# Patient Record
Sex: Male | Born: 1985 | Race: Black or African American | Hispanic: No | Marital: Single | Smoking: Current every day smoker
Health system: Southern US, Community
[De-identification: ages and names within clinical notes are randomized; demographics above are authoritative.]

## PROBLEM LIST (undated history)

## (undated) DIAGNOSIS — Z21 Asymptomatic human immunodeficiency virus [HIV] infection status: Secondary | ICD-10-CM

## (undated) DIAGNOSIS — F32A Depression, unspecified: Secondary | ICD-10-CM

## (undated) DIAGNOSIS — B20 Human immunodeficiency virus [HIV] disease: Secondary | ICD-10-CM

## (undated) DIAGNOSIS — F329 Major depressive disorder, single episode, unspecified: Secondary | ICD-10-CM

## (undated) DIAGNOSIS — F319 Bipolar disorder, unspecified: Secondary | ICD-10-CM

## (undated) HISTORY — PX: LUNG BIOPSY: SHX232

## (undated) NOTE — Unmapped External Note (Signed)
 Formatting of this note might be different from the original. Patient complains of ABD pain with N/V/D since this morning. Patient states that he drank something this morning that upset his stomach.  Electronically signed by Redell Stark, RN at 10/19/2023  3:40 PM PDT

## (undated) NOTE — ED Provider Notes (Signed)
 Formatting of this note is different from the original. [facility] EMERGENCY [address] [place] [address] [phone]  History Chief Complaint  Patient presents with   Vomiting   Diarrhea   Nausea    Patient states he drank something earlier and now has N/V/D.     Dustin Bennett is a 24 y.o. male who presents to the ED with nausea, vomiting,and diarrhea prior to arrival.  Patient states he found a bottle of Dr. Nunzio that was already open and drank it.  Thinks he might have been poisoned.  No blood in emesis or stools.  Has mild abdominal cramping.  Denies drug use or alcohol use.  No recent surgeries.  No chest pain, shortness of breath.  No other symptoms or complaints.  The history is provided by the patient. No language interpreter was used.  Vomiting Severity:  Moderate Timing:  Constant Quality:  Unable to specify Progression:  Unchanged Chronicity:  New Relieved by:  Nothing Worsened by:  Nothing Ineffective treatments:  None tried Associated symptoms: abdominal pain and diarrhea   Associated symptoms: no chills, no cough, no fever and no headaches   Risk factors: suspect food intake   Diarrhea Associated symptoms: abdominal pain and vomiting   Associated symptoms: no chills, no fever and no headaches   Nausea Associated symptoms include abdominal pain, nausea and vomiting. Pertinent negatives include no chest pain, chills, congestion, coughing, fever, headaches, neck pain or rash.   Past Medical History:  Diagnosis Date   Depression    HIV (human immunodeficiency virus infection) (CMS/HCC)    History reviewed. No pertinent surgical history.  History reviewed. No pertinent family history.  Social History   Tobacco Use   Smoking status: Every Day   Smokeless tobacco: Never  Vaping Use   Vaping status: never used  Substance Use Topics   Alcohol use: Not Currently   Drug use: Not Currently   No Known Allergies  Review of Systems   Constitutional:  Negative for chills and fever.  HENT:  Negative for congestion and rhinorrhea.   Respiratory:  Negative for cough and shortness of breath.   Cardiovascular:  Negative for chest pain.  Gastrointestinal:  Positive for abdominal pain, diarrhea, nausea and vomiting.  Musculoskeletal:  Negative for back pain and neck pain.  Skin:  Negative for rash.  Neurological:  Negative for light-headedness and headaches.  All other systems reviewed and are negative.  Physical Exam BP 117/83 (BP Location: Left arm, Patient Position: Sitting)   Pulse (!) 111   Temp 98.3 F (36.8 C) (Oral)   Resp 18   SpO2 96%   Physical Exam Vitals and nursing note reviewed.  Constitutional:      General: He is awake. He is in acute distress (mild).     Appearance: He is well-developed. He is not toxic-appearing.     Comments: (+)  No active vomiting at this time.  (+) Patient seen walking in and out of the waiting room to triage with a steady ambulatory gait.  HENT:     Head: Normocephalic and atraumatic.     Nose: Nose normal.     Mouth/Throat:     Pharynx: Uvula midline. No oropharyngeal exudate.  Eyes:     Conjunctiva/sclera: Conjunctivae normal.  Cardiovascular:     Rate and Rhythm: Regular rhythm. Tachycardia present.     Heart sounds: Normal heart sounds.  Pulmonary:     Effort: Pulmonary effort is normal. No respiratory distress.  Breath sounds: Normal breath sounds. No decreased breath sounds, wheezing, rhonchi or rales.  Chest:     Chest wall: No tenderness.  Abdominal:     General: Bowel sounds are normal. There is no distension.     Palpations: Abdomen is soft.     Tenderness: There is no abdominal tenderness. There is no guarding or rebound.  Musculoskeletal:        General: No tenderness or deformity. Normal range of motion.     Cervical back: Normal range of motion.  Skin:    General: Skin is warm and dry.     Findings: No rash.  Neurological:     Mental Status: He  is alert and oriented to person, place, and time.  Psychiatric:        Mood and Affect: Mood is anxious.   Results Labs Reviewed  URINE DRUG SCREEN - Abnormal      Result Value   Cocaine Negative     Amphetamine Positive (*)    Opiate Negative     Barbiturate Negative     Cannabinoid Positive (*)    Benzodiazepine Negative     Phencyclidine Negative     Fentanyl Screen, Urine Negative     Narrative:    This test provides only a preliminary test result for testing in human urine samples. A more specific alternate chemical method must be used in order to obtain a confirmed analytical result. Gas chromatography/mass spectrometry (GC/MS) is the preferred confirmatory method. Other chemical confirmation Methods are available. Clinical consideration and professional judgment should be applied to any drug abuse test result, particularly when positive results are used. This test is a screening test and should only be used for medical purposes.   DRUG                             CUTOFF  Amphetamine                      1000 ng/mL Cocaine                           300 ng/mL Opiates                           300 ng/mL PCP                                25 ng/mL Barbituates                       200 ng/mL  Benzodiazepine                    200 ng/mL THC                                50 ng/mL Fentanyl                            1 ng/mL  CBC W/ AUTO DIFF  BASIC METABOLIC PANEL  LIPASE, SERUM  ETHANOL   Imaging Results   None   Interpreted by radiologist.   ED Course Procedures  MDM Number of Diagnoses or Management Options Eloped from emergency department  Nausea & vomiting Diagnosis management comments: MDM: The history is provided by the patient.   Dustin Bennett is a 81 y.o. male who presents to the ED with nausea vomiting diarrhea prior to arrival.  Patient states he found a bottle of Dr. Nunzio that was already open and drank it.  Thinks he might have been poisoned.  No  blood in emesis or stools.  Has mild abdominal cramping.  Denies drug use or alcohol use.  No recent surgeries.  No chest pain, shortness of breath.  No other symptoms or complaints.  Physical Exam:  Mild distress.  Anxious.  Soft and nontender abdomen.  No active vomiting at this time.  Patient seen walking in and out of the waiting room to triage with a steady ambulatory gait.  Clear breath sounds, no respiratory distress, mildly tachycardic.  4:13 PM Per records reviewed: patient was last seen in the ED on 03/26/2020 by Hadassah Finder, APRN, dx includes heat exhaustion, hypokalemia.  I initiated the workup on the patient of chief complaint of NVD. We began appropriate case-specific diagnostic work up. While pending, I came back to the pod and saw they had left without waiting. They appeared competent at the time they were seen. I cannot say why they left, but they did without being further evaluated. We will try calling them at their house to better identify why they left before we were able to make a diagnosis.    ED Disposition  Data Unavailable  Final diagnoses:  Nausea & vomiting  Eloped from emergency department   New Prescriptions   No medications on file   Scribe Attestation: Documenting assistance provided by Dustin Bennett, Medical Scribe for Dustin Cruel, APRN   I personally performed the services described in the documentation.  I have reviewed and edited as indicated in the documentation which was dictated to the scribe in my presence.  This documentation accurately records my words and actions.  Dustin Cruel, APRN    Dustin JINNY Cruel, NP 10/19/23 TRENNA  Electronically signed by Prentice FORBES Cone, DO at 10/20/2023  5:18 PM PDT  Associated attestation - Cone Prentice FORBES, DO - 10/20/2023  5:18 PM PDT Formatting of this note might be different from the original. The patient was reviewed with the midlevel provider along with available database at the time of disposition.  I  agree with the dispositon as discussed at the time of our discussion.  AE Cone, DO [facility]

## (undated) NOTE — ED Notes (Signed)
 Formatting of this note might be different from the original. Pt called multiple times from ER lobby, no answer. Pt eloped, provider notified.\ Electronically signed by Arlyne Lanette Breed, RN at 10/19/2023  6:31 PM PDT

---

## 2009-03-10 ENCOUNTER — Emergency Department: Payer: Self-pay | Admitting: Emergency Medicine

## 2009-03-12 ENCOUNTER — Emergency Department: Payer: Self-pay | Admitting: Internal Medicine

## 2014-05-29 ENCOUNTER — Emergency Department: Payer: Self-pay | Admitting: Student

## 2014-05-29 LAB — URINALYSIS, COMPLETE
BLOOD: NEGATIVE
Bacteria: NONE SEEN
Bilirubin,UR: NEGATIVE
Glucose,UR: NEGATIVE mg/dL (ref 0–75)
Ketone: NEGATIVE
LEUKOCYTE ESTERASE: NEGATIVE
Nitrite: NEGATIVE
PROTEIN: NEGATIVE
Ph: 6 (ref 4.5–8.0)
Specific Gravity: 1.023 (ref 1.003–1.030)
Squamous Epithelial: NONE SEEN

## 2014-05-29 LAB — GC/CHLAMYDIA PROBE AMP

## 2014-05-31 LAB — URINE CULTURE

## 2015-07-25 ENCOUNTER — Emergency Department
Admission: EM | Admit: 2015-07-25 | Discharge: 2015-07-25 | Disposition: A | Discharge status: Home / Self Care | Payer: Self-pay | Attending: Emergency Medicine | Admitting: Emergency Medicine

## 2015-07-25 DIAGNOSIS — F19959 Other psychoactive substance use, unspecified with psychoactive substance-induced psychotic disorder, unspecified: Secondary | ICD-10-CM

## 2015-07-25 DIAGNOSIS — Z21 Asymptomatic human immunodeficiency virus [HIV] infection status: Secondary | ICD-10-CM

## 2015-07-25 DIAGNOSIS — F1420 Cocaine dependence, uncomplicated: Secondary | ICD-10-CM

## 2015-07-25 DIAGNOSIS — F1510 Other stimulant abuse, uncomplicated: Secondary | ICD-10-CM

## 2015-07-25 DIAGNOSIS — F17200 Nicotine dependence, unspecified, uncomplicated: Secondary | ICD-10-CM | POA: Insufficient documentation

## 2015-07-25 DIAGNOSIS — F29 Unspecified psychosis not due to a substance or known physiological condition: Secondary | ICD-10-CM | POA: Insufficient documentation

## 2015-07-25 DIAGNOSIS — F1220 Cannabis dependence, uncomplicated: Secondary | ICD-10-CM

## 2015-07-25 DIAGNOSIS — F1290 Cannabis use, unspecified, uncomplicated: Secondary | ICD-10-CM | POA: Insufficient documentation

## 2015-07-25 DIAGNOSIS — F1490 Cocaine use, unspecified, uncomplicated: Secondary | ICD-10-CM | POA: Insufficient documentation

## 2015-07-25 DIAGNOSIS — F22 Delusional disorders: Secondary | ICD-10-CM | POA: Insufficient documentation

## 2015-07-25 LAB — CBC WITH DIFFERENTIAL/PLATELET
BASOS PCT: 1 %
Basophils Absolute: 0 10*3/uL (ref 0–0.1)
EOS ABS: 0.1 10*3/uL (ref 0–0.7)
EOS PCT: 2 %
HCT: 47.8 % (ref 40.0–52.0)
HEMOGLOBIN: 16 g/dL (ref 13.0–18.0)
LYMPHS ABS: 0.7 10*3/uL — AB (ref 1.0–3.6)
Lymphocytes Relative: 24 %
MCH: 30.5 pg (ref 26.0–34.0)
MCHC: 33.4 g/dL (ref 32.0–36.0)
MCV: 91.2 fL (ref 80.0–100.0)
MONOS PCT: 17 %
Monocytes Absolute: 0.5 10*3/uL (ref 0.2–1.0)
NEUTROS PCT: 56 %
Neutro Abs: 1.6 10*3/uL (ref 1.4–6.5)
PLATELETS: 103 10*3/uL — AB (ref 150–440)
RBC: 5.24 MIL/uL (ref 4.40–5.90)
RDW: 13.2 % (ref 11.5–14.5)
WBC: 2.8 10*3/uL — AB (ref 3.8–10.6)

## 2015-07-25 LAB — URINE DRUG SCREEN, QUALITATIVE (ARMC ONLY)
AMPHETAMINES, UR SCREEN: POSITIVE — AB
BENZODIAZEPINE, UR SCRN: NOT DETECTED
Barbiturates, Ur Screen: NOT DETECTED
Cannabinoid 50 Ng, Ur ~~LOC~~: POSITIVE — AB
Cocaine Metabolite,Ur ~~LOC~~: POSITIVE — AB
MDMA (ECSTASY) UR SCREEN: NOT DETECTED
METHADONE SCREEN, URINE: NOT DETECTED
Opiate, Ur Screen: NOT DETECTED
Phencyclidine (PCP) Ur S: NOT DETECTED
TRICYCLIC, UR SCREEN: NOT DETECTED

## 2015-07-25 LAB — URINALYSIS COMPLETE WITH MICROSCOPIC (ARMC ONLY)
BILIRUBIN URINE: NEGATIVE
Bacteria, UA: NONE SEEN
Glucose, UA: NEGATIVE mg/dL
HGB URINE DIPSTICK: NEGATIVE
LEUKOCYTES UA: NEGATIVE
NITRITE: NEGATIVE
PH: 5 (ref 5.0–8.0)
Protein, ur: 30 mg/dL — AB
SPECIFIC GRAVITY, URINE: 1.023 (ref 1.005–1.030)

## 2015-07-25 LAB — COMPREHENSIVE METABOLIC PANEL
ALBUMIN: 4.7 g/dL (ref 3.5–5.0)
ALK PHOS: 63 U/L (ref 38–126)
ALT: 29 U/L (ref 17–63)
ANION GAP: 10 (ref 5–15)
AST: 50 U/L — ABNORMAL HIGH (ref 15–41)
BUN: 15 mg/dL (ref 6–20)
CHLORIDE: 96 mmol/L — AB (ref 101–111)
CO2: 26 mmol/L (ref 22–32)
Calcium: 9.6 mg/dL (ref 8.9–10.3)
Creatinine, Ser: 1.41 mg/dL — ABNORMAL HIGH (ref 0.61–1.24)
GFR calc non Af Amer: >60 mL/min (ref >60)
GLUCOSE: 87 mg/dL (ref 65–99)
Potassium: 3.8 mmol/L (ref 3.5–5.1)
SODIUM: 132 mmol/L — AB (ref 135–145)
Total Bilirubin: 1.2 mg/dL (ref 0.3–1.2)
Total Protein: 9 g/dL — ABNORMAL HIGH (ref 6.5–8.1)

## 2015-07-25 LAB — SALICYLATE LEVEL

## 2015-07-25 LAB — ACETAMINOPHEN LEVEL: Acetaminophen (Tylenol), Serum: <10 ug/mL — ABNORMAL LOW (ref 10–30)

## 2015-07-25 LAB — ETHANOL

## 2015-07-25 MED ORDER — DIPHENHYDRAMINE HCL 50 MG/ML IJ SOLN
25.0000 mg | Freq: Once | INTRAMUSCULAR | Status: AC
  Administered 2015-07-25: 25 mg via INTRAMUSCULAR

## 2015-07-25 MED ORDER — HALOPERIDOL LACTATE 5 MG/ML IJ SOLN
INTRAMUSCULAR | Status: AC
  Administered 2015-07-25: 5 mg via INTRAMUSCULAR
  Filled 2015-07-25: qty 1

## 2015-07-25 MED ORDER — LORAZEPAM 2 MG/ML IJ SOLN
INTRAMUSCULAR | Status: AC
  Administered 2015-07-25: 2 mg via INTRAMUSCULAR
  Filled 2015-07-25: qty 1

## 2015-07-25 MED ORDER — HALOPERIDOL LACTATE 5 MG/ML IJ SOLN
5.0000 mg | Freq: Once | INTRAMUSCULAR | Status: AC
  Administered 2015-07-25: 5 mg via INTRAMUSCULAR

## 2015-07-25 MED ORDER — LORAZEPAM 2 MG/ML IJ SOLN
2.0000 mg | Freq: Once | INTRAMUSCULAR | Status: AC
  Administered 2015-07-25: 2 mg via INTRAMUSCULAR

## 2015-07-25 MED ORDER — DIPHENHYDRAMINE HCL 50 MG/ML IJ SOLN
INTRAMUSCULAR | Status: AC
  Filled 2015-07-25: qty 1

## 2015-07-25 MED ORDER — DIPHENHYDRAMINE HCL 50 MG/ML IJ SOLN: 50.0000 mg | Freq: Once | INTRAMUSCULAR | Status: DC

## 2015-07-25 NOTE — BHH Counselor (Signed)
Pt received B-52 due to agitation and aggressiveness and is now sleeping. TTS unable to complete assessment at this time.

## 2015-07-25 NOTE — Consult Note (Signed)
[facility] Consult   Reason for Consult:  Consult for this 29 year old man brought here under involuntary commitment filed by his mother with allegations of psychosis and psychotic behavior Referring Physician:  Joni Fears Patient Identification: ESTEVAN KERSH MRN:  [id number] Principal Diagnosis: Drug psychosis [facility]) Diagnosis:   Patient Active Problem List   Diagnosis Date Noted  . 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 Marijuana abuse [F12.10] 07/25/2015  . Amphetamine abuse [F15.10] 07/25/2015  . Cocaine abuse [F14.10] 07/25/2015  . HIV positive ([place]) [Z21] 07/25/2015    Total Time spent with patient: 1 hour  Subjective:   ZEPHAN BEAUCHAINE is a 29 y.o. male patient admitted with patient interviewed. Chart reviewed. Commitment paper reviewed. We discussed the situation among the psychiatric team here and that emergency room. This 29 year old man was brought to the emergency room last night on involuntary commitment paperwork filed by his mother. The commitment paperwork alleges that the patient has had some bizarre behavior presumed to be paranoid such as tearing heating and air conditioning units out of the house cutting electrical cord staring siding off the house. It alleges that the family believes that the patient is on drugs. On presentation last night evidently the patient was quite irritable and upset about being here but it's not specifically documented that he said or did anything psychotic. On interview today the patient is rather resistant. He tells me that all he knows is that his mother took out paperwork. Confronted with the contents of the commitment he either says that they are all untrue or he has an excuse for them saying that he thinks that some of the addition adding was not working and he was trying to work on it. Patient states that his mood is been fine. Denies auditory or visual hallucinations. Denies feeling paranoid. Denies any thoughts of  wanting to harm himself or being aggressive to others. He states that he just recently moved back to [place] from [place] about a month ago. Stress from not currently working. Asked about substance abuse he says that he drinks about twice a week not heavily and smokes marijuana 2 or 3 times a week but denies that he is using any other drugs.  Social history: Has been living with his mother. Moved back here about a month ago from [place]. Not currently working. Not in school. Not really forthcoming with any other clear information about his situation.  Medical history: Patient states he is HIV positive. He was seeing a doctor in [place]. He says he is on Atripla monotherapy as treatment for his disease. Denies any other medical problems. Doesn't have a doctor down here in [place] set up yet.  Substance abuse history: As noted above he admits to use of alcohol and marijuana denies other drug use denies believing that he has a substance abuse problem.  HPI:  See note above) history of present illness. Patient was brought in under involuntary commitment stating some bizarre behavior but not clearly stating dangerousness. Since being here he has denied any psychiatric symptoms.  Past Psychiatric History: Patient admits to me that he has seen a psychiatrist before but claims that it was only because he had HIV disease and was being evaluated in the clinic. Denies he's ever been treated for any mental health problems. Denies ever being in a psychiatric hospital. Denies suicide attempts. Denies being on psychiatric medicine.  Risk to Self: Suicidal Ideation: No Suicidal Intent: No Is patient at risk for suicide?:  No Suicidal Plan?: No Access to Means: No What has been your use of drugs/alcohol within the last 12 months?: Alcohol, THC, Cocaine & Ecstasy How many times?: 0 Other Self Harm Risks: Active Addiction Triggers for Past Attempts: None known Intentional Self Injurious  Behavior: None Risk to Others: Homicidal Ideation: No Thoughts of Harm to Others: No Current Homicidal Intent: No Current Homicidal Plan: No Access to Homicidal Means: No Identified Victim: None Reported History of harm to others?: No Assessment of Violence: None Noted Violent Behavior Description: None Reported Does patient have access to weapons?: No Criminal Charges Pending?: No Does patient have a court date: No Prior Inpatient Therapy: Prior Inpatient Therapy: No Prior Therapy Dates: None Reported Prior Therapy Facilty/Provider(s): None Reported Reason for Treatment: None Reported Prior Outpatient Therapy: Prior Outpatient Therapy: Yes Prior Therapy Dates: 07/2014 [facility] Reason for Treatment: Anxiety & Depression due to health Does patient have an ACCT team?: No Does patient have Intensive In-House Services?  : No Does patient have [facility] services? : No Does patient have P4CC services?: No  Past Medical History: History reviewed. No pertinent past medical history. History reviewed. No pertinent past surgical history. Family History: No family history on file. Family Psychiatric  History: Patient says he has an aunt who had mental health problems but he doesn't know anything else about Social History:  History  Alcohol Use  . 3.6 oz/week  . 6 Cans of beer per week     History  Drug Use  . Yes  . Special: Marijuana    Social History   Social History  . Marital Status: Single    Spouse Name: N/A  . Number of Children: N/A  . Years of Education: N/A   Social History Main Topics  . Smoking status: Current Every Day Smoker  . Smokeless tobacco: None  . Alcohol Use: 3.6 oz/week    6 Cans of beer per week  . Drug Use: Yes    Special: Marijuana  . Sexual Activity: Not Asked   Other Topics Concern  . None   Social History Narrative  . None   Additional Social History:    Pain Medications: See  PTA Prescriptions: See PTA Over the Counter: See PTA History of alcohol / drug use?: Yes Longest period of sobriety (when/how long): "5 years" Negative Consequences of Use:  (Reports of having none) Withdrawal Symptoms:  (Reports of having none) Name of Substance 1: THC 1 - Age of First Use: 15 1 - Amount (size/oz): "2 or 3 blunts" 1 - Frequency: 3 days out of the week 1 - Duration: 2 years 1 - Last Use / Amount: 07/24/2015 Name of Substance 2: Alcohol 2 - Age of First Use: 17 2 - Amount (size/oz): "2 8oz cans" 2 - Frequency: "Every other weekend" 2 - Duration: Approximately a year 2 - Last Use / Amount: 07/23/2015 Name of Substance 3: "Molly"-Ecstasy 3 - Age of First Use: 23 3 - Amount (size/oz): "One pill" 3 - Frequency: Used only 10 times in life time 3 - Duration: Used only 10 tiems in life time 3 - Last Use / Amount: 07/11/2015 Name of Substance 4: Cocaine (Powder) 4 - Age of First Use: 16 4 - Amount (size/oz): 1 gram 4 - Frequency: Approximately 2x a month 4 - Duration: "Not that often" 4 - Last Use / Amount: 07/12/2015             Allergies:  No Known Allergies  Labs:  Results for orders placed or performed during the hospital encounter of 07/25/15 (from the past 48 hour(s))  CBC with Differential     Status: Abnormal   Collection Time: 07/25/15  1:15 AM  Result Value Ref Range   WBC 2.8 (L) 3.8 - 10.6 K/uL   RBC 5.24 4.40 - 5.90 MIL/uL   Hemoglobin 16.0 13.0 - 18.0 g/dL   HCT 47.8 40.0 - 52.0 %   MCV 91.2 80.0 - 100.0 fL   MCH 30.5 26.0 - 34.0 pg   MCHC 33.4 32.0 - 36.0 g/dL   RDW 13.2 11.5 - 14.5 %   Platelets 103 (L) 150 - 440 K/uL   Neutrophils Relative % 56 %   Neutro Abs 1.6 1.4 - 6.5 K/uL   Lymphocytes Relative 24 %   Lymphs Abs 0.7 (L) 1.0 - 3.6 K/uL   Monocytes Relative 17 %   Monocytes Absolute 0.5 0.2 - 1.0 K/uL   Eosinophils Relative 2 %   Eosinophils Absolute 0.1 0 - 0.7 K/uL   Basophils Relative 1 %   Basophils Absolute 0.0 0 - 0.1  K/uL  Comprehensive metabolic panel     Status: Abnormal   Collection Time: 07/25/15  1:15 AM  Result Value Ref Range   Sodium 132 (L) 135 - 145 mmol/L   Potassium 3.8 3.5 - 5.1 mmol/L   Chloride 96 (L) 101 - 111 mmol/L   CO2 26 22 - 32 mmol/L   Glucose, Bld 87 65 - 99 mg/dL   BUN 15 6 - 20 mg/dL   Creatinine, Ser 1.41 (H) 0.61 - 1.24 mg/dL   Calcium 9.6 8.9 - 10.3 mg/dL   Total Protein 9.0 (H) 6.5 - 8.1 g/dL   Albumin 4.7 3.5 - 5.0 g/dL   AST 50 (H) 15 - 41 U/L   ALT 29 17 - 63 U/L   Alkaline Phosphatase 63 38 - 126 U/L   Total Bilirubin 1.2 0.3 - 1.2 mg/dL   GFR calc non Af Amer >60 >60 mL/min   GFR calc Af Amer >60 >60 mL/min    Comment: (NOTE) The eGFR has been calculated using the CKD EPI equation. This calculation has not been validated in all clinical situations. eGFR's persistently <60 mL/min signify possible Chronic Kidney Disease.    Anion gap 10 5 - 15  Acetaminophen level     Status: Abnormal   Collection Time: 07/25/15  1:15 AM  Result Value Ref Range   Acetaminophen (Tylenol), Serum <10 (L) 10 - 30 ug/mL    Comment:        THERAPEUTIC CONCENTRATIONS VARY SIGNIFICANTLY. A RANGE OF 10-30 ug/mL MAY BE AN EFFECTIVE CONCENTRATION FOR MANY PATIENTS. HOWEVER, SOME ARE BEST TREATED AT CONCENTRATIONS OUTSIDE THIS RANGE. ACETAMINOPHEN CONCENTRATIONS >150 ug/mL AT 4 HOURS AFTER INGESTION AND >50 ug/mL AT 12 HOURS AFTER INGESTION ARE OFTEN ASSOCIATED WITH TOXIC REACTIONS.   Salicylate level     Status: None   Collection Time: 07/25/15  1:15 AM  Result Value Ref Range   Salicylate Lvl <6.2 2.8 - 30.0 mg/dL  Ethanol     Status: None   Collection Time: 07/25/15  1:15 AM  Result Value Ref Range   Alcohol, Ethyl (B) <5 <5 mg/dL    Comment:        LOWEST DETECTABLE LIMIT FOR SERUM ALCOHOL IS 5 mg/dL FOR MEDICAL PURPOSES ONLY   Urine Drug Screen, Qualitative ([facility] only)     Status: Abnormal   Collection Time: 07/25/15  1:15 AM  Result Value Ref Range    Tricyclic, Ur Screen NONE DETECTED NONE DETECTED   Amphetamines, Ur Screen POSITIVE (A) NONE DETECTED   MDMA (Ecstasy)Ur Screen NONE DETECTED NONE DETECTED   Cocaine Metabolite,Ur [place] POSITIVE (A) NONE DETECTED   Opiate, Ur Screen NONE DETECTED NONE DETECTED   Phencyclidine (PCP) Ur S NONE DETECTED NONE DETECTED   Cannabinoid 50 Ng, Ur [place] POSITIVE (A) NONE DETECTED   Barbiturates, Ur Screen NONE DETECTED NONE DETECTED   Benzodiazepine, Ur Scrn NONE DETECTED NONE DETECTED   Methadone Scn, Ur NONE DETECTED NONE DETECTED    Comment: (NOTE) 568  Tricyclics, urine               Cutoff 1000 ng/mL 200  Amphetamines, urine             Cutoff 1000 ng/mL 300  MDMA (Ecstasy), urine           Cutoff 500 ng/mL 400  Cocaine Metabolite, urine       Cutoff 300 ng/mL 500  Opiate, urine                   Cutoff 300 ng/mL 600  Phencyclidine (PCP), urine      Cutoff 25 ng/mL 700  Cannabinoid, urine              Cutoff 50 ng/mL 800  Barbiturates, urine             Cutoff 200 ng/mL 900  Benzodiazepine, urine           Cutoff 200 ng/mL 1000 Methadone, urine                Cutoff 300 ng/mL 1100 1200 The urine drug screen provides only a preliminary, unconfirmed 1300 analytical test result and should not be used for non-medical 1400 purposes. Clinical consideration and professional judgment should 1500 be applied to any positive drug screen result due to possible 1600 interfering substances. A more specific alternate chemical method 1700 must be used in order to obtain a confirmed analytical result.  1800 Gas chromato graphy / mass spectrometry (GC/MS) is the preferred 1900 confirmatory method.   Urinalysis complete, with microscopic ([facility] only)     Status: Abnormal   Collection Time: 07/25/15  1:15 AM  Result Value Ref Range   Color, Urine YELLOW (A) YELLOW   APPearance CLEAR (A) CLEAR   Glucose, UA NEGATIVE NEGATIVE mg/dL   Bilirubin Urine NEGATIVE NEGATIVE   Ketones, ur TRACE (A) NEGATIVE mg/dL    Specific Gravity, Urine 1.023 1.005 - 1.030   Hgb urine dipstick NEGATIVE NEGATIVE   pH 5.0 5.0 - 8.0   Protein, ur 30 (A) NEGATIVE mg/dL   Nitrite NEGATIVE NEGATIVE   Leukocytes, UA NEGATIVE NEGATIVE   RBC / HPF 0-5 0 - 5 RBC/hpf   WBC, UA 0-5 0 - 5 WBC/hpf   Bacteria, UA NONE SEEN NONE SEEN   Squamous Epithelial / LPF 0-5 (A) NONE SEEN   Mucous PRESENT     No current facility-administered medications for this encounter.   No current outpatient prescriptions on file.    Musculoskeletal: Strength & Muscle Tone: within normal limits Gait & Station: normal Patient leans: N/A  Psychiatric Specialty Exam: Review of Systems  Constitutional: Negative.   HENT: Negative.   Eyes: Negative.   Respiratory: Negative.   Cardiovascular: Negative.   Gastrointestinal: Negative.   Musculoskeletal: Negative.   Skin: Negative.   Neurological: Negative.   Psychiatric/Behavioral: Positive for memory loss and  substance abuse. Negative for depression, suicidal ideas and hallucinations. The patient has insomnia. The patient is not nervous/anxious.     Blood pressure 104/68, pulse 109, temperature 98.2 F (36.8 C), temperature source Oral, resp. rate 18, height _0  (1.803 m), weight 86.183 kg (190 lb), SpO2 97 %.Body mass index is 26.51 kg/(m^2).  General Appearance: Disheveled  Eye Contact::  Minimal  Speech:  Slow  Volume:  Decreased  Mood:  Irritable  Affect:  Constricted  Thought Process:  Goal Directed  Orientation:  Full (Time, Place, and Person)  Thought Content:  Negative  Suicidal Thoughts:  No  Homicidal Thoughts:  No  Memory:  Immediate;   Good Recent;   Fair Remote;   Fair  Judgement:  Fair  Insight:  Fair  Psychomotor Activity:  Psychomotor Retardation  Concentration:  Fair  Recall:  [employer] of Knowledge:Fair  Language: Fair  Akathisia:  No  Handed:  Right  AIMS (if indicated):     Assets:  Housing Leisure Time Resilience  ADL's:  Intact  Cognition: WNL   Sleep:      Treatment Plan Summary: Plan Patient presented on involuntary commitment. Commitment does not allege any specific violence or suicidality. Patient has denied psychiatric symptoms since being in the emergency room. He is irritable and somewhat resistant and it certainly could be the case that he is concealing some paranoia. One thing we do know is that his drug screen is positive for both amphetamine and cocaine both of which she denied to me in both of which could cause transient psychotic agitated behavior. At this point I don't think we have grounds to hold the involuntary commitment. We have tried to reach his mother by the telephone number on the commitment paperwork and have been unsuccessful as it says that her mailbox is full and we can't leave a message. At this point patient does not appear to meet commitment criteria. He has been educated about the dangers of continued substance abuse on his mental state and strongly encouraged to discontinue drug abuse and to get involved in treatment. No other medication to be prescribed at this time. Case reviewed with the ER doctor. He can be released with a referral to local mental health.  Disposition: Patient does not meet criteria for psychiatric inpatient admission. Supportive therapy provided about ongoing stressors.  John Clapacs 07/25/2015 1:58 PM

## 2015-07-25 NOTE — ED Notes (Signed)
BEHAVIORAL HEALTH ROUNDING  Patient sleeping: Yes.  Patient alert and oriented: no  Behavior appropriate: Yes. ; If no, describe:  Nutrition and fluids offered: No  Toileting and hygiene offered: No  Sitter present: no  Law enforcement present: Yes   

## 2015-07-25 NOTE — ED Provider Notes (Signed)
-----------------------------------------   2:38 PM on 07/25/2015 -----------------------------------------  Patient remains medically stable in the emergency department. Awake alert oriented feeling better, calm comfortable and appropriate behavior. Tolerating oral intake. Evaluated by Dr. Mat Carnelay packs of psychiatry in the emergency department, I discussed the patient's case with Dr. Mat Carnelay packs after that evaluation, he feels that the patient is psychiatrically stable and feels that he had a transient drug-induced psychosis that is now resolved and the patient is otherwise psychiatrically stable and not a threat to himself or others and suitable for outpatient follow-up. Patient also has HIV reports having access to his medications and being informed as to the nature of his disease and medical management. His blood counts are on the lower side today, so I encouraged him to follow up with his doctor for continued monitoring of his symptoms. No evidence of serious infection at this time. We'll plan to discharge the patient home with follow-up information.  Sharman CheekPhillip Eriverto Byrnes, MD 07/25/15 [phone]

## 2015-07-25 NOTE — Discharge Instructions (Signed)
You were evaluated in the ER today for unusual behavior.  This appears to have resulted from using multiple drugs.  Avoid using drugs in the future and follow up with a mental health clinic for continued monitoring of your symptoms.  Your blood cell counts were also low today.  Please follow up with your primary care doctor or hematologist for continued monitoring of your condition. Take all your prescribed home medications.   Cannabis Use Disorder Cannabis use disorder is a mental disorder. It is not one-time or occasional use of cannabis, more commonly known as marijuana. Cannabis use disorder is the continued, nonmedical use of cannabis that interferes with normal life activities or causes health problems. People with cannabis use disorder get a feeling of extreme pleasure and relaxation from cannabis use. This "high" is very rewarding and causes people to use over and over.  The mind-altering ingredient in cannabis is know as THC. THC can also interfere with motor coordination, memory, judgment, and accurate sense of space and time. These effects can last for a few days after using cannabis. Regular heavy cannabis use can cause long-lasting problems with thinking and learning. In young people, these problems may be permanent. Cannabis sometimes causes severe anxiety, paranoia, or visual hallucinations. Man-made (synthetic) cannabis-like drugs, such as "spice" and "K2," cause the same effects as THC but are much stronger. Cannabis-like drugs can cause dangerously high blood pressure and heart rate.  Cannabis use disorder usually starts in the teenage years. It can trigger the development of schizophrenia. It is somewhat more common in men than women. People who have family members with the disorder or existing mental health issues such as depression and posttraumatic stress disorderare more likely to develop cannabis use disorder. People with cannabis use disorder are at higher risk for use of other drugs  of abuse.  SIGNS AND SYMPTOMS Signs and symptoms of cannabis use disorder include:   Use of cannabis in larger amounts or over a longer period than intended.   Unsuccessful attempts to cut down or control cannabis use.   A lot of time spent obtaining, using, or recovering from the effects of cannabis.   A strong desire or urge to use cannabis (cravings).   Continued use of cannabis in spite of problems at work, school, or home because of use.   Continued use of cannabis in spite of relationship problems because of use.  Giving up or cutting down on important life activities because of cannabis use.  Use of cannabis over and over even in situations when it is physically hazardous, such as when driving a car.   Continued use of cannabis in spite of a physical problem that is likely related to use. Physical problems can include:  Chronic cough.  Bronchitis.  Emphysema.  Throat and lung cancer.  Continued use of cannabis in spite of a mental problem that is likely related to use. Mental problems can include:  Psychosis.  Anxiety.  Difficulty sleeping.  Need to use more and more cannabis to get the same effect, or lessened effect over time with use of the same amount (tolerance).  Having withdrawal symptoms when cannabis use is stopped, or using cannabis to reduce or avoid withdrawal symptoms. Withdrawal symptoms include:  Irritability or anger.  Anxiety or restlessness.  Difficulty sleeping.  Loss of appetite or weight.  Aches and pains.  Shakiness.  Sweating.  Chills. DIAGNOSIS Cannabis use disorder is diagnosed by your health care provider. You may be asked questions about your cannabis  use and how it affects your life. A physical exam may be done. A drug screen may be done. You may be referred to a mental health professional. The diagnosis of cannabis use disorder requires at least two symptoms within 12 months. The type of cannabis use disorder you have  depends on the number of symptoms you have. The type may be:  Mild. Two or three signs and symptoms.   Moderate. Four or five signs and symptoms.   Severe. Six or more signs and symptoms.  TREATMENT Treatment is usually provided by mental health professionals with training in substance use disorders. The following options are available:  Counseling or talk therapy. Talk therapy addresses the reasons you use cannabis. It also addresses ways to keep you from using again. The goals of talk therapy include:  Identifying and avoiding triggers for use.  Learning how to handle cravings.  Replacing use with healthy activities.  Support groups. Support groups provide emotional support, advice, and guidance.  Medicine. Medicine is used to treat mental health issues that trigger cannabis use or that result from it. HOME CARE INSTRUCTIONS  Take medicines only as directed by your health care provider.  Check with your health care provider before starting any new medicines.  Keep all follow-up visits as directed by your health care provider. SEEK MEDICAL CARE IF:  You are not able to take your medicines as directed.  Your symptoms get worse. SEEK IMMEDIATE MEDICAL CARE IF: You have serious thoughts about hurting yourself or others. FOR MORE INFORMATION  National Institute on Drug Abuse: http://www.price-smith.com/www.drugabuse.gov  Substance Abuse and Mental Health Services Administration: SkateOasis.com.ptwww.samhsa.gov   This information is not intended to replace advice given to you by your health care provider. Make sure you discuss any questions you have with your health care provider.   Document Released: 07/09/2000 Document Revised: 08/02/2014 Document Reviewed: 07/25/2013 Elsevier Interactive Patient Education [employer]2016 Elsevier Inc.

## 2015-07-25 NOTE — ED Notes (Signed)
Pt. Very anger when first put into room.  Multiple officers and staff with patient.  Patient very concerned about personal items he had with him.  Pt. Given ativan, benadryl and haldol.  After given medication, patient agreed to change into scrubs.

## 2015-07-25 NOTE — ED Notes (Signed)
Patient has been sleeping soundly all morning. Now up and eating breakfast. Patient states he feels better. No other needs at this time.
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
## 2015-07-25 NOTE — ED Notes (Signed)
Patient brought to ED with IVC papers that states he is hallucinating and hearing voices. Has removed all siding from trailer, taken heating units out as well as AC and cut the cords. Patient is angry that he is here.
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
## 2015-07-25 NOTE — ED Notes (Signed)
Pt. States he is just visiting family in [place],"I have been here for a month".  Pt. States residence is elsewhere.

## 2015-09-21 ENCOUNTER — Encounter: Payer: Self-pay | Admitting: *Deleted

## 2015-09-21 ENCOUNTER — Emergency Department
Admission: EM | Admit: 2015-09-21 | Discharge: 2015-09-21 | Disposition: A | Discharge status: Home / Self Care | Payer: Self-pay | Attending: Emergency Medicine | Admitting: Emergency Medicine

## 2015-09-21 DIAGNOSIS — A0811 Acute gastroenteropathy due to Norwalk agent: Secondary | ICD-10-CM | POA: Insufficient documentation

## 2015-09-21 DIAGNOSIS — K029 Dental caries, unspecified: Secondary | ICD-10-CM | POA: Insufficient documentation

## 2015-09-21 DIAGNOSIS — K047 Periapical abscess without sinus: Secondary | ICD-10-CM | POA: Insufficient documentation

## 2015-09-21 DIAGNOSIS — F17200 Nicotine dependence, unspecified, uncomplicated: Secondary | ICD-10-CM | POA: Insufficient documentation

## 2015-09-21 LAB — RAPID INFLUENZA A&B ANTIGENS: Influenza A (ARMC): NEGATIVE

## 2015-09-21 LAB — RAPID INFLUENZA A&B ANTIGENS (ARMC ONLY): INFLUENZA B (ARMC): NEGATIVE

## 2015-09-21 MED ORDER — PENICILLIN V POTASSIUM 500 MG PO TABS: 500.0000 mg | ORAL_TABLET | Freq: Four times a day (QID) | ORAL | Status: DC

## 2015-09-21 MED ORDER — ACETAMINOPHEN 500 MG PO TABS
ORAL_TABLET | ORAL | Status: AC
  Filled 2015-09-21: qty 2

## 2015-09-21 MED ORDER — ACETAMINOPHEN 500 MG PO TABS
1000.0000 mg | ORAL_TABLET | Freq: Once | ORAL | Status: AC
  Administered 2015-09-21: 1000 mg via ORAL

## 2015-09-21 MED ORDER — ONDANSETRON 4 MG PO TBDP: 4.0000 mg | ORAL_TABLET | Freq: Four times a day (QID) | ORAL | Status: DC | PRN

## 2015-09-21 MED ORDER — ONDANSETRON 4 MG PO TBDP
4.0000 mg | ORAL_TABLET | Freq: Once | ORAL | Status: AC
  Administered 2015-09-21: 4 mg via ORAL
  Filled 2015-09-21: qty 1

## 2015-09-21 NOTE — ED Provider Notes (Signed)
[facility] Emergency Department Provider Note ____________________________________________  Time seen: 1915  I have reviewed the triage vital signs and the nursing notes.  HISTORY  Chief Complaint  Influenza  HPI Dustin Bennett is a 30 y.o. male presents to the ED for evaluation of generalized symptoms include fatigue, she reveals, and body aches. He denies any significant cough, congestion, or URI symptoms. He describes the onset of his symptoms about 4 days prior. Since that time he's had multiple episodes of diarrhea on yesterday and notes a severely decreased appetite. He has been able to tolerate sips of Gatorade but denies any appetite for solid foods. He also does not endorse any significant vomiting in association with this nausea.Denies any sick contacts, recent travel, or bad food. Secondarily he also has some concerns over a recently fractured lower left jaw molar and notes some intermittent gum swelling and pain in the last few days.  No past medical history on file.  Patient Active Problem List   Diagnosis Date Noted  . 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 Marijuana abuse 07/25/2015  . 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 Cocaine abuse 07/25/2015  . HIV positive (HCC) 07/25/2015    No past surgical history on file.  Current Outpatient Rx  Name  Route  Sig  Dispense  Refill  . ondansetron (ZOFRAN ODT) 4 MG disintegrating tablet   Oral   Take 1 tablet (4 mg total) by mouth every 6 (six) hours as needed for nausea or vomiting.   15 tablet   0   . penicillin v potassium (VEETID) 500 MG tablet   Oral   Take 1 tablet (500 mg total) by mouth 4 (four) times daily.   40 tablet   0    Allergies Review of patient's allergies indicates no known allergies.  No family history on file.  Social History Social History  Substance Use Topics  . Smoking status: Current Every Day Smoker  . Smokeless tobacco: None  . Alcohol Use: 3.6 oz/week    6 Cans of  beer per week   Review of Systems  Constitutional: Positive for fever and chills Eyes: Negative for visual changes. ENT: Negative for sore throat. Dental pain as above. Cardiovascular: Negative for chest pain. Respiratory: Negative for shortness of breath. Gastrointestinal: Negative for abdominal pain, vomiting.  Reports diarrhea. Genitourinary: Negative for dysuria. Musculoskeletal: Negative for back pain. Skin: Negative for rash. Neurological: Negative for headaches, focal weakness or numbness. ____________________________________________  PHYSICAL EXAM:  VITAL SIGNS: ED Triage Vitals  Enc Vitals Group     BP 09/21/15 1728 115/70 mmHg     Pulse Rate 09/21/15 1728 104     Resp 09/21/15 1728 20     Temp 09/21/15 1728 100.9 F (38.3 C)     Temp Source 09/21/15 1728 Oral     SpO2 09/21/15 1728 97 %     Weight 09/21/15 1728 170 lb (77.111 kg)     Height 09/21/15 1728  (1.803 m)     Head Cir --      [place] Flow --      Pain Score 09/21/15 1730 8     Pain Loc --      Pain Edu? --      Excl. in GC? --    Constitutional: Alert and oriented. Well appearing and in no distress. Head: Normocephalic and atraumatic.      Eyes: Conjunctivae are normal. PERRL. Normal extraocular movements      Ears: Canals clear.  TMs intact bilaterally.   Nose: No congestion/rhinorrhea.   Mouth/Throat: Mucous membranes are moist. Uvula is midline and tonsils are flat without erythema, edema, or exudate. Patient with a lower left first molar noted to be decayed and broken down to the gumline. No appreciable gum swelling, fluctuance, or pointing abscess is noted at this time.   Neck: Supple. No thyromegaly. Hematological/Lymphatic/Immunological: No cervical lymphadenopathy. Cardiovascular: Normal rate, regular rhythm.  Respiratory: Normal respiratory effort. No wheezes/rales/rhonchi. Gastrointestinal: Soft and nontender. No distention, rebound, guarding, or rigidity. Hyperactive bowel  sounds. Musculoskeletal: Nontender with normal range of motion in all extremities.  Neurologic:  Normal gait without ataxia. Normal speech and language. No gross focal neurologic deficits are appreciated. Skin:  Skin is warm, dry and intact. No rash noted. Psychiatric: Mood and affect are normal. Patient exhibits appropriate insight and judgment. ____________________________________________   LABS (pertinent positives/negatives) Labs Reviewed  RAPID INFLUENZA A&B ANTIGENS ([facility] ONLY)   Rapid Influenza - Negative ____________________________________________  PROCEDURES  Pen VK 500 mg PO Zofran 4 mg ODT ____________________________________________  INITIAL IMPRESSION / ASSESSMENT AND PLAN / ED COURSE  Patient symptoms that appear to be more consistent with a viral gastroenteritis that an acute URI secondary to influenza. Patient denies any sinus respiratory symptoms since the onset. He will be treated symptomatically for viral gastroenteritis assumed to be due to Norwalk-like agent. He is also provided with an antibiotic course for a recent dental fracture secondary to dental caries. He will also dose of Zofran as needed for nausea and vomiting. He is advised to continue to hydrate himself to prevent dehydration. He will follow up with one of the local committee clinic for medical care and one of the dental clinics for definitive management of his fractured tooth. Return precautions are reviewed. ____________________________________________  FINAL CLINICAL IMPRESSION(S) / ED DIAGNOSES  Final diagnoses:  Viral gastroenteritis due to [place]walk-like agent  Dental infection  Dental caries extending into pulp      Lissa Hoard, PA-C 09/21/15 2132  Sharyn Creamer, MD 09/21/15 2359

## 2015-09-21 NOTE — ED Notes (Signed)
Flu-like sxs started 4 days ago. Generalized body aches and cold chills.  Fever started today. Diarrhea multiple times yesterday.  Been able to drink gatorade but not able to eat much.

## 2015-09-21 NOTE — ED Notes (Signed)
Patient with no complaints at this time. Respirations even and unlabored. Skin warm/dry. Discharge instructions reviewed with patient at this time. Patient given opportunity to voice concerns/ask questions. Patient discharged at this time and left Emergency Department with steady gait.   

## 2015-09-21 NOTE — ED Notes (Signed)
Pt reports bodyaches, chills, fever for 1 day.  No n/v/d.  Pt alert.

## 2015-09-21 NOTE — Discharge Instructions (Signed)
Dental Abscess A dental abscess is pus in or around a tooth. HOME CARE  Take medicines only as told by your dentist.  If you were prescribed antibiotic medicine, finish all of it even if you start to feel better.  Rinse your mouth (gargle) often with salt water.  Do not drive or use heavy machinery, like a lawn mower, while taking pain medicine.  Do not apply heat to the outside of your mouth.  Keep all follow-up visits as told by your dentist. This is important. GET HELP IF:  Your pain is worse, and medicine does not help. GET HELP RIGHT AWAY IF:  You have a fever or chills.  Your symptoms suddenly get worse.  You have a very bad headache.  You have problems breathing or swallowing.  You have trouble opening your mouth.  You have puffiness (swelling) in your neck or around your eye.   This information is not intended to replace advice given to you by your health care provider. Make sure you discuss any questions you have with your health care provider.   Document Released: 11/26/2014 Document Reviewed: 11/26/2014 Elsevier Interactive Patient Education 2016 [employer].  Dental Care and Dentist Visits Dental care supports good overall health. Regular dental visits can also help you avoid dental pain, bleeding, infection, and other more serious health problems in the future. It is important to keep the mouth healthy because diseases in the teeth, gums, and other oral tissues can spread to other areas of the body. Some problems, such as diabetes, heart disease, and pre-term labor have been associated with poor oral health.  See your dentist every 6 months. If you experience emergency problems such as a toothache or broken tooth, go to the dentist right away. If you see your dentist regularly, you may catch problems early. It is easier to be treated for problems in the early stages.  WHAT TO EXPECT AT A DENTIST VISIT  Your dentist will look for many common oral health problems  and recommend proper treatment. At your regular dental visit, you can expect:  Gentle cleaning of the teeth and gums. This includes scraping and polishing. This helps to remove the sticky substance around the teeth and gums (plaque). Plaque forms in the mouth shortly after eating. Over time, plaque hardens on the teeth as tartar. If tartar is not removed regularly, it can cause problems. Cleaning also helps remove stains.  Periodic X-rays. These pictures of the teeth and supporting bone will help your dentist assess the health of your teeth.  Periodic fluoride treatments. Fluoride is a natural mineral shown to help strengthen teeth. Fluoride treatmentinvolves applying a fluoride gel or varnish to the teeth. It is most commonly done in children.  Examination of the mouth, tongue, jaws, teeth, and gums to look for any oral health problems, such as:  Cavities (dental caries). This is decay on the tooth caused by plaque, sugar, and acid in the mouth. It is best to catch a cavity when it is small.  Inflammation of the gums caused by plaque buildup (gingivitis).  Problems with the mouth or malformed or misaligned teeth.  Oral cancer or other diseases of the soft tissues or jaws. KEEP YOUR TEETH AND GUMS HEALTHY For healthy teeth and gums, follow these general guidelines as well as your dentist's specific advice:  Have your teeth professionally cleaned at the dentist every 6 months.  Brush twice daily with a fluoride toothpaste.  Floss your teeth daily.  Ask your dentist if  you need fluoride supplements, treatments, or fluoride toothpaste.  Eat a healthy diet. Reduce foods and drinks with added sugar.  Avoid smoking. TREATMENT FOR ORAL HEALTH PROBLEMS If you have oral health problems, treatment varies depending on the conditions present in your teeth and gums.  Your caregiver will most likely recommend good oral hygiene at each visit.  For cavities, gingivitis, or other oral health  disease, your caregiver will perform a procedure to treat the problem. This is typically done at a separate appointment. Sometimes your caregiver will refer you to another dental specialist for specific tooth problems or for surgery. SEEK IMMEDIATE DENTAL CARE IF:  You have pain, bleeding, or soreness in the gum, tooth, jaw, or mouth area.  A permanent tooth becomes loose or separated from the gum socket.  You experience a blow or injury to the mouth or jaw area.   This information is not intended to replace advice given to you by your health care provider. Make sure you discuss any questions you have with your health care provider.   Document Released: 03/24/2011 Document Revised: 10/04/2011 Document Reviewed: 03/24/2011 Elsevier Interactive Patient Education 2016 [employer].  Norovirus Infection A norovirus infection is caused by exposure to a virus in a group of similar viruses (noroviruses). This type of infection causes inflammation in your stomach and intestines (gastroenteritis). Norovirus is the most common cause of gastroenteritis. It also causes food poisoning. Anyone can get a norovirus infection. It spreads very easily (contagious). You can get it from contaminated food, water, surfaces, or other people. Norovirus is found in the stool or vomit of infected people. You can spread the infection as soon as you feel sick until 2 weeks after you recover.  Symptoms usually begin within 2 days after you become infected. Most norovirus symptoms affect the digestive system. CAUSES Norovirus infection is caused by contact with norovirus. You can catch norovirus if you:  Eat or drink something contaminated with norovirus.  Touch surfaces or objects contaminated with norovirus and then put your hand in your mouth.  Have direct contact with an infected person who has symptoms.  Share food, drink, or utensils with someone with who is sick with norovirus. SIGNS AND SYMPTOMS Symptoms of  norovirus may include:  Nausea.  Vomiting.  Diarrhea.  Stomach cramps.  Fever.  Chills.  Headache.  Muscle aches.  Tiredness. DIAGNOSIS Your health care provider may suspect norovirus based on your symptoms and physical exam. Your health care provider may also test a sample of your stool or vomit for the virus.  TREATMENT There is no specific treatment for norovirus. Most people get better without treatment in about 2 days. HOME CARE INSTRUCTIONS  Replace lost fluids by drinking plenty of water or rehydration fluids containing important minerals called electrolytes. This prevents dehydration. Drink enough fluid to keep your urine clear or pale yellow.  Do not prepare food for others while you are infected. Wait at least 3 days after recovering from the illness to do that. PREVENTION   Wash your hands often, especially after using the toilet or changing a diaper.  Wash fruits and vegetables thoroughly before preparing or serving them.  Throw out any food that a sick person may have touched.  Disinfect contaminated surfaces immediately after someone in the household has been sick. Use a bleach-based household cleaner.  Immediately remove and wash soiled clothes or sheets. SEEK MEDICAL CARE IF:  Your vomiting, diarrhea, and stomach pain is getting worse.  Your symptoms of norovirus do  not go away after 2-3 days. SEEK IMMEDIATE MEDICAL CARE IF:  You develop symptoms of dehydration that do not improve with fluid replacement. This may include:  Excessive sleepiness.  Lack of tears.  Dry mouth.  Dizziness when standing.  Weak pulse.   This information is not intended to replace advice given to you by your health care provider. Make sure you discuss any questions you have with your health care provider.   Document Released: 10/02/2002 Document Revised: 08/02/2014 Document Reviewed: 12/20/2013 Elsevier Interactive Patient Education [employer]  Take the  antibiotic until completed. Take the nausea medicine as needed. Follow-up with one of the dental clinics listed below.  OPTIONS FOR DENTAL FOLLOW UP CARE  [place] [facility] and [facility] - Local Safety Net Dental Clinics [email or web address]   [facility] [phone])  Sharl Ma [phone])  [place] [phone])  [facility] [phone])  [facility] [phone]) This clinic caters to the indigent population and is on a lottery system. Location: [employer] of Dentistry, [employer], [address] [place] Clinic Hours: Wednesdays from 6pm - 9pm, patients seen by a lottery system. For dates, call or go to [email or web address] Services: Cleanings, fillings and simple extractions. Payment Options: DENTAL WORK IS FREE OF CHARGE. Bring proof of income or support. Best way to get seen: Arrive at 5:15 pm - this is a lottery, NOT first come/first serve, so arriving earlier will not increase your chances of being seen.     [facility] [phone] Select option 1 for emergencies   Location: [facility], [place], [address], [place] Clinic Hours: No walk-ins accepted - call the day before to schedule an appointment. Check in times are 9:30 am and 1:30 pm. Services: Simple extractions, temporary fillings, pulpectomy/pulp debridement, uncomplicated abscess drainage. Payment Options: PAYMENT IS DUE AT THE TIME OF SERVICE.  Fee is usually $100-200, additional surgical procedures (e.g. abscess drainage) may be extra. Cash, checks, Visa/MasterCard accepted.  Can file Medicaid if patient is covered for dental - patient should call case worker to check. No discount for [facility] patients. Best way to get seen: MUST call the day before and get onto the schedule.  Can usually be seen the next 1-2 days. No walk-ins accepted.     [facility] [phone]   Location: [facility], [address], [place] Clinic Hours: M, W, Th, F 8am or 1:30pm, Tues 9a or 1:30 - first come/first served. Services: Simple extractions, temporary fillings, uncomplicated abscess drainage.  You do not need to be an [facility] resident. Payment Options: PAYMENT IS DUE AT THE TIME OF SERVICE. Dental insurance, otherwise sliding scale - bring proof of income or support. Depending on income and treatment needed, cost is usually $50-200. Best way to get seen: Arrive early as it is first come/first served.     [facility] [phone]   Location: [address] Clinic Hours: Mon-Thu 8a-5p Services: Most basic dental services including extractions and fillings. Payment Options: PAYMENT IS DUE AT THE TIME OF SERVICE. Sliding scale, up to 50% off - bring proof if income or support. Medicaid with dental option accepted. Best way to get seen: Call to schedule an appointment, can usually be seen within 2 weeks OR they will try to see walk-ins - show up at 8a or 2p (you may have to wait).  [facility] [phone] [place] RESIDENTS ONLY   Location: [facility], [address] [place], [place] [address] Clinic Hours: By appointment only. Monday - Thursday 8am-5pm, Friday 8am-12pm Services: Cleanings, fillings, extractions. Payment Options: PAYMENT IS DUE AT THE TIME OF SERVICE. Cash, Visa or MasterCard. Sliding scale - $30 minimum per service. Best way to get seen: Come in to office, complete packet and make an appointment - need proof of income or support monies for each household member and proof of [facility] residence. Usually takes about a month to get in.     [facility] [phone]    Location: [address] [facility] Clinic Hours: Walk-in Urgent Care Dental Services are offered Monday-Friday mornings only. The numbers of emergencies accepted daily is limited to the number of providers available. Maximum 15 - Mondays, Wednesdays & Thursdays Maximum 10 - Tuesdays & Fridays Services: You do not need to be a [facility] resident to be seen for a dental emergency. Emergencies are defined as pain, swelling, abnormal bleeding, or dental trauma. Walkins will receive x-rays if needed. NOTE: Dental cleaning is not an emergency. Payment Options: PAYMENT IS DUE AT THE TIME OF SERVICE. Minimum co-pay is $40.00 for uninsured patients. Minimum co-pay is $3.00 for Medicaid with dental coverage. Dental Insurance is accepted and must be presented at time of visit. Medicare does not cover dental. Forms of payment: Cash, credit card, checks. Best way to get seen: If not previously registered with the clinic, walk-in dental registration begins at 7:15 am and is on a first come/first serve basis. If previously registered with the clinic, call to make an appointment.     [facility] [phone] [place] RESIDENTS ONLY   Location: [address] [place], [place] Clinic Hours: Mon-Thu 10a-2p Services: Extractions only! Payment Options: FREE (donations accepted) - bring proof of income or support Best way to get seen: Call and schedule an appointment OR come at 8am on the 1st Monday of every month (except for holidays) when it is first come/first served.     [facility] [phone]   Location: [address] [place], [place] Clinic Hours: Friday mornings Services, Payment Options, Best way to get seen: Call for info

## 2015-11-06 ENCOUNTER — Emergency Department
Admission: EM | Admit: 2015-11-06 | Discharge: 2015-11-06 | Disposition: A | Discharge status: Home / Self Care | Payer: Self-pay | Attending: Emergency Medicine | Admitting: Emergency Medicine

## 2015-11-06 ENCOUNTER — Emergency Department: Payer: Self-pay

## 2015-11-06 DIAGNOSIS — F1410 Cocaine abuse, uncomplicated: Secondary | ICD-10-CM | POA: Insufficient documentation

## 2015-11-06 DIAGNOSIS — Z23 Encounter for immunization: Secondary | ICD-10-CM | POA: Insufficient documentation

## 2015-11-06 DIAGNOSIS — F32A Depression, unspecified: Secondary | ICD-10-CM

## 2015-11-06 DIAGNOSIS — F19951 Other psychoactive substance use, unspecified with psychoactive substance-induced psychotic disorder with hallucinations: Secondary | ICD-10-CM

## 2015-11-06 DIAGNOSIS — L02419 Cutaneous abscess of limb, unspecified: Secondary | ICD-10-CM

## 2015-11-06 DIAGNOSIS — F329 Major depressive disorder, single episode, unspecified: Secondary | ICD-10-CM | POA: Insufficient documentation

## 2015-11-06 DIAGNOSIS — L0291 Cutaneous abscess, unspecified: Secondary | ICD-10-CM

## 2015-11-06 DIAGNOSIS — B20 Human immunodeficiency virus [HIV] disease: Secondary | ICD-10-CM | POA: Insufficient documentation

## 2015-11-06 DIAGNOSIS — F1210 Cannabis abuse, uncomplicated: Secondary | ICD-10-CM | POA: Insufficient documentation

## 2015-11-06 DIAGNOSIS — F1910 Other psychoactive substance abuse, uncomplicated: Secondary | ICD-10-CM | POA: Insufficient documentation

## 2015-11-06 DIAGNOSIS — L02414 Cutaneous abscess of left upper limb: Secondary | ICD-10-CM | POA: Insufficient documentation

## 2015-11-06 DIAGNOSIS — F1510 Other stimulant abuse, uncomplicated: Secondary | ICD-10-CM | POA: Diagnosis present

## 2015-11-06 DIAGNOSIS — F1994 Other psychoactive substance use, unspecified with psychoactive substance-induced mood disorder: Secondary | ICD-10-CM

## 2015-11-06 DIAGNOSIS — F1420 Cocaine dependence, uncomplicated: Secondary | ICD-10-CM | POA: Diagnosis present

## 2015-11-06 DIAGNOSIS — F17200 Nicotine dependence, unspecified, uncomplicated: Secondary | ICD-10-CM | POA: Insufficient documentation

## 2015-11-06 DIAGNOSIS — F1220 Cannabis dependence, uncomplicated: Secondary | ICD-10-CM | POA: Diagnosis present

## 2015-11-06 HISTORY — DX: Asymptomatic human immunodeficiency virus (hiv) infection status: Z21

## 2015-11-06 HISTORY — DX: Human immunodeficiency virus (HIV) disease: B20

## 2015-11-06 LAB — COMPREHENSIVE METABOLIC PANEL
ALT: 21 U/L (ref 17–63)
ANION GAP: 6 (ref 5–15)
AST: 6 U/L — ABNORMAL LOW (ref 15–41)
Albumin: 4 g/dL (ref 3.5–5.0)
Alkaline Phosphatase: 56 U/L (ref 38–126)
BUN: 7 mg/dL (ref 6–20)
CHLORIDE: 102 mmol/L (ref 101–111)
CO2: 26 mmol/L (ref 22–32)
Calcium: 9 mg/dL (ref 8.9–10.3)
Creatinine, Ser: 1.02 mg/dL (ref 0.61–1.24)
GFR calc non Af Amer: >60 mL/min (ref >60)
Glucose, Bld: 80 mg/dL (ref 65–99)
POTASSIUM: 3.1 mmol/L — AB (ref 3.5–5.1)
SODIUM: 134 mmol/L — AB (ref 135–145)
Total Bilirubin: 0.6 mg/dL (ref 0.3–1.2)
Total Protein: 8.3 g/dL — ABNORMAL HIGH (ref 6.5–8.1)

## 2015-11-06 LAB — URINALYSIS COMPLETE WITH MICROSCOPIC (ARMC ONLY)
Bilirubin Urine: NEGATIVE
Glucose, UA: NEGATIVE mg/dL
Hgb urine dipstick: NEGATIVE
KETONES UR: NEGATIVE mg/dL
Leukocytes, UA: NEGATIVE
Nitrite: NEGATIVE
PROTEIN: NEGATIVE mg/dL
RBC / HPF: NONE SEEN RBC/hpf (ref 0–5)
Specific Gravity, Urine: 1.004 — ABNORMAL LOW (ref 1.005–1.030)
pH: 6 (ref 5.0–8.0)

## 2015-11-06 LAB — URINE DRUG SCREEN, QUALITATIVE (ARMC ONLY)
AMPHETAMINES, UR SCREEN: NOT DETECTED
BENZODIAZEPINE, UR SCRN: NOT DETECTED
Barbiturates, Ur Screen: NOT DETECTED
Cannabinoid 50 Ng, Ur ~~LOC~~: POSITIVE — AB
Cocaine Metabolite,Ur ~~LOC~~: POSITIVE — AB
MDMA (ECSTASY) UR SCREEN: NOT DETECTED
METHADONE SCREEN, URINE: NOT DETECTED
Opiate, Ur Screen: NOT DETECTED
PHENCYCLIDINE (PCP) UR S: NOT DETECTED
TRICYCLIC, UR SCREEN: NOT DETECTED

## 2015-11-06 LAB — CBC WITH DIFFERENTIAL/PLATELET
Basophils Absolute: 0 10*3/uL (ref 0–0.1)
Basophils Relative: 1 %
EOS ABS: 0 10*3/uL (ref 0–0.7)
EOS PCT: 1 %
HCT: 42 % (ref 40.0–52.0)
Hemoglobin: 14 g/dL (ref 13.0–18.0)
LYMPHS ABS: 0.4 10*3/uL — AB (ref 1.0–3.6)
Lymphocytes Relative: 23 %
MCH: 29.7 pg (ref 26.0–34.0)
MCHC: 33.4 g/dL (ref 32.0–36.0)
MCV: 88.8 fL (ref 80.0–100.0)
MONOS PCT: 13 %
Monocytes Absolute: 0.2 10*3/uL (ref 0.2–1.0)
Neutro Abs: 1.2 10*3/uL — ABNORMAL LOW (ref 1.4–6.5)
Neutrophils Relative %: 62 %
PLATELETS: 108 10*3/uL — AB (ref 150–440)
RBC: 4.73 MIL/uL (ref 4.40–5.90)
RDW: 14.6 % — ABNORMAL HIGH (ref 11.5–14.5)
WBC: 1.9 10*3/uL — ABNORMAL LOW (ref 3.8–10.6)

## 2015-11-06 LAB — ETHANOL: Alcohol, Ethyl (B): <5 mg/dL (ref <5)

## 2015-11-06 LAB — SALICYLATE LEVEL

## 2015-11-06 LAB — ACETAMINOPHEN LEVEL

## 2015-11-06 MED ORDER — SULFAMETHOXAZOLE-TRIMETHOPRIM 800-160 MG PO TABS: 1.0000 | ORAL_TABLET | Freq: Every day | ORAL | Status: DC

## 2015-11-06 MED ORDER — SULFAMETHOXAZOLE-TRIMETHOPRIM 800-160 MG PO TABS: 2.0000 | ORAL_TABLET | Freq: Two times a day (BID) | ORAL | Status: DC

## 2015-11-06 MED ORDER — TETANUS-DIPHTH-ACELL PERTUSSIS 5-2.5-18.5 LF-MCG/0.5 IM SUSP
0.5000 mL | Freq: Once | INTRAMUSCULAR | Status: AC
  Administered 2015-11-06: 0.5 mL via INTRAMUSCULAR
  Filled 2015-11-06: qty 0.5

## 2015-11-06 MED ORDER — CEPHALEXIN 500 MG PO CAPS: 500.0000 mg | ORAL_CAPSULE | Freq: Four times a day (QID) | ORAL | Status: AC

## 2015-11-06 MED ORDER — QUETIAPINE FUMARATE 100 MG PO TABS: 100.0000 mg | ORAL_TABLET | Freq: Every day | ORAL | Status: DC

## 2015-11-06 MED ORDER — TETANUS-DIPHTHERIA TOXOIDS TD 5-2 LFU IM INJ
0.5000 mL | INJECTION | Freq: Once | INTRAMUSCULAR | Status: DC
  Filled 2015-11-06: qty 0.5

## 2015-11-06 MED ORDER — SULFAMETHOXAZOLE-TRIMETHOPRIM 800-160 MG PO TABS
2.0000 | ORAL_TABLET | Freq: Once | ORAL | Status: AC
  Administered 2015-11-06: 2 via ORAL
  Filled 2015-11-06: qty 2

## 2015-11-06 MED ORDER — LIDOCAINE-EPINEPHRINE (PF) 2 %-1:200000 IJ SOLN: 30.0000 mL | Freq: Once | INTRAMUSCULAR | Status: DC

## 2015-11-06 MED ORDER — CEPHALEXIN 500 MG PO CAPS
500.0000 mg | ORAL_CAPSULE | Freq: Four times a day (QID) | ORAL | Status: DC
  Filled 2015-11-06 (×2): qty 1

## 2015-11-06 MED ORDER — CEPHALEXIN 500 MG PO CAPS
500.0000 mg | ORAL_CAPSULE | Freq: Once | ORAL | Status: AC
  Administered 2015-11-06: 500 mg via ORAL
  Filled 2015-11-06 (×2): qty 1

## 2015-11-06 MED ORDER — LIDOCAINE-EPINEPHRINE (PF) 1 %-1:200000 IJ SOLN
INTRAMUSCULAR | Status: AC
  Administered 2015-11-06: 30 mL
  Filled 2015-11-06: qty 30

## 2015-11-06 NOTE — Discharge Instructions (Signed)

## 2015-11-06 NOTE — Consult Note (Signed)
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 Currently in the emergency room for treatment of a abscess Referring Physician:  Dola Patient Identification: JAMARIE MUSSA MRN:  [id number] Principal Diagnosis: Substance induced mood disorder [facility]) Diagnosis:   Patient Active Problem List   Diagnosis Date Noted  . Abscess of antecubital fossa [L02.419] 11/06/2015  . Substance induced mood disorder ([place]) [F19.94] 11/06/2015  . 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 Marijuana abuse [F12.10] 07/25/2015  . Amphetamine abuse [F15.10] 07/25/2015  . Cocaine abuse [F14.10] 07/25/2015  . HIV positive ([place]) [Z21] 07/25/2015    Total Time spent with patient: 45 minutes  Subjective:   KOURTNEY MONTESINOS is a 29 y.o. male patient admitted with "I've just been letting myself get dragged down".  HPI:  Patient interviewed. Chart reviewed. Case discussed with emergency room physician and TTS. 30 year old man who has a history of abuse of amphetamines and other substances came into the hospital with an abscess in his left antecubital fossa related to intravenous drug use. Patient says he is using intravenous amphetamines and cocaine on a fairly regular basis. Denies that he is using narcotics. Also smokes marijuana regularly. He's had episodes of amphetamine psychosis in the past but today he is not presenting with psychotic symptoms. He says his mood stays down and he is negative about himself but denies any suicidal thoughts. Not sleeping well at night. Not currently taking any medication. Doesn't have much going on in his life.  Social history: Patient is living with his mother. 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 Feels like he is not making much of his life right now.  Medical history: He is HIV positive. Has had appropriate treatment in the past. Has an antecubital  fossa abscess which needs attention and further treatment.  Substance abuse history: Abuse of amphetamines and cocaine. He also smokes marijuana. After he was here in our emergency room last night he went to wake med briefly but then has only partially followed up with substance abuse treatment outpatient.  Past Psychiatric History: History of treatment with Seroquel for sleep and mood disorder at night. History of treatment with Ritalin and Adderall as a child. No history of suicide attempts  Risk to Self: Suicidal Ideation: No Suicidal Intent: No Is patient at risk for suicide?: No Suicidal Plan?: No Access to Means: No What has been your use of drugs/alcohol within the last 12 months?: Alcohol, Cannabis, Ecstasy, Cocaine & Methamphetamine How many times?: 0 Other Self Harm Risks: Active Addiction Triggers for Past Attempts: None known Intentional Self Injurious Behavior: None Risk to Others: Homicidal Ideation: No Thoughts of Harm to Others: No Current Homicidal Intent: No Current Homicidal Plan: No Access to Homicidal Means: No Identified Victim: Reports of none History of harm to others?: No Assessment of Violence: None Noted Violent Behavior Description: Reports of none Does patient have access to weapons?: No Criminal Charges Pending?: No Does patient have a court date: No Prior Inpatient Therapy: Prior Inpatient Therapy: No Prior Therapy Dates: n/a Prior Therapy Facilty/Provider(s): n/a Reason for Treatment: n/a Prior Outpatient Therapy: Prior Outpatient Therapy: Yes Prior Therapy Dates: Currently Prior Therapy Facilty/Provider(s): Patient can't remember the name Reason for Treatment: Depression Does patient have an ACCT team?: No Does patient have Intensive In-House Services?  : No Does patient have [facility] services? : No Does patient have P4CC services?: No  Past Medical History:  Past Medical History  Diagnosis Date  . HIV (human immunodeficiency virus  infection) [facility])     Past Surgical History  Procedure Laterality Date  . Lung biopsy     Family History: No family history on file. Family Psychiatric  History: Positive for some substance abuse Social History:  History  Alcohol Use  . 3.6 oz/week  . 6 Cans of beer per week     History  Drug Use  . Yes  . Special: Marijuana, IV, Methamphetamines    Social History   Social History  . Marital Status: Single    Spouse Name: N/A  . Number of Children: N/A  . Years of Education: N/A   Social History Main Topics  . Smoking status: Current Every Day Smoker  . Smokeless tobacco: None  . Alcohol Use: 3.6 oz/week    6 Cans of beer per week  . Drug Use: Yes    Special: Marijuana, IV, Methamphetamines  . Sexual Activity: Not Asked   Other Topics Concern  . None   Social History Narrative   Additional Social History:    Allergies:  No Known Allergies  Labs:  Results for orders placed or performed during the hospital encounter of 11/06/15 (from the past 48 hour(s))  CBC with Differential     Status: Abnormal   Collection Time: 11/06/15  3:59 PM  Result Value Ref Range   WBC 1.9 (L) 3.8 - 10.6 K/uL   RBC 4.73 4.40 - 5.90 MIL/uL   Hemoglobin 14.0 13.0 - 18.0 g/dL   HCT 42.0 40.0 - 52.0 %   MCV 88.8 80.0 - 100.0 fL   MCH 29.7 26.0 - 34.0 pg   MCHC 33.4 32.0 - 36.0 g/dL   RDW 14.6 (H) 11.5 - 14.5 %   Platelets 108 (L) 150 - 440 K/uL   Neutrophils Relative % 62 %   Neutro Abs 1.2 (L) 1.4 - 6.5 K/uL   Lymphocytes Relative 23 %   Lymphs Abs 0.4 (L) 1.0 - 3.6 K/uL   Monocytes Relative 13 %   Monocytes Absolute 0.2 0.2 - 1.0 K/uL   Eosinophils Relative 1 %   Eosinophils Absolute 0.0 0 - 0.7 K/uL   Basophils Relative 1 %   Basophils Absolute 0.0 0 - 0.1 K/uL  Comprehensive metabolic panel     Status: Abnormal   Collection Time: 11/06/15  3:59 PM  Result Value Ref Range   Sodium 134 (L) 135 - 145 mmol/L   Potassium 3.1 (L) 3.5 - 5.1 mmol/L   Chloride 102 101 - 111  mmol/L   CO2 26 22 - 32 mmol/L   Glucose, Bld 80 65 - 99 mg/dL   BUN 7 6 - 20 mg/dL   Creatinine, Ser 1.02 0.61 - 1.24 mg/dL   Calcium 9.0 8.9 - 10.3 mg/dL   Total Protein 8.3 (H) 6.5 - 8.1 g/dL   Albumin 4.0 3.5 - 5.0 g/dL   AST 6 (L) 15 - 41 U/L   ALT 21 17 - 63 U/L   Alkaline Phosphatase 56 38 - 126 U/L   Total Bilirubin 0.6 0.3 - 1.2 mg/dL   GFR calc non Af Amer >60 >60 mL/min   GFR calc Af Amer >60 >60 mL/min    Comment: (NOTE) The eGFR has been calculated using the CKD EPI equation. This calculation has not been validated in all clinical situations. eGFR's persistently <60 mL/min signify possible Chronic Kidney Disease.    Anion gap 6  5 - 15  Ethanol     Status: None   Collection Time: 11/06/15  3:59 PM  Result Value Ref Range   Alcohol, Ethyl (B) <5 <5 mg/dL    Comment:        LOWEST DETECTABLE LIMIT FOR SERUM ALCOHOL IS 5 mg/dL FOR MEDICAL PURPOSES ONLY   Acetaminophen level     Status: Abnormal   Collection Time: 11/06/15  3:59 PM  Result Value Ref Range   Acetaminophen (Tylenol), Serum <10 (L) 10 - 30 ug/mL    Comment:        THERAPEUTIC CONCENTRATIONS VARY SIGNIFICANTLY. A RANGE OF 10-30 ug/mL MAY BE AN EFFECTIVE CONCENTRATION FOR MANY PATIENTS. HOWEVER, SOME ARE BEST TREATED AT CONCENTRATIONS OUTSIDE THIS RANGE. ACETAMINOPHEN CONCENTRATIONS >150 ug/mL AT 4 HOURS AFTER INGESTION AND >50 ug/mL AT 12 HOURS AFTER INGESTION ARE OFTEN ASSOCIATED WITH TOXIC REACTIONS.   Salicylate level     Status: None   Collection Time: 11/06/15  3:59 PM  Result Value Ref Range   Salicylate Lvl <4.0 2.8 - 30.0 mg/dL  Urine Drug Screen, Qualitative ([facility] only)     Status: Abnormal   Collection Time: 11/06/15  4:00 PM  Result Value Ref Range   Tricyclic, Ur Screen NONE DETECTED NONE DETECTED   Amphetamines, Ur Screen NONE DETECTED NONE DETECTED   MDMA (Ecstasy)Ur Screen NONE DETECTED NONE DETECTED   Cocaine Metabolite,Ur [place] POSITIVE (A) NONE DETECTED   Opiate, Ur  Screen NONE DETECTED NONE DETECTED   Phencyclidine (PCP) Ur S NONE DETECTED NONE DETECTED   Cannabinoid 50 Ng, Ur [place] POSITIVE (A) NONE DETECTED   Barbiturates, Ur Screen NONE DETECTED NONE DETECTED   Benzodiazepine, Ur Scrn NONE DETECTED NONE DETECTED   Methadone Scn, Ur NONE DETECTED NONE DETECTED    Comment: (NOTE) 086  Tricyclics, urine               Cutoff 1000 ng/mL 200  Amphetamines, urine             Cutoff 1000 ng/mL 300  MDMA (Ecstasy), urine           Cutoff 500 ng/mL 400  Cocaine Metabolite, urine       Cutoff 300 ng/mL 500  Opiate, urine                   Cutoff 300 ng/mL 600  Phencyclidine (PCP), urine      Cutoff 25 ng/mL 700  Cannabinoid, urine              Cutoff 50 ng/mL 800  Barbiturates, urine             Cutoff 200 ng/mL 900  Benzodiazepine, urine           Cutoff 200 ng/mL 1000 Methadone, urine                Cutoff 300 ng/mL 1100 1200 The urine drug screen provides only a preliminary, unconfirmed 1300 analytical test result and should not be used for non-medical 1400 purposes. Clinical consideration and professional judgment should 1500 be applied to any positive drug screen result due to possible 1600 interfering substances. A more specific alternate chemical method 1700 must be used in order to obtain a confirmed analytical result.  1800 Gas chromato graphy / mass spectrometry (GC/MS) is the preferred 1900 confirmatory method.   Urinalysis complete, with microscopic [facility] only)     Status: Abnormal   Collection Time: 11/06/15  4:00 PM  Result Value Ref Range  Color, Urine STRAW (A) YELLOW   APPearance CLEAR (A) CLEAR   Glucose, UA NEGATIVE NEGATIVE mg/dL   Bilirubin Urine NEGATIVE NEGATIVE   Ketones, ur NEGATIVE NEGATIVE mg/dL   Specific Gravity, Urine 1.004 (L) 1.005 - 1.030   Hgb urine dipstick NEGATIVE NEGATIVE   pH 6.0 5.0 - 8.0   Protein, ur NEGATIVE NEGATIVE mg/dL   Nitrite NEGATIVE NEGATIVE   Leukocytes, UA NEGATIVE NEGATIVE   RBC / HPF NONE  SEEN 0 - 5 RBC/hpf   WBC, UA 0-5 0 - 5 WBC/hpf   Bacteria, UA RARE (A) NONE SEEN   Squamous Epithelial / LPF 0-5 (A) NONE SEEN    Current Facility-Administered Medications  Medication Dose Route Frequency Provider Last Rate Last Dose  . cephALEXin (KEFLEX) capsule 500 mg  500 mg Oral 4 times per day Orbie Pyo, MD      . lidocaine-EPINEPHrine (XYLOCAINE W/EPI) 2 %-1:200000 (PF) injection 30 mL  30 mL Intradermal Once Orbie Pyo, MD   Stopped at 11/06/15 1516  . [START ON 11/17/2015] sulfamethoxazole-trimethoprim (BACTRIM DS,SEPTRA DS) 800-160 MG per tablet 1 tablet  1 tablet Oral Daily Orbie Pyo, MD      . Derrill Memo ON 11/07/2015] sulfamethoxazole-trimethoprim (BACTRIM DS,SEPTRA DS) 800-160 MG per tablet 2 tablet  2 tablet Oral Q12H Orbie Pyo, MD       Current Outpatient Prescriptions  Medication Sig Dispense Refill  . ondansetron (ZOFRAN ODT) 4 MG disintegrating tablet Take 1 tablet (4 mg total) by mouth every 6 (six) hours as needed for nausea or vomiting. 15 tablet 0  . penicillin v potassium (VEETID) 500 MG tablet Take 1 tablet (500 mg total) by mouth 4 (four) times daily. 40 tablet 0  . QUEtiapine (SEROQUEL) 100 MG tablet Take 1 tablet (100 mg total) by mouth at bedtime. 30 tablet 0    Musculoskeletal: Strength & Muscle Tone: within normal limits Gait & Station: normal Patient leans: N/A  Psychiatric Specialty Exam: Review of Systems  Constitutional: Negative.   HENT: Negative.   Eyes: Negative.   Respiratory: Negative.   Cardiovascular: Negative.   Gastrointestinal: Negative.   Musculoskeletal: Positive for joint pain.  Skin: Negative.   Neurological: Negative.   Psychiatric/Behavioral: Positive for depression and substance abuse. Negative for suicidal ideas, hallucinations and memory loss. The patient is nervous/anxious and has insomnia.     Blood pressure 140/83, pulse 115, temperature 98.3 F (36.8 C), temperature source  Oral, resp. rate 16, height _0  (1.803 m), weight 79.379 kg (175 lb), SpO2 96 %.Body mass index is 24.42 kg/(m^2).  General Appearance: Fairly Groomed  Engineer, water::  Good  Speech:  Clear and Coherent  Volume:  Normal  Mood:  Dysphoric  Affect:  Constricted  Thought Process:  Goal Directed  Orientation:  Full (Time, Place, and Person)  Thought Content:  Negative  Suicidal Thoughts:  No  Homicidal Thoughts:  No  Memory:  Immediate;   Good Recent;   Fair Remote;   Fair  Judgement:  Fair  Insight:  Fair  Psychomotor Activity:  Normal  Concentration:  Fair  Recall:  [employer] of Knowledge:Fair  Language: Fair  Akathisia:  No  Handed:  Right  AIMS (if indicated):     Assets:  Communication Skills Desire for Improvement Housing Resilience  ADL's:  Intact  Cognition: WNL  Sleep:      Treatment Plan Summary: Medication management and Plan Patient was substance induced mood disorder also ongoing abuse of multiple substances.  He is aware that he is starting to do really dangerous behavior to be shooting up. Patient reminded of the importance of taking care of his health. I have agreed to give him a prescription for Seroquel 100 mg at night to be taken to help with his sleep. Apparently this medicine was used to wake med and he thought that it was helpful. He is aware that this is a potentially abusable medicine but says he is only going to take it himself at night to help with sleep and does not normally abuse sedatives anyway. Patient is strongly encouraged to continue with his outpatient follow-up at [facility]. Case reviewed with emergency room physician and TTS. Patient can be released from the emergency room.  Disposition: Patient does not meet criteria for psychiatric inpatient admission. Supportive therapy provided about ongoing stressors.  Alethia Berthold, MD 11/06/2015 7:30 PM

## 2015-11-06 NOTE — ED Notes (Addendum)
Pt c/o swollen red area to the left [facility] for the past 5 days, pt states he is an IV drug user..pt also states he would like to talk with someone about options for detox, states he does not want to but want to discuss his options.

## 2015-11-06 NOTE — ED Provider Notes (Addendum)
[facility] Emergency Department Provider Note  ____________________________________________  Time seen: Approximately 215 PM  I have reviewed the triage vital signs and the nursing notes.   HISTORY  Chief Complaint Abscess    HPI NYZIER BOIVIN is a 30 y.o. male with a history of HIV as well as polysubstance abuse who is presenting to the emergency department today with a left antecubital abscess. He says it has been there over the past 4 days and has been getting larger. He says he also has an associated ropelike structure to his left medial arm. He says the pain is decreased with the size of the lesion has increased. He does admit to using methamphetamines the IV to his left antecubital just prior to the abscess formation. The patient is also saying that he feels depressed with a lack of energy and is concerned that "everybody is out to get him." He called police prior to arrival to discuss these issues with the police. The police evaluated in the emergency department here but decided not to take out papers.  He denies any suicidal or homicidal ideation.He says that he has not been on his HIV medication in months. He does not know his last CD4 count. He does not think he has had the diagnosis of AIDS in the past.   Past Medical History  Diagnosis Date  . HIV (human immunodeficiency virus infection) [facility])     Patient Active Problem List   Diagnosis Date Noted  . 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 Marijuana abuse 07/25/2015  . 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 Cocaine abuse 07/25/2015  . HIV positive ([facility]) 07/25/2015    Past Surgical History  Procedure Laterality Date  . Lung biopsy      Current Outpatient Rx  Name  Route  Sig  Dispense  Refill  . ondansetron (ZOFRAN ODT) 4 MG disintegrating tablet   Oral   Take 1 tablet (4 mg total) by mouth every 6 (six) hours as needed for nausea or vomiting.   15 tablet   0   . penicillin v potassium (VEETID)  500 MG tablet   Oral   Take 1 tablet (500 mg total) by mouth 4 (four) times daily.   40 tablet   0     Allergies Review of patient's allergies indicates no known allergies.  No family history on file.  Social History Social History  Substance Use Topics  . Smoking status: Current Every Day Smoker  . Smokeless tobacco: None  . Alcohol Use: 3.6 oz/week    6 Cans of beer per week    Review of Systems Constitutional: No fever/chills Eyes: No visual changes. ENT: No sore throat. Cardiovascular: Denies chest pain. Respiratory: Denies shortness of breath. Gastrointestinal: No abdominal pain.  No nausea, no vomiting.  No diarrhea.  No constipation. Genitourinary: Negative for dysuria. Musculoskeletal: Negative for back pain. Skin: As above Neurological: Negative for headaches, focal weakness or numbness.  10-point ROS otherwise negative.  ____________________________________________   PHYSICAL EXAM:  VITAL SIGNS: ED Triage Vitals  Enc Vitals Group     BP 11/06/15 1145 140/83 mmHg     Pulse Rate 11/06/15 1145 115     Resp 11/06/15 1145 16     Temp 11/06/15 1145 98.3 F (36.8 C)     Temp Source 11/06/15 1145 Oral     SpO2 11/06/15 1145 96 %     Weight 11/06/15 1145 175 lb (79.379 kg)     Height 11/06/15 1145 5'  11" (1.803 m)     Head Cir --      Peak Flow --      Pain Score 11/06/15 1146 8     Pain Loc --      Pain Edu? --      Excl. in GC? --     Constitutional: Alert and oriented. Well appearing and in no acute distress. Eyes: Conjunctivae are normal. PERRL. EOMI. Head: Atraumatic. Nose: No congestion/rhinnorhea. Mouth/Throat: Mucous membranes are moist.  Neck: No stridor.   Cardiovascular: Normal rate, regular rhythm. Grossly normal heart sounds.   Respiratory: Normal respiratory effort.  No retractions. Lungs CTAB. Gastrointestinal: Soft and nontender. No distention. No abdominal bruits. No CVA tenderness. Musculoskeletal: No lower extremity tenderness  nor edema.  No joint effusions. Neurologic:  Normal speech and language. No gross focal neurologic deficits are appreciated. No gait instability. Skin:  Left antecubital with 2 x 3 cm raised lesion that is tender to palpation and erythematous. Mild fluctuance. It is located laterally in the antecubital. Psychiatric: Mood and affect are normal. Speech and behavior are normal.  ____________________________________________   LABS (all labs ordered are listed, but only abnormal results are displayed)  Labs Reviewed  CBC WITH DIFFERENTIAL/PLATELET  COMPREHENSIVE METABOLIC PANEL  ETHANOL  ACETAMINOPHEN LEVEL  SALICYLATE LEVEL  URINE DRUG SCREEN, QUALITATIVE ([facility] ONLY)  URINALYSIS COMPLETEWITH MICROSCOPIC ([facility] ONLY)  HELPER T-LYMPH-CD4 ([facility] ONLY)   ____________________________________________  EKG   ____________________________________________  RADIOLOGY  Bedside point of care ultrasound with pus filled lesion to the left antecubital fossa. No vasculature running through the lesion. ____________________________________________   PROCEDURES  INCISION AND DRAINAGE Performed by: Arelia Longest Consent: Verbal consent obtained. Risks and benefits: risks, benefits and alternatives were discussed Type: abscess  Body area: Left antecubital abscess.  Anesthesia: local infiltration  Incision was made with a scalpel.  Local anesthetic: lidocaine 1 % with epinephrine  Anesthetic total: 4 ml  Complexity: complex Blunt dissection to break up loculations  Drainage: purulent  Drainage amount: 3 mL   Packing material: no packing was placed.  About a half centimeters incision was made. I chose to make a small incision secondary to the area being close to vasculature   Patient tolerance: Patient tolerated the procedure well with no immediate complications.    ____________________________________________   INITIAL IMPRESSION / ASSESSMENT AND PLAN / ED  COURSE  Pertinent labs & imaging results that were available during my care of the patient were reviewed by me and considered in my medical decision making (see chart for details).  ----------------------------------------- 3:47 PM on 11/06/2015 -----------------------------------------  Patient tolerated the procedure well. No complications. Will be transferred to the quad area for further psychiatric evaluation. I also discussed case Dr. Sampson Goon who recommended the patient call the [place] [facility] which is a local HIV organization to speak with Ms. Laurette Schimke. The phone numbers [phone]. ____________________________________________   FINAL CLINICAL IMPRESSION(S) / ED DIAGNOSES  IV drug abuse. Left any cubital fossa abscess. Depression.    Myrna Blazer, MD 11/06/15 [phone]  Patient seen and evaluated by Dr. Toni Amend of psychiatry who deemed him appropriate for outpatient follow-up with [facility]. Dr. Toni Amend wrote a prescription for Seroquel for the patient. Patient continues to be cooperative and without any suicidal or homicidal ideation. I discussed with the plan for follow-up with the [place] [facility] and he understands the plan for this as well. We also discussed continuing to take daily Bactrim after the 10 day course of Bactrim  and Keflex because we do not know CD4 count. I did send a CD4 count but will take 1-2 days to result.  Myrna Blazeravid Matthew Severus Brodzinski, MD 11/06/15 1950   IMPRESSION: 1. No evidence of DVT within the left upper extremity. 2. Indeterminate approximately 3.3 cm mixed echogenic structure correlates with the patient's palpable area of concern about the left elbow. While indeterminate, given history of intravenous drug abuse, and as blood flow is demonstrated within this structure, this is favored to represent either phlegmonous tissue versus a foreign body reaction. Currently there is no definitive definable/drainable fluid  collection.   Electronically Signed By: Simonne ComeJohn Watts M.D.  Given that the patient has negative infection I favor phlegmonous tissue. He does not think that a foreign body was left in his arm such as a needle. He says he will keep track of the swelling as he takes his antibiotics and will follow up if it does not resolve.  Myrna Blazeravid Matthew Loreena Valeri, MD 11/06/15 [phone]

## 2015-11-06 NOTE — BH Assessment (Signed)
Assessment Note  Dustin Bennett is an 30 y.o. male who presents to the ER due to having concerns about his physical health. While in the ER, he mentioned to staff he wanted help for his substance use and his mood. He admits to abusing Alcohol, Cannabis, Cocaine, Ecstasy & Methamphetamine.  Patient states he was seen in [facility] in the recent past and he was prescribed "Seroquel" for sleep. He didn't taking the prescription due to the fear of abusing it.  Patient denies SI/HI and AV/H  Diagnosis:  Alcohol Use Disorder, Severe  Cannabis Use Disorder, Severe  Stimulant Use Disorder, Severe Cocaine Use Disorder, Severe Unspecified Depressive Disorder   Past Medical History:  Past Medical History  Diagnosis Date  . HIV (human immunodeficiency virus infection) [facility](HCC)     Past Surgical History  Procedure Laterality Date  . Lung biopsy      Family History: No family history on file.  Social History:  reports that he has been smoking.  He does not have any smokeless tobacco history on file. He reports that he drinks about 3.6 oz of alcohol per week. He reports that he uses illicit drugs (Marijuana, IV, and Methamphetamines).  Additional Social History:  Alcohol / Drug Use Pain Medications: See PTA Prescriptions: See PTA Over the Counter: See PTA History of alcohol / drug use?: Yes Longest period of sobriety (when/how long): "Like 4 years" Negative Consequences of Use: Work / School Substance #1 Name of Substance 1: Alcohol 1 - Age of First Use: 17 1 - Amount (size/oz): "1 to 2 beers or a couple shots of Henny (Hennessy)" 1 - Frequency: 2 to 3 days a week 1 - Duration: "Long time" 1 - Last Use / Amount: 11/05/2015 Substance #2 Name of Substance 2: Cannabis 2 - Age of First Use: 17 2 - Amount (size/oz): Unable to quantify 2 - Frequency: Daily 2 - Duration: Approximately a year 2 - Last Use / Amount: 11/05/2015 Substance #3 Name of Substance 3: Molly-Ecstasy 3 - Age of First  Use: 23 3 - Amount (size/oz): "One pill" 3 - Frequency: One day out of the week 3 - Duration: Unable to quantify 3 - Last Use / Amount: 11/05/2015 Substance #4 Name of Substance 4: Cocaine (Powder) 4 - Age of First Use: 15 4 - Amount (size/oz): 1 gram 4 - Frequency: Approximately 2x a month 4 - Duration: "Not that often" 4 - Last Use / Amount: 11/04/2015 Substance #5 Name of Substance 5: Methamphetamine 5 - Age of First Use: 29 5 - Amount (size/oz): "Like a gram or half a gram" 5 - Frequency: Two days a week 5 - Duration: "Since last year" 5 - Last Use / Amount: 11/05/2015  CIWA: CIWA-Ar BP: 140/83 mmHg Pulse Rate: (!) 115 COWS:    Allergies: No Known Allergies  Home Medications:  (Not in a hospital admission)  OB/GYN Status:  No LMP for male patient.  General Assessment Data [facility] TTS Assessment: In system Is this a Tele or Face-to-Face Assessment?: Face-to-Face Is this an Initial Assessment or a Re-assessment for this encounter?: Initial Assessment Marital status: Single Maiden name: n/a Is patient pregnant?: No Pregnancy Status: No Living Arrangements: Parent Can pt return to current living arrangement?: Yes Admission Status: Voluntary Is patient capable of signing voluntary admission?: Yes Referral Source: Self/Family/Friend Insurance type: None Reported  Medical Screening Exam [facility] Walk-[facility] ONLY) Medical Exam completed: Yes  Crisis Care Plan Living Arrangements: Parent Legal Guardian: Other: (None)  Name of Psychiatrist: "Some lady at [facility]" Name of Therapist: "Some place with [facility]"  Education Status Is patient currently in school?: Yes Current Grade: n/a Highest grade of school patient has completed: Associate Degree Name of school: Received it in [place], [place] Contact person: n/a  Risk to self with the past 6 months Suicidal Ideation: No Suicidal Intent: No Has patient had any suicidal intent within the past 6  months prior to admission? : No Is patient at risk for suicide?: No Suicidal Plan?: No Has patient had any suicidal plan within the past 6 months prior to admission? : No Access to Means: No What has been your use of drugs/alcohol within the last 12 months?: Alcohol, Cannabis, Ecstasy, Cocaine & Methamphetamine Previous Attempts/Gestures: No How many times?: 0 Other Self Harm Risks: Active Addiction Triggers for Past Attempts: None known Intentional Self Injurious Behavior: None Family Suicide History: No Recent stressful life event(s): Other (Comment), Conflict (Comment), Financial Problems, Trauma (Comment) (Active Addiction) Persecutory voices/beliefs?: No Depression: Yes Depression Symptoms: Feeling angry/irritable, Feeling worthless/self pity, Loss of interest in usual pleasures, Tearfulness, Isolating, Guilt Substance abuse history and/or treatment for substance abuse?: Yes Suicide prevention information given to non-admitted patients: Not applicable  Risk to Others within the past 6 months Homicidal Ideation: No Does patient have any lifetime risk of violence toward others beyond the six months prior to admission? : No Thoughts of Harm to Others: No Current Homicidal Intent: No Current Homicidal Plan: No Access to Homicidal Means: No Identified Victim: Reports of none History of harm to others?: No Assessment of Violence: None Noted Violent Behavior Description: Reports of none Does patient have access to weapons?: No Criminal Charges Pending?: No Does patient have a court date: No Is patient on probation?: Yes  Psychosis Hallucinations: None noted Delusions: None noted  Mental Status Report Appearance/Hygiene: Unremarkable (Own Clothing) Eye Contact: Good Motor Activity: Freedom of movement, Unremarkable Speech: Logical/coherent, Unremarkable Level of Consciousness: Alert Mood: Pleasant, Depressed, Sad Affect: Appropriate to circumstance Anxiety Level:  None Thought Processes: Coherent, Relevant Judgement: Unimpaired Orientation: Person, Place, Time, Situation, Appropriate for developmental age Obsessive Compulsive Thoughts/Behaviors: Minimal  Cognitive Functioning Concentration: Normal Memory: Recent Intact, Remote Intact IQ: Average Insight: Fair Impulse Control: Poor Appetite: Good Weight Loss: 0 Weight Gain: 0 Sleep: Decreased Total Hours of Sleep: 4 (Trouble falling asleep) Vegetative Symptoms: None  ADLScreening [facility] Assessment Services) Patient's cognitive ability adequate to safely complete daily activities?: Yes Patient able to express need for assistance with ADLs?: Yes Independently performs ADLs?: Yes (appropriate for developmental age)  Prior Inpatient Therapy Prior Inpatient Therapy: No Prior Therapy Dates: n/a Prior Therapy Facilty/Provider(s): n/a Reason for Treatment: n/a  Prior Outpatient Therapy Prior Outpatient Therapy: Yes Prior Therapy Dates: Currently Prior Therapy Facilty/Provider(s): Patient can't remember the name Reason for Treatment: Depression Does patient have an ACCT team?: No Does patient have Intensive In-House Services?  : No Does patient have [facility] services? : No Does patient have P4CC services?: No  ADL Screening (condition at time of admission) Patient's cognitive ability adequate to safely complete daily activities?: Yes Is the patient deaf or have difficulty hearing?: No Does the patient have difficulty seeing, even when wearing glasses/contacts?: No Does the patient have difficulty concentrating, remembering, or making decisions?: No Patient able to express need for assistance with ADLs?: Yes Does the patient have difficulty dressing or bathing?: No Independently performs ADLs?: Yes (appropriate for developmental age) Does the patient have difficulty walking or climbing stairs?: No Weakness of Legs:  None Weakness of Arms/Hands: None  Home Assistive Devices/Equipment Home  Assistive Devices/Equipment: None  Therapy Consults (therapy consults require a physician order) PT Evaluation Needed: No OT Evalulation Needed: No SLP Evaluation Needed: No Abuse/Neglect Assessment (Assessment to be complete while patient is alone) Physical Abuse: Denies Verbal Abuse: Yes, past (Comment) ("That's how we talk. I realize now it's verbal abuse") Sexual Abuse: Denies Exploitation of patient/patient's resources: Denies Self-Neglect: Denies Values / Beliefs Cultural Requests During Hospitalization: None Spiritual Requests During Hospitalization: None Consults Spiritual Care Consult Needed: No Social Work Consult Needed: No Merchant navy officer (For Healthcare) Does patient have an advance directive?: No Would patient like information on creating an advanced directive?: No - patient declined information    Additional Information 1:1 In Past 12 Months?: No CIRT Risk: No Elopement Risk: No Does patient have medical clearance?: Yes  Child/Adolescent Assessment Running Away Risk: Denies (Patient is an adult)  Disposition:  Disposition Initial Assessment Completed for this Encounter: Yes Disposition of Patient: Other dispositions  On Site Evaluation by:   Reviewed with Physician:    Lilyan Gilford MS, LCAS, LPC, NCC, CCSI Therapeutic Triage Specialist 11/06/2015 6:27 PM

## 2015-11-07 LAB — HELPER T-LYMPH-CD4 (ARMC ONLY)
% CD 4 Pos. Lymph.: 14.3 % — ABNORMAL LOW (ref 30.8–58.5)
Absolute CD 4 Helper: 72 /uL — ABNORMAL LOW (ref 359–1519)
BASOS: 0 %
Basophils Absolute: 0 10*3/uL (ref 0.0–0.2)
EOS (ABSOLUTE): 0 10*3/uL (ref 0.0–0.4)
EOS: 0 %
HEMATOCRIT: 42.3 % (ref 37.5–51.0)
HEMOGLOBIN: 14.3 g/dL (ref 12.6–17.7)
IMMATURE GRANS (ABS): 0 10*3/uL (ref 0.0–0.1)
Immature Granulocytes: 1 %
LYMPHS: 24 %
Lymphocytes Absolute: 0.5 10*3/uL — ABNORMAL LOW (ref 0.7–3.1)
MCH: 29.7 pg (ref 26.6–33.0)
MCHC: 33.8 g/dL (ref 31.5–35.7)
MCV: 88 fL (ref 79–97)
Monocytes Absolute: 0.3 10*3/uL (ref 0.1–0.9)
Monocytes: 18 %
NEUTROS ABS: 1.1 10*3/uL — AB (ref 1.4–7.0)
Neutrophils: 57 %
Platelets: 121 10*3/uL — ABNORMAL LOW (ref 150–379)
RBC: 4.82 x10E6/uL (ref 4.14–5.80)
RDW: 15.2 % (ref 12.3–15.4)
WBC: 1.9 10*3/uL — AB (ref 3.4–10.8)

## 2015-11-20 ENCOUNTER — Emergency Department
Admission: EM | Admit: 2015-11-20 | Discharge: 2015-11-20 | Disposition: A | Discharge status: Home / Self Care | Payer: Self-pay | Attending: Emergency Medicine | Admitting: Emergency Medicine

## 2015-11-20 ENCOUNTER — Encounter: Payer: Self-pay | Admitting: Emergency Medicine

## 2015-11-20 DIAGNOSIS — F1410 Cocaine abuse, uncomplicated: Secondary | ICD-10-CM | POA: Insufficient documentation

## 2015-11-20 DIAGNOSIS — F1910 Other psychoactive substance abuse, uncomplicated: Secondary | ICD-10-CM

## 2015-11-20 DIAGNOSIS — F1020 Alcohol dependence, uncomplicated: Secondary | ICD-10-CM | POA: Insufficient documentation

## 2015-11-20 DIAGNOSIS — F17200 Nicotine dependence, unspecified, uncomplicated: Secondary | ICD-10-CM | POA: Insufficient documentation

## 2015-11-20 NOTE — ED Provider Notes (Signed)
Time Seen: Approximately 2130 I have reviewed the triage notes  Chief Complaint: Addiction Problem   History of Present Illness: Dustin Bennett is a 30 y.o. male who presents with request for detox and treatment for polysubstance abuse. Patient has a history of alcohol, but and phentermine, and cocaine history. He states he has not used any of these substances over the last 24 hours. He denies any suicidal thoughts, homicidal thoughts, or current hallucinations. He does have a history of depression and paranoid activity when he is using substances. He has history of HIV and denies any fever. He states he has a history of depression is not currently taking the prescribed Seroquel but does have prescriptions at home as needed.   Past Medical History  Diagnosis Date  . HIV (human immunodeficiency virus infection) [facility])     Patient Active Problem List   Diagnosis Date Noted  . Abscess of antecubital fossa 11/06/2015  . Substance induced mood disorder (HCC) 11/06/2015  . 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 Marijuana abuse 07/25/2015  . 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 Cocaine abuse 07/25/2015  . HIV positive (HCC) 07/25/2015    Past Surgical History  Procedure Laterality Date  . Lung biopsy      Past Surgical History  Procedure Laterality Date  . Lung biopsy      Current Outpatient Rx  Name  Route  Sig  Dispense  Refill  . ondansetron (ZOFRAN ODT) 4 MG disintegrating tablet   Oral   Take 1 tablet (4 mg total) by mouth every 6 (six) hours as needed for nausea or vomiting.   15 tablet   0   . penicillin v potassium (VEETID) 500 MG tablet   Oral   Take 1 tablet (500 mg total) by mouth 4 (four) times daily.   40 tablet   0   . QUEtiapine (SEROQUEL) 100 MG tablet   Oral   Take 1 tablet (100 mg total) by mouth at bedtime.   30 tablet   0   . sulfamethoxazole-trimethoprim (BACTRIM DS,SEPTRA DS) 800-160 MG tablet   Oral   Take 2 tablets by mouth 2 (two) times daily.  40 tablet   0   . sulfamethoxazole-trimethoprim (BACTRIM DS,SEPTRA DS) 800-160 MG tablet   Oral   Take 1 tablet by mouth daily. Begin taking this Bactrim after the twice a day medication is complete.   30 tablet   0     Allergies:  Review of patient's allergies indicates no known allergies.  Family History: No family history on file.  Social History: Social History  Substance Use Topics  . Smoking status: Current Every Day Smoker  . Smokeless tobacco: None  . Alcohol Use: 3.6 oz/week    6 Cans of beer per week     Review of Systems:   10 point review of systems was performed and was otherwise negative:  Constitutional: No fever Eyes: No visual disturbances ENT: No sore throat, ear pain Cardiac: No chest pain Respiratory: No shortness of breath, wheezing, or stridor Abdomen: No abdominal pain, no vomiting, No diarrhea Endocrine: No weight loss, No night sweats Extremities: No peripheral edema, cyanosis Skin: No rashes, easy bruising Neurologic: No focal weakness, trouble with speech or swollowing Urologic: No dysuria, Hematuria, or urinary frequency   Physical Exam:  ED Triage Vitals  Enc Vitals Group     BP 11/20/15 1934 138/86 mmHg     Pulse Rate 11/20/15 1934 86     Resp 11/20/15 1934  18     Temp 11/20/15 1934 98.3 F (36.8 C)     Temp Source 11/20/15 1934 Oral     SpO2 11/20/15 1934 96 %     Weight 11/20/15 1934 175 lb (79.379 kg)     Height 11/20/15 1934 5\' 11"  (1.803 m)     Head Cir --      Peak Flow --      Pain Score 11/20/15 1935 0     Pain Loc --      Pain Edu? --      Excl. in GC? --     General: Awake , Alert , and Oriented times 3; GCS 15 Head: Normal cephalic , atraumatic Eyes: Pupils equal , round, reactive to light Nose/Throat: No nasal drainage, patent upper airway without erythema or exudate.  Neck: Supple, Full range of motion, No anterior adenopathy or palpable thyroid masses Lungs: Clear to ascultation without wheezes ,  rhonchi, or rales Heart: Regular rate, regular rhythm without murmurs , gallops , or rubs Abdomen: Soft, non tender without rebound, guarding , or rigidity; bowel sounds positive and symmetric in all 4 quadrants. No organomegaly .        Extremities: 2 plus symmetric pulses. No edema, clubbing or cyanosis Neurologic: normal ambulation, Motor symmetric without deficits, sensory intact Skin: warm, dry, no rashes     ED Course:  Patient denies any medical concerns and is currently hemodynamically stable. He states he was brought here by someone from the crisis Center for "" mental health evaluation "". Patient admits to feeling depressed but is not suicidal, homicidal, or having any current hallucinations. His vital signs are normal and he doesn't appear to be in active alcohol withdrawal. I felt he was stable for inpatient treatment and currently TTS is working with the patient to establish him in a treatment program. I felt psychiatric consult was not necessary at this time  Assessment:  Polysubstance abuse Request for treatment program     Plan:  Outpatient management            Jennye MoccasinBrian S Prem Coykendall, MD 11/20/15 2247

## 2015-11-20 NOTE — Discharge Instructions (Signed)
Polysubstance Abuse °When people abuse more than one drug or type of drug it is called polysubstance or polydrug abuse. For example, many smokers also drink alcohol. This is one form of polydrug abuse. Polydrug abuse also refers to the use of a drug to counteract an unpleasant effect produced by another drug. It may also be used to help with withdrawal from another drug. People who take stimulants may become agitated. Sometimes this agitation is countered with a tranquilizer. This helps protect against the unpleasant side effects. Polydrug abuse also refers to the use of different drugs at the same time.  °Anytime drug use is interfering with normal living activities, it has become abuse. This includes problems with family and friends. Psychological dependence has developed when your mind tells you that the drug is needed. This is usually followed by physical dependence which has developed when continuing increases of drug are required to get the same feeling or "high". This is known as addiction or chemical dependency. A person's risk is much higher if there is a history of chemical dependency in the family. °SIGNS OF CHEMICAL DEPENDENCY °· You have been told by friends or family that drugs have become a problem. °· You fight when using drugs. °· You are having blackouts (not remembering what you do while using). °· You feel sick from using drugs but continue using. °· You lie about use or amounts of drugs (chemicals) used. °· You need chemicals to get you going. °· You are suffering in work performance or in school because of drug use. °· You get sick from use of drugs but continue to use anyway. °· You need drugs to relate to people or feel comfortable in social situations. °· You use drugs to forget problems. °"Yes" answered to any of the above signs of chemical dependency indicates there are problems. The longer the use of drugs continues, the greater the problems will become. °If there is a family history of  drug or alcohol use, it is best not to experiment with these drugs. Continual use leads to tolerance. After tolerance develops more of the drug is needed to get the same feeling. This is followed by addiction. With addiction, drugs become the most important part of life. It becomes more important to take drugs than participate in the other usual activities of life. This includes relating to friends and family. Addiction is followed by dependency. Dependency is a condition where drugs are now needed not just to get high, but to feel normal. °Addiction cannot be cured but it can be stopped. This often requires outside help and the care of professionals. Treatment centers are listed in the yellow pages under: Cocaine, Narcotics, and Alcoholics Anonymous. Most hospitals and clinics can refer you to a specialized care center. Talk to your caregiver if you need help. °  °This information is not intended to replace advice given to you by your health care provider. Make sure you discuss any questions you have with your health care provider. °  °Document Released: 03/03/2005 Document Revised: 10/04/2011 Document Reviewed: 07/17/2014 °Elsevier Interactive Patient Education ©2016 [employer] ° °

## 2015-11-20 NOTE — ED Notes (Signed)
Pt here for alcohol, meth, and cocaine detox. Pt denies any SI or Hi states has not used today or had alcohol.

## 2015-11-20 NOTE — ED Notes (Signed)
Pt states he wants to detox, reports last use was 24hrs ago.

## 2015-11-20 NOTE — [facility] Counselor (Signed)
Pt presents to the ED for alcohol, meth, and cocaine detox. Pt denies any SI or HI.  TTS counselor provided pt with contact info for [facility].  With patient's consent,  TTS counselor faxed pt's vitals and facesheet to [facility]; waiting for review.

## 2015-12-07 ENCOUNTER — Emergency Department
Admission: EM | Admit: 2015-12-07 | Discharge: 2015-12-08 | Disposition: A | Discharge status: Home / Self Care | Payer: No Typology Code available for payment source | Attending: Emergency Medicine | Admitting: Emergency Medicine

## 2015-12-07 ENCOUNTER — Emergency Department: Payer: Self-pay

## 2015-12-07 ENCOUNTER — Encounter: Payer: Self-pay | Admitting: Emergency Medicine

## 2015-12-07 DIAGNOSIS — F1220 Cannabis dependence, uncomplicated: Secondary | ICD-10-CM | POA: Diagnosis present

## 2015-12-07 DIAGNOSIS — B20 Human immunodeficiency virus [HIV] disease: Secondary | ICD-10-CM | POA: Insufficient documentation

## 2015-12-07 DIAGNOSIS — F329 Major depressive disorder, single episode, unspecified: Secondary | ICD-10-CM | POA: Insufficient documentation

## 2015-12-07 DIAGNOSIS — Y929 Unspecified place or not applicable: Secondary | ICD-10-CM | POA: Insufficient documentation

## 2015-12-07 DIAGNOSIS — F17200 Nicotine dependence, unspecified, uncomplicated: Secondary | ICD-10-CM | POA: Insufficient documentation

## 2015-12-07 DIAGNOSIS — Z21 Asymptomatic human immunodeficiency virus [HIV] infection status: Secondary | ICD-10-CM | POA: Diagnosis present

## 2015-12-07 DIAGNOSIS — F19959 Other psychoactive substance use, unspecified with psychoactive substance-induced psychotic disorder, unspecified: Secondary | ICD-10-CM | POA: Diagnosis present

## 2015-12-07 DIAGNOSIS — Y9389 Activity, other specified: Secondary | ICD-10-CM | POA: Insufficient documentation

## 2015-12-07 DIAGNOSIS — Z79899 Other long term (current) drug therapy: Secondary | ICD-10-CM | POA: Insufficient documentation

## 2015-12-07 DIAGNOSIS — F1010 Alcohol abuse, uncomplicated: Secondary | ICD-10-CM | POA: Insufficient documentation

## 2015-12-07 DIAGNOSIS — S0093XA Contusion of unspecified part of head, initial encounter: Secondary | ICD-10-CM | POA: Insufficient documentation

## 2015-12-07 DIAGNOSIS — Y999 Unspecified external cause status: Secondary | ICD-10-CM | POA: Insufficient documentation

## 2015-12-07 DIAGNOSIS — F1420 Cocaine dependence, uncomplicated: Secondary | ICD-10-CM | POA: Diagnosis present

## 2015-12-07 DIAGNOSIS — F1029 Alcohol dependence with unspecified alcohol-induced disorder: Secondary | ICD-10-CM

## 2015-12-07 DIAGNOSIS — X58XXXA Exposure to other specified factors, initial encounter: Secondary | ICD-10-CM | POA: Insufficient documentation

## 2015-12-07 DIAGNOSIS — R45851 Suicidal ideations: Secondary | ICD-10-CM | POA: Insufficient documentation

## 2015-12-07 DIAGNOSIS — F1290 Cannabis use, unspecified, uncomplicated: Secondary | ICD-10-CM | POA: Insufficient documentation

## 2015-12-07 DIAGNOSIS — F19951 Other psychoactive substance use, unspecified with psychoactive substance-induced psychotic disorder with hallucinations: Secondary | ICD-10-CM

## 2015-12-07 LAB — COMPREHENSIVE METABOLIC PANEL
ALT: 25 U/L (ref 17–63)
AST: 36 U/L (ref 15–41)
Albumin: 4 g/dL (ref 3.5–5.0)
Alkaline Phosphatase: 54 U/L (ref 38–126)
Anion gap: 7 (ref 5–15)
BUN: 8 mg/dL (ref 6–20)
CHLORIDE: 110 mmol/L (ref 101–111)
CO2: 23 mmol/L (ref 22–32)
CREATININE: 1.15 mg/dL (ref 0.61–1.24)
Calcium: 9 mg/dL (ref 8.9–10.3)
Glucose, Bld: 93 mg/dL (ref 65–99)
POTASSIUM: 2.9 mmol/L — AB (ref 3.5–5.1)
Sodium: 140 mmol/L (ref 135–145)
TOTAL PROTEIN: 8.1 g/dL (ref 6.5–8.1)
Total Bilirubin: 0.5 mg/dL (ref 0.3–1.2)

## 2015-12-07 LAB — CBC WITH DIFFERENTIAL/PLATELET
BASOS ABS: 0 10*3/uL (ref 0–0.1)
Eosinophils Absolute: 0 10*3/uL (ref 0–0.7)
HCT: 43.9 % (ref 40.0–52.0)
Hemoglobin: 14.7 g/dL (ref 13.0–18.0)
Lymphs Abs: 0.3 10*3/uL — ABNORMAL LOW (ref 1.0–3.6)
MCH: 30.1 pg (ref 26.0–34.0)
MCHC: 33.5 g/dL (ref 32.0–36.0)
MCV: 90 fL (ref 80.0–100.0)
Monocytes Absolute: 0.2 10*3/uL (ref 0.2–1.0)
Monocytes Relative: 12 %
NEUTROS ABS: 1.4 10*3/uL (ref 1.4–6.5)
Neutrophils Relative %: 71 %
PLATELETS: 84 10*3/uL — AB (ref 150–440)
RBC: 4.87 MIL/uL (ref 4.40–5.90)
RDW: 15.1 % — ABNORMAL HIGH (ref 11.5–14.5)
WBC: 2 10*3/uL — AB (ref 3.8–10.6)

## 2015-12-07 LAB — URINALYSIS COMPLETE WITH MICROSCOPIC (ARMC ONLY)
BILIRUBIN URINE: NEGATIVE
Bacteria, UA: NONE SEEN
GLUCOSE, UA: NEGATIVE mg/dL
KETONES UR: NEGATIVE mg/dL
LEUKOCYTES UA: NEGATIVE
NITRITE: NEGATIVE
PH: 6 (ref 5.0–8.0)
Protein, ur: 30 mg/dL — AB
SPECIFIC GRAVITY, URINE: 1.012 (ref 1.005–1.030)
Squamous Epithelial / LPF: NONE SEEN

## 2015-12-07 LAB — SALICYLATE LEVEL

## 2015-12-07 LAB — URINE DRUG SCREEN, QUALITATIVE (ARMC ONLY)
AMPHETAMINES, UR SCREEN: NOT DETECTED
BENZODIAZEPINE, UR SCRN: NOT DETECTED
Barbiturates, Ur Screen: NOT DETECTED
Cannabinoid 50 Ng, Ur ~~LOC~~: POSITIVE — AB
Cocaine Metabolite,Ur ~~LOC~~: POSITIVE — AB
MDMA (ECSTASY) UR SCREEN: NOT DETECTED
METHADONE SCREEN, URINE: NOT DETECTED
Opiate, Ur Screen: NOT DETECTED
PHENCYCLIDINE (PCP) UR S: NOT DETECTED
Tricyclic, Ur Screen: NOT DETECTED

## 2015-12-07 LAB — ETHANOL: ALCOHOL ETHYL (B): 83 mg/dL — AB (ref <5)

## 2015-12-07 LAB — ACETAMINOPHEN LEVEL

## 2015-12-07 MED ORDER — POTASSIUM CHLORIDE CRYS ER 20 MEQ PO TBCR
40.0000 meq | EXTENDED_RELEASE_TABLET | Freq: Once | ORAL | Status: AC
  Administered 2015-12-07: 40 meq via ORAL
  Filled 2015-12-07: qty 2

## 2015-12-07 MED ORDER — QUETIAPINE FUMARATE 25 MG PO TABS
100.0000 mg | ORAL_TABLET | Freq: Every day | ORAL | Status: DC
  Administered 2015-12-07: 100 mg via ORAL
  Filled 2015-12-07: qty 4

## 2015-12-07 MED ORDER — ONDANSETRON 4 MG PO TBDP
4.0000 mg | ORAL_TABLET | Freq: Once | ORAL | Status: AC
  Administered 2015-12-07: 4 mg via ORAL
  Filled 2015-12-07: qty 1

## 2015-12-07 NOTE — BH Assessment (Signed)
Per Dr. Clapacs patient will remain in the ED overnight and will be reassessed in the morning.   

## 2015-12-07 NOTE — Consult Note (Signed)
[facility] Face-to-Face Psychiatry Consult   Reason for Consult:  Consult for this 30 year old man who is in the emergency room currently under petition because of alleged suicidal statements related to substance abuse. Referring Physician:  Burlene Arnt Patient Identification: Dustin Bennett MRN:  [id number] Principal Diagnosis: Drug psychosis [place]) Diagnosis:   Patient Active Problem List   Diagnosis Date Noted  . Abscess of antecubital fossa [L02.419] 11/06/2015  . Substance induced mood disorder ([place]) [F19.94] 11/06/2015  . 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 Marijuana abuse [F12.10] 07/25/2015  . Amphetamine abuse [F15.10] 07/25/2015  . Cocaine abuse [F14.10] 07/25/2015  . HIV positive ([place]) [Z21] 07/25/2015    Total Time spent with patient: 1 hour  Subjective:   Dustin Bennett is a 30 y.o. male patient admitted with "I just need some kind of detox or something".  HPI:  Patient interviewed. Chart reviewed including my psychiatric note from just a few weeks ago. Labs and vitals reviewed. Case discussed with TTS Ms. Johnnye Sima and Dr. Burlene Arnt in the emergency room. 30 year old man whose chief complaint is that he is feeling paranoid and he needs some kind of detox. He says he is feeling paranoid nervous and anxious. He hasn't been able to sleep in many days. He has auditory hallucinations at times that he can't really describe well. He says he's been doing drugs heavily as much as he can. Smoking crack cocaine a great deal. Using marijuana daily. Has been drinking heavily as well. Not currently taking any psychiatric medicine. Also not following up with taking care of his HIV prescription. He has burned some bridges now with his family and says that his mother doesn't want him staying around with his current behavior. It was alleged that he had made some threatening statements to her but he has denied that to me and it is also alleged that he had said something about suicidality but he denies  to me having any wish to die or plan to hurt himself.  Social history: Patient relatively recently re-transplanted back to the area. 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 He has extended family in the area who had been supportive of him but as noted above he sounds and he is burning some bridges.  Medical history: Patient is HIV positive. He understands what that means any knows he should be taking some medicine. It's not clear to me whether he's really even got looked up with a provider down here in the area. He denies ever having had an AIDS defining illness and says that he thinks his last blood counts were in the normal range.  Substance abuse history: Long history of abuse of multiple drugs with amphetamines having long been a drug of choice. Recently he's been smoking a lot of cocaine. Also uses marijuana and drinks. No history of seizures or delirium tremens. He has had multiple episodes of drug-induced psychosis that seemed to go away pretty quickly once he sobers up.  Past Psychiatric History: Patient says he has had brief psychiatric hospitalizations in the past and was on at least one admission prescribes Seroquel which helped with his anxiety and helped him to sleep but has not been following up with it. Denies any past suicidal behavior. Denies any past history of violence.  Risk to Self: Suicidal Ideation: Yes-Currently Present Suicidal Intent: No Is patient at risk for suicide?: No Suicidal Plan?: No Access to Means: No What has been your use  of drugs/alcohol within the last 12 months?: Cocaine and Alcohol How many times?: 0 Other Self Harm Risks: Abusing cocaine and alcohol Triggers for Past Attempts: None known Intentional Self Injurious Behavior: None Risk to Others: Homicidal Ideation: No Thoughts of Harm to Others: No Current Homicidal Intent: No Current Homicidal Plan: No Access to Homicidal Means:  No Identified Victim: None Reported' History of harm to others?: No Assessment of Violence: None Noted Violent Behavior Description: Calm  Does patient have access to weapons?: No Criminal Charges Pending?: No Does patient have a court date: No Prior Inpatient Therapy: Prior Inpatient Therapy: Yes Prior Therapy Dates: 2017 Prior Therapy Facilty/Provider(s): [facility] Reason for Treatment: Depression and Substance Abuse Prior Outpatient Therapy: Prior Outpatient Therapy: Yes Prior Therapy Dates: 2017 Prior Therapy Facilty/Provider(s): Patient can't remember the name Reason for Treatment: Depression Does patient have an ACCT team?: No Does patient have Intensive In-House Services?  : No Does patient have [facility] services? : No Does patient have P4CC services?: No  Past Medical History:  Past Medical History  Diagnosis Date  . HIV (human immunodeficiency virus infection) [facility])     Past Surgical History  Procedure Laterality Date  . Lung biopsy     Family History: History reviewed. No pertinent family history. Family Psychiatric  History: Patient says he is not aware of any mental health problems in his family Social History:  History  Alcohol Use  . 3.6 oz/week  . 6 Cans of beer per week     History  Drug Use  . Yes  . Special: Marijuana, IV, Methamphetamines    Social History   Social History  . Marital Status: Single    Spouse Name: N/A  . Number of Children: N/A  . Years of Education: N/A   Social History Main Topics  . Smoking status: Current Every Day Smoker  . Smokeless tobacco: None  . Alcohol Use: 3.6 oz/week    6 Cans of beer per week  . Drug Use: Yes    Special: Marijuana, IV, Methamphetamines  . Sexual Activity: Not Asked   Other Topics Concern  . None   Social History Narrative   Additional Social History:    Allergies:  No Known Allergies  Labs:  Results for orders placed or performed during the hospital encounter of 12/07/15  (from the past 48 hour(s))  Salicylate level     Status: None   Collection Time: 12/07/15  8:31 AM  Result Value Ref Range   Salicylate Lvl <9.3 2.8 - 30.0 mg/dL  Acetaminophen level     Status: Abnormal   Collection Time: 12/07/15  8:31 AM  Result Value Ref Range   Acetaminophen (Tylenol), Serum <10 (L) 10 - 30 ug/mL    Comment:        THERAPEUTIC CONCENTRATIONS VARY SIGNIFICANTLY. A RANGE OF 10-30 ug/mL MAY BE AN EFFECTIVE CONCENTRATION FOR MANY PATIENTS. HOWEVER, SOME ARE BEST TREATED AT CONCENTRATIONS OUTSIDE THIS RANGE. ACETAMINOPHEN CONCENTRATIONS >150 ug/mL AT 4 HOURS AFTER INGESTION AND >50 ug/mL AT 12 HOURS AFTER INGESTION ARE OFTEN ASSOCIATED WITH TOXIC REACTIONS.   Comprehensive metabolic panel     Status: Abnormal   Collection Time: 12/07/15  8:31 AM  Result Value Ref Range   Sodium 140 135 - 145 mmol/L   Potassium 2.9 (LL) 3.5 - 5.1 mmol/L    Comment: CRITICAL RESULT CALLED TO, READ BACK BY AND VERIFIED WITH ANGELA ROBBINS ON 12/07/15 AT 0920 BY [facility]    Chloride 110 101 - 111  mmol/L   CO2 23 22 - 32 mmol/L   Glucose, Bld 93 65 - 99 mg/dL   BUN 8 6 - 20 mg/dL   Creatinine, Ser 1.15 0.61 - 1.24 mg/dL   Calcium 9.0 8.9 - 10.3 mg/dL   Total Protein 8.1 6.5 - 8.1 g/dL   Albumin 4.0 3.5 - 5.0 g/dL   AST 36 15 - 41 U/L   ALT 25 17 - 63 U/L   Alkaline Phosphatase 54 38 - 126 U/L   Total Bilirubin 0.5 0.3 - 1.2 mg/dL   GFR calc non Af Amer >60 >60 mL/min   GFR calc Af Amer >60 >60 mL/min    Comment: (NOTE) The eGFR has been calculated using the CKD EPI equation. This calculation has not been validated in all clinical situations. eGFR's persistently <60 mL/min signify possible Chronic Kidney Disease.    Anion gap 7 5 - 15  Ethanol     Status: Abnormal   Collection Time: 12/07/15  8:31 AM  Result Value Ref Range   Alcohol, Ethyl (B) 83 (H) <5 mg/dL    Comment:        LOWEST DETECTABLE LIMIT FOR SERUM ALCOHOL IS 5 mg/dL FOR MEDICAL PURPOSES ONLY   CBC  with Differential     Status: Abnormal   Collection Time: 12/07/15  8:31 AM  Result Value Ref Range   WBC 2.0 (L) 3.8 - 10.6 K/uL   RBC 4.87 4.40 - 5.90 MIL/uL   Hemoglobin 14.7 13.0 - 18.0 g/dL   HCT 43.9 40.0 - 52.0 %   MCV 90.0 80.0 - 100.0 fL   MCH 30.1 26.0 - 34.0 pg   MCHC 33.5 32.0 - 36.0 g/dL   RDW 15.1 (H) 11.5 - 14.5 %   Platelets 84 (L) 150 - 440 K/uL   Neutrophils Relative % 71% %   Neutro Abs 1.4 1.4 - 6.5 K/uL   Lymphocytes Relative 16% %   Lymphs Abs 0.3 (L) 1.0 - 3.6 K/uL   Monocytes Relative 12% %   Monocytes Absolute 0.2 0.2 - 1.0 K/uL   Eosinophils Relative 0% %   Eosinophils Absolute 0.0 0 - 0.7 K/uL   Basophils Relative 1% %   Basophils Absolute 0.0 0 - 0.1 K/uL  Urine Drug Screen, Qualitative     Status: Abnormal   Collection Time: 12/07/15  8:31 AM  Result Value Ref Range   Tricyclic, Ur Screen NONE DETECTED NONE DETECTED   Amphetamines, Ur Screen NONE DETECTED NONE DETECTED   MDMA (Ecstasy)Ur Screen NONE DETECTED NONE DETECTED   Cocaine Metabolite,Ur [place] POSITIVE (A) NONE DETECTED   Opiate, Ur Screen NONE DETECTED NONE DETECTED   Phencyclidine (PCP) Ur S NONE DETECTED NONE DETECTED   Cannabinoid 50 Ng, Ur [place] POSITIVE (A) NONE DETECTED   Barbiturates, Ur Screen NONE DETECTED NONE DETECTED   Benzodiazepine, Ur Scrn NONE DETECTED NONE DETECTED   Methadone Scn, Ur NONE DETECTED NONE DETECTED    Comment: (NOTE) 761  Tricyclics, urine               Cutoff 1000 ng/mL 200  Amphetamines, urine             Cutoff 1000 ng/mL 300  MDMA (Ecstasy), urine           Cutoff 500 ng/mL 400  Cocaine Metabolite, urine       Cutoff 300 ng/mL 500  Opiate, urine  Cutoff 300 ng/mL 600  Phencyclidine (PCP), urine      Cutoff 25 ng/mL 700  Cannabinoid, urine              Cutoff 50 ng/mL 800  Barbiturates, urine             Cutoff 200 ng/mL 900  Benzodiazepine, urine           Cutoff 200 ng/mL 1000 Methadone, urine                Cutoff 300 ng/mL 1100 1200  The urine drug screen provides only a preliminary, unconfirmed 1300 analytical test result and should not be used for non-medical 1400 purposes. Clinical consideration and professional judgment should 1500 be applied to any positive drug screen result due to possible 1600 interfering substances. A more specific alternate chemical method 1700 must be used in order to obtain a confirmed analytical result.  1800 Gas chromato graphy / mass spectrometry (GC/MS) is the preferred 1900 confirmatory method.   Urinalysis complete, with microscopic     Status: Abnormal   Collection Time: 12/07/15  8:31 AM  Result Value Ref Range   Color, Urine YELLOW (A) YELLOW   APPearance CLEAR (A) CLEAR   Glucose, UA NEGATIVE NEGATIVE mg/dL   Bilirubin Urine NEGATIVE NEGATIVE   Ketones, ur NEGATIVE NEGATIVE mg/dL   Specific Gravity, Urine 1.012 1.005 - 1.030   Hgb urine dipstick 1+ (A) NEGATIVE   pH 6.0 5.0 - 8.0   Protein, ur 30 (A) NEGATIVE mg/dL   Nitrite NEGATIVE NEGATIVE   Leukocytes, UA NEGATIVE NEGATIVE   RBC / HPF 0-5 0 - 5 RBC/hpf   WBC, UA 0-5 0 - 5 WBC/hpf   Bacteria, UA NONE SEEN NONE SEEN   Squamous Epithelial / LPF NONE SEEN NONE SEEN   Mucous PRESENT    Hyaline Casts, UA PRESENT     Current Facility-Administered Medications  Medication Dose Route Frequency Provider Last Rate Last Dose  . QUEtiapine (SEROQUEL) tablet 100 mg  100 mg Oral QHS Gonzella Lex, MD       Current Outpatient Prescriptions  Medication Sig Dispense Refill  . ondansetron (ZOFRAN ODT) 4 MG disintegrating tablet Take 1 tablet (4 mg total) by mouth every 6 (six) hours as needed for nausea or vomiting. 15 tablet 0  . penicillin v potassium (VEETID) 500 MG tablet Take 1 tablet (500 mg total) by mouth 4 (four) times daily. 40 tablet 0  . QUEtiapine (SEROQUEL) 100 MG tablet Take 1 tablet (100 mg total) by mouth at bedtime. 30 tablet 0  . sulfamethoxazole-trimethoprim (BACTRIM DS,SEPTRA DS) 800-160 MG tablet Take 2  tablets by mouth 2 (two) times daily. 40 tablet 0  . sulfamethoxazole-trimethoprim (BACTRIM DS,SEPTRA DS) 800-160 MG tablet Take 1 tablet by mouth daily. Begin taking this Bactrim after the twice a day medication is complete. 30 tablet 0    Musculoskeletal: Strength & Muscle Tone: within normal limits Gait & Station: normal Patient leans: N/A  Psychiatric Specialty Exam: Review of Systems  Constitutional: Positive for malaise/fatigue.  HENT: Negative.   Eyes: Negative.   Respiratory: Negative.   Cardiovascular: Negative.   Gastrointestinal: Negative.   Musculoskeletal: Positive for myalgias.  Skin: Negative.   Neurological: Positive for weakness.  Psychiatric/Behavioral: Positive for depression, hallucinations and substance abuse. Negative for suicidal ideas and memory loss. The patient is nervous/anxious and has insomnia.     Blood pressure 137/81, pulse 89, temperature 98.2 F (36.8 C), temperature source Oral, resp. rate  18, SpO2 99 %.There is no weight on file to calculate BMI.  General Appearance: Fairly Groomed  Engineer, water::  None  Speech:  Slow and Slurred  Volume:  Decreased  Mood:  Dysphoric  Affect:  Constricted  Thought Process:  Circumstantial  Orientation:  Full (Time, Place, and Person)  Thought Content:  Hallucinations: Auditory  Suicidal Thoughts:  No  Homicidal Thoughts:  No  Memory:  Immediate;   Good Recent;   Fair Remote;   Fair  Judgement:  Impaired  Insight:  Fair  Psychomotor Activity:  Psychomotor Retardation  Concentration:  Fair  Recall:  [place] of Knowledge:Good  Language: Good  Akathisia:  No  Handed:  Right  AIMS (if indicated):     Assets:  Communication Skills Desire for Improvement Housing Resilience Social Support  ADL's:  Intact  Cognition: WNL  Sleep:      Treatment Plan Summary: Plan 30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 I remember seeing him back in April and it seemed pretty clear to me at that time that he  did not have a psychotic disorder separate from his substance abuse problem. Patient is not currently threatening suicide. He understands that he mostly needs to get off the drugs. Nevertheless he looks like he is in pretty bad shape right now and can't really take care of himself immediately. He does not meet commitment criteria. I will take him off involuntary commitment. I have reviewed the case with TTS and suggested that we look into an observation bed in [place]. If one is available he can be transferred as soon as possible. If it is not we can reassess him in the morning after he will have rested up. No indication for any specific medicine right now.  Disposition: Supportive therapy provided about ongoing stressors.  Alethia Berthold, MD 12/07/2015 1:17 PM

## 2015-12-07 NOTE — ED Notes (Signed)
Dr Toni AmendLapacs has already rescinded IVC

## 2015-12-07 NOTE — ED Notes (Signed)
Meal tray given to patient by rover  

## 2015-12-07 NOTE — ED Notes (Signed)
Report was received from Noel W., RN; Pt. Verbalizes no complaints or distress; denies S.I./Hi. Continue to monitor with 15 min. Monitoring. 

## 2015-12-07 NOTE — ED Notes (Signed)
Called and spoke with Annice PihJackie in dietary and requested breakfast tray for patient .

## 2015-12-07 NOTE — ED Notes (Signed)

## 2015-12-07 NOTE — ED Notes (Signed)
Patient transported to CT, ODS officer with patient

## 2015-12-07 NOTE — ED Notes (Signed)
Dr Toni Amendlapacs paged for IVC rescind
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
## 2015-12-07 NOTE — ED Notes (Signed)
Dr. Alphonzo LemmingsMcShane informed of critical potassium level of 2.9. Orders put in for oral potassium.

## 2015-12-07 NOTE — ED Notes (Signed)
Patient returned from CT
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
## 2015-12-07 NOTE — ED Notes (Signed)
Dr. Toni Amendlapacs currently at bedside with patient

## 2015-12-07 NOTE — ED Notes (Signed)
Report given to Noel, RN 

## 2015-12-08 ENCOUNTER — Inpatient Hospital Stay
Admission: EM | Admit: 2015-12-08 | Discharge: 2015-12-13 | DRG: 896 | Disposition: A | Discharge status: Home / Self Care | Payer: No Typology Code available for payment source | Source: Intra-hospital | Attending: Psychiatry | Admitting: Psychiatry

## 2015-12-08 DIAGNOSIS — F19951 Other psychoactive substance use, unspecified with psychoactive substance-induced psychotic disorder with hallucinations: Secondary | ICD-10-CM | POA: Diagnosis not present

## 2015-12-08 DIAGNOSIS — R45851 Suicidal ideations: Secondary | ICD-10-CM | POA: Diagnosis present

## 2015-12-08 DIAGNOSIS — F17200 Nicotine dependence, unspecified, uncomplicated: Secondary | ICD-10-CM | POA: Diagnosis present

## 2015-12-08 DIAGNOSIS — Z9889 Other specified postprocedural states: Secondary | ICD-10-CM

## 2015-12-08 DIAGNOSIS — F19959 Other psychoactive substance use, unspecified with psychoactive substance-induced psychotic disorder, unspecified: Principal | ICD-10-CM | POA: Diagnosis present

## 2015-12-08 DIAGNOSIS — Z21 Asymptomatic human immunodeficiency virus [HIV] infection status: Secondary | ICD-10-CM | POA: Diagnosis present

## 2015-12-08 DIAGNOSIS — Z9119 Patient's noncompliance with other medical treatment and regimen: Secondary | ICD-10-CM

## 2015-12-08 DIAGNOSIS — B20 Human immunodeficiency virus [HIV] disease: Secondary | ICD-10-CM | POA: Diagnosis present

## 2015-12-08 DIAGNOSIS — G4700 Insomnia, unspecified: Secondary | ICD-10-CM | POA: Diagnosis present

## 2015-12-08 DIAGNOSIS — Z560 Unemployment, unspecified: Secondary | ICD-10-CM

## 2015-12-08 DIAGNOSIS — F1420 Cocaine dependence, uncomplicated: Secondary | ICD-10-CM | POA: Diagnosis present

## 2015-12-08 DIAGNOSIS — F1220 Cannabis dependence, uncomplicated: Secondary | ICD-10-CM | POA: Diagnosis present

## 2015-12-08 DIAGNOSIS — F29 Unspecified psychosis not due to a substance or known physiological condition: Secondary | ICD-10-CM | POA: Diagnosis present

## 2015-12-08 LAB — LIPID PANEL
CHOLESTEROL: 127 mg/dL (ref 0–200)
HDL: 39 mg/dL — ABNORMAL LOW (ref >40)
LDL Cholesterol: 68 mg/dL (ref 0–99)
TRIGLYCERIDES: 98 mg/dL (ref <150)
Total CHOL/HDL Ratio: 3.3 RATIO
VLDL: 20 mg/dL (ref 0–40)

## 2015-12-08 LAB — TSH: TSH: 0.879 u[IU]/mL (ref 0.350–4.500)

## 2015-12-08 MED ORDER — MAGNESIUM HYDROXIDE 400 MG/5ML PO SUSP: 30.0000 mL | Freq: Every day | ORAL | Status: DC | PRN

## 2015-12-08 MED ORDER — ACETAMINOPHEN 325 MG PO TABS: 650.0000 mg | ORAL_TABLET | Freq: Four times a day (QID) | ORAL | Status: DC | PRN

## 2015-12-08 MED ORDER — ALUM & MAG HYDROXIDE-SIMETH 200-200-20 MG/5ML PO SUSP: 30.0000 mL | ORAL | Status: DC | PRN

## 2015-12-08 MED ORDER — QUETIAPINE FUMARATE 100 MG PO TABS
100.0000 mg | ORAL_TABLET | Freq: Every day | ORAL | Status: DC
  Administered 2015-12-08: 100 mg via ORAL
  Filled 2015-12-08: qty 1

## 2015-12-08 NOTE — Tx Team (Signed)
Initial Interdisciplinary Treatment Plan   PATIENT STRESSORS: Financial difficulties Medication change or noncompliance Occupational concerns Substance abuse   PATIENT STRENGTHS: Average or above average intelligence Communication skills Supportive family/friends   PROBLEM LIST: Problem List/Patient Goals Date to be addressed Date deferred Reason deferred Estimated date of resolution  Substance abuse 12/08/2015     Depression 12/08/2015                                                DISCHARGE CRITERIA:  Ability to meet basic life and health needs Adequate post-discharge living arrangements Verbal commitment to aftercare and medication compliance  PRELIMINARY DISCHARGE PLAN: Attend aftercare/continuing care group Return to previous living arrangement  PATIENT/FAMIILY INVOLVEMENT: This treatment plan has been presented to and reviewed with the patient, Beatrix ShipperJeremy J Gunning, and/or family member, .  The patient and family have been given the opportunity to ask questions and make suggestions.  Margo CommonGigi George Briannon Boggio 12/08/2015, 6:31 PM

## 2015-12-08 NOTE — ED Notes (Signed)
Patient asleep in room. No noted distress or abnormal behavior. Will continue 15 minute checks and observation by security cameras for safety. 

## 2015-12-08 NOTE — ED Notes (Signed)
Patient resting quietly in room. No noted distress or abnormal behaviors noted. Will continue 15 minute checks and observation by security camera for safety. 

## 2015-12-08 NOTE — ED Provider Notes (Signed)
-----------------------------------------   5:39 AM on 12/08/2015 -----------------------------------------   Blood pressure 117/67, pulse 61, temperature 97.9 F (36.6 C), temperature source Oral, resp. rate 18, SpO2 98 %.  The patient had no acute events since last update.  Psychiatry is seeing the patient, they have rescinded the patient's involuntary commitment however they state the patient would benefit [facility]. They will reevaluate the patient in the morning for possible transfer [facility] home. Patient is currently here voluntarily.   Minna AntisKevin Salima Rumer, MD 12/08/15 [id number]

## 2015-12-08 NOTE — Consult Note (Signed)
[facility] Consult   Reason for Consult:  Follow-up consult for this 30 year old man with a history of substance abuse and mood instability. He was seen yesterday in the emergency room and at that time was probably still intoxicated and was irritable and unwilling to talk. Reassess today. Referring Physician:  Reita Cliche Patient Identification: Dustin Bennett MRN:  [id number] Principal Diagnosis: Drug psychosis [facility]) Diagnosis:   Patient Active Problem List   Diagnosis Date Noted  . Abscess of antecubital fossa [L02.419] 11/06/2015  . Substance induced mood disorder ([place]) [F19.94] 11/06/2015  . 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 Marijuana abuse [F12.10] 07/25/2015  . Amphetamine abuse [F15.10] 07/25/2015  . Cocaine abuse [F14.10] 07/25/2015  . HIV positive ([place]) [Z21] 07/25/2015    Total Time spent with patient: 1 hour  Subjective:   Dustin Bennett is a 30 y.o. male patient admitted with "nobody is giving me any help".  HPI:  Patient reinterviewed today. He was willing to talk today although still quite unhappy. He says that his mood is been angry and out of control recently. He reports that he almost assaulted his mother. Gets out of control of his behavior easily. Feels depressed. Sleeps poorly. Does not describe hallucinations. Patient was insistent over and over that his substance abuse was not his primary problem and that he needed "help" for his mental health problems. He tells me that no one is giving him any help even the last time he was in the emergency room we referred him to outpatient treatment which he did not follow-up on. Also he did not take the prescription medicine that he was given. Has continued to use cocaine and drink heavily. Patient is labile and agitated today.  Past Psychiatric History: History of mood instability which sounds like it's mostly been related to substance abuse in the past. Heavy substance abuse although he says he's had some  periods of being able to stay stable.  Risk to Self: Suicidal Ideation: Yes-Currently Present Suicidal Intent: No Is patient at risk for suicide?: No Suicidal Plan?: No Access to Means: No What has been your use of drugs/alcohol within the last 12 months?: Cocaine and Alcohol How many times?: 0 Other Self Harm Risks: Abusing cocaine and alcohol Triggers for Past Attempts: None known Intentional Self Injurious Behavior: None Risk to Others: Homicidal Ideation: No Thoughts of Harm to Others: No Current Homicidal Intent: No Current Homicidal Plan: No Access to Homicidal Means: No Identified Victim: None Reported' History of harm to others?: No Assessment of Violence: None Noted Violent Behavior Description: Calm  Does patient have access to weapons?: No Criminal Charges Pending?: No Does patient have a court date: No Prior Inpatient Therapy: Prior Inpatient Therapy: Yes Prior Therapy Dates: 2017 Prior Therapy Facilty/Provider(s): [facility] Reason for Treatment: Depression and Substance Abuse Prior Outpatient Therapy: Prior Outpatient Therapy: Yes Prior Therapy Dates: 2017 Prior Therapy Facilty/Provider(s): Patient can't remember the name Reason for Treatment: Depression Does patient have an ACCT team?: No Does patient have Intensive In-House Services?  : No Does patient have [facility] services? : No Does patient have P4CC services?: No  Past Medical History:  Past Medical History  Diagnosis Date  . HIV (human immunodeficiency virus infection) [facility])     Past Surgical History  Procedure Laterality Date  . Lung biopsy     Family History: History reviewed. No pertinent family history. Family Psychiatric  History: Negative for any kind of psychiatric problem Social History:  History  Alcohol Use  .  3.6 oz/week  . 6 Cans of beer per week     History  Drug Use  . Yes  . Special: Marijuana, IV, Methamphetamines    Social History   Social History  . Marital  Status: Single    Spouse Name: N/A  . Number of Children: N/A  . Years of Education: N/A   Social History Main Topics  . Smoking status: Current Every Day Smoker  . Smokeless tobacco: None  . Alcohol Use: 3.6 oz/week    6 Cans of beer per week  . Drug Use: Yes    Special: Marijuana, IV, Methamphetamines  . Sexual Activity: Not Asked   Other Topics Concern  . None   Social History Narrative   Additional Social History:    Allergies:  No Known Allergies  Labs:  Results for orders placed or performed during the hospital encounter of 12/07/15 (from the past 48 hour(s))  Salicylate level     Status: None   Collection Time: 12/07/15  8:31 AM  Result Value Ref Range   Salicylate Lvl <6.8 2.8 - 30.0 mg/dL  Acetaminophen level     Status: Abnormal   Collection Time: 12/07/15  8:31 AM  Result Value Ref Range   Acetaminophen (Tylenol), Serum <10 (L) 10 - 30 ug/mL    Comment:        THERAPEUTIC CONCENTRATIONS VARY SIGNIFICANTLY. A RANGE OF 10-30 ug/mL MAY BE AN EFFECTIVE CONCENTRATION FOR MANY PATIENTS. HOWEVER, SOME ARE BEST TREATED AT CONCENTRATIONS OUTSIDE THIS RANGE. ACETAMINOPHEN CONCENTRATIONS >150 ug/mL AT 4 HOURS AFTER INGESTION AND >50 ug/mL AT 12 HOURS AFTER INGESTION ARE OFTEN ASSOCIATED WITH TOXIC REACTIONS.   Comprehensive metabolic panel     Status: Abnormal   Collection Time: 12/07/15  8:31 AM  Result Value Ref Range   Sodium 140 135 - 145 mmol/L   Potassium 2.9 (LL) 3.5 - 5.1 mmol/L    Comment: CRITICAL RESULT CALLED TO, READ BACK BY AND VERIFIED WITH ANGELA ROBBINS ON 12/07/15 AT 0920 BY [facility]    Chloride 110 101 - 111 mmol/L   CO2 23 22 - 32 mmol/L   Glucose, Bld 93 65 - 99 mg/dL   BUN 8 6 - 20 mg/dL   Creatinine, Ser 1.15 0.61 - 1.24 mg/dL   Calcium 9.0 8.9 - 10.3 mg/dL   Total Protein 8.1 6.5 - 8.1 g/dL   Albumin 4.0 3.5 - 5.0 g/dL   AST 36 15 - 41 U/L   ALT 25 17 - 63 U/L   Alkaline Phosphatase 54 38 - 126 U/L   Total Bilirubin 0.5 0.3 - 1.2  mg/dL   GFR calc non Af Amer >60 >60 mL/min   GFR calc Af Amer >60 >60 mL/min    Comment: (NOTE) The eGFR has been calculated using the CKD EPI equation. This calculation has not been validated in all clinical situations. eGFR's persistently <60 mL/min signify possible Chronic Kidney Disease.    Anion gap 7 5 - 15  Ethanol     Status: Abnormal   Collection Time: 12/07/15  8:31 AM  Result Value Ref Range   Alcohol, Ethyl (B) 83 (H) <5 mg/dL    Comment:        LOWEST DETECTABLE LIMIT FOR SERUM ALCOHOL IS 5 mg/dL FOR MEDICAL PURPOSES ONLY   CBC with Differential     Status: Abnormal   Collection Time: 12/07/15  8:31 AM  Result Value Ref Range   WBC 2.0 (L) 3.8 - 10.6 K/uL  RBC 4.87 4.40 - 5.90 MIL/uL   Hemoglobin 14.7 13.0 - 18.0 g/dL   HCT 43.9 40.0 - 52.0 %   MCV 90.0 80.0 - 100.0 fL   MCH 30.1 26.0 - 34.0 pg   MCHC 33.5 32.0 - 36.0 g/dL   RDW 15.1 (H) 11.5 - 14.5 %   Platelets 84 (L) 150 - 440 K/uL   Neutrophils Relative % 71% %   Neutro Abs 1.4 1.4 - 6.5 K/uL   Lymphocytes Relative 16% %   Lymphs Abs 0.3 (L) 1.0 - 3.6 K/uL   Monocytes Relative 12% %   Monocytes Absolute 0.2 0.2 - 1.0 K/uL   Eosinophils Relative 0% %   Eosinophils Absolute 0.0 0 - 0.7 K/uL   Basophils Relative 1% %   Basophils Absolute 0.0 0 - 0.1 K/uL  Urine Drug Screen, Qualitative     Status: Abnormal   Collection Time: 12/07/15  8:31 AM  Result Value Ref Range   Tricyclic, Ur Screen NONE DETECTED NONE DETECTED   Amphetamines, Ur Screen NONE DETECTED NONE DETECTED   MDMA (Ecstasy)Ur Screen NONE DETECTED NONE DETECTED   Cocaine Metabolite,Ur [place] POSITIVE (A) NONE DETECTED   Opiate, Ur Screen NONE DETECTED NONE DETECTED   Phencyclidine (PCP) Ur S NONE DETECTED NONE DETECTED   Cannabinoid 50 Ng, Ur [place] POSITIVE (A) NONE DETECTED   Barbiturates, Ur Screen NONE DETECTED NONE DETECTED   Benzodiazepine, Ur Scrn NONE DETECTED NONE DETECTED   Methadone Scn, Ur NONE DETECTED NONE DETECTED    Comment:  (NOTE) 786  Tricyclics, urine               Cutoff 1000 ng/mL 200  Amphetamines, urine             Cutoff 1000 ng/mL 300  MDMA (Ecstasy), urine           Cutoff 500 ng/mL 400  Cocaine Metabolite, urine       Cutoff 300 ng/mL 500  Opiate, urine                   Cutoff 300 ng/mL 600  Phencyclidine (PCP), urine      Cutoff 25 ng/mL 700  Cannabinoid, urine              Cutoff 50 ng/mL 800  Barbiturates, urine             Cutoff 200 ng/mL 900  Benzodiazepine, urine           Cutoff 200 ng/mL 1000 Methadone, urine                Cutoff 300 ng/mL 1100 1200 The urine drug screen provides only a preliminary, unconfirmed 1300 analytical test result and should not be used for non-medical 1400 purposes. Clinical consideration and professional judgment should 1500 be applied to any positive drug screen result due to possible 1600 interfering substances. A more specific alternate chemical method 1700 must be used in order to obtain a confirmed analytical result.  1800 Gas chromato graphy / mass spectrometry (GC/MS) is the preferred 1900 confirmatory method.   Urinalysis complete, with microscopic     Status: Abnormal   Collection Time: 12/07/15  8:31 AM  Result Value Ref Range   Color, Urine YELLOW (A) YELLOW   APPearance CLEAR (A) CLEAR   Glucose, UA NEGATIVE NEGATIVE mg/dL   Bilirubin Urine NEGATIVE NEGATIVE   Ketones, ur NEGATIVE NEGATIVE mg/dL   Specific Gravity, Urine 1.012 1.005 - 1.030   Hgb urine  dipstick 1+ (A) NEGATIVE   pH 6.0 5.0 - 8.0   Protein, ur 30 (A) NEGATIVE mg/dL   Nitrite NEGATIVE NEGATIVE   Leukocytes, UA NEGATIVE NEGATIVE   RBC / HPF 0-5 0 - 5 RBC/hpf   WBC, UA 0-5 0 - 5 WBC/hpf   Bacteria, UA NONE SEEN NONE SEEN   Squamous Epithelial / LPF NONE SEEN NONE SEEN   Mucous PRESENT    Hyaline Casts, UA PRESENT     Current Facility-Administered Medications  Medication Dose Route Frequency Provider Last Rate Last Dose  . QUEtiapine (SEROQUEL) tablet 100 mg  100 mg  Oral QHS Gonzella Lex, MD   100 mg at 12/07/15 2145   Current Outpatient Prescriptions  Medication Sig Dispense Refill  . QUEtiapine (SEROQUEL) 100 MG tablet Take 1 tablet (100 mg total) by mouth at bedtime. 30 tablet 0    Musculoskeletal: Strength & Muscle Tone: within normal limits Gait & Station: normal Patient leans: N/A  Psychiatric Specialty Exam: Review of Systems  Constitutional: Negative.   HENT: Negative.   Eyes: Negative.   Respiratory: Negative.   Cardiovascular: Negative.   Gastrointestinal: Positive for nausea.  Musculoskeletal: Negative.   Skin: Negative.   Neurological: Negative.   Psychiatric/Behavioral: Positive for depression, memory loss and substance abuse. Negative for suicidal ideas and hallucinations. The patient is nervous/anxious and has insomnia.     Blood pressure 143/73, pulse 64, temperature 97.9 F (36.6 C), temperature source Oral, resp. rate 18, SpO2 100 %.There is no weight on file to calculate BMI.  General Appearance: Disheveled  Eye Contact::  Minimal  Speech:  Pressured  Volume:  Increased  Mood:  Depressed and Irritable  Affect:  Labile  Thought Process:  Tangential  Orientation:  Full (Time, Place, and Person)  Thought Content:  Paranoid Ideation  Suicidal Thoughts:  Yes.  without intent/plan  Homicidal Thoughts:  Yes.  without intent/plan  Memory:  Immediate;   Good Recent;   Fair Remote;   Fair  Judgement:  Impaired  Insight:  Shallow  Psychomotor Activity:  Normal  Concentration:  Fair  Recall:  [place]: Fair  Akathisia:  No  Handed:  Right  AIMS (if indicated):     Assets:  Desire for Improvement Resilience  ADL's:  Intact  Cognition: WNL  Sleep:      Treatment Plan Summary: Daily contact with patient to assess and evaluate symptoms and progress in treatment, Medication management and Plan Although he had a night to sleep it off today he seems even more angry and irritable. Talking  about getting so angry he will assault his family or hurt himself. Very demanding. Patient will be admitted to the psychiatric ward for further stabilization. Continuous precautions for now. Continue Seroquel at night for mood stability which can be adjusted by treatment team downstairs. Tried to give him more information about substance abuse treatment and he will need to be continually involved in substance abuse treatment in the hospital and at discharge. Patient will probably benefit from some kind of medicine or infectious disease consult to recommend treatment for his HIV disease  Disposition: Recommend psychiatric Inpatient admission when medically cleared.  Alethia Berthold, MD 12/08/2015 2:13 PM

## 2015-12-08 NOTE — Progress Notes (Addendum)
[facility] by Dr. Toni Amendlapacs Attending Physician will be Dr. Jennet MaduroPucilowska.   Patient has [facility] room 302, [facility] Nurse [place].   Intake Paper Work has been signed and placed on patient chart.  ER staff is aware of the admission [facility]( [facility] Sect.; ER MD; Amy Patient's Nurse & Byrd HesselbachMaria Patient Access).   12/08/2015 Cheryl FlashNicole Lorain Keast, MS, NCC, LPCA Therapeutic Triage Specialist

## 2015-12-08 NOTE — ED Notes (Signed)
[facility] Pt will be transferred shortly. All belongings will be sent with patient. Maintained on 15 minute checks and observation by security camera for safety.

## 2015-12-08 NOTE — Progress Notes (Signed)
Pt being reviewed for possible admission to [facility]. H&P and Assessment have been faxed to the BHU for the charge nurse to review and provide bed assignment.      Nicole Krishna Heuer, MS, NCC, LPCA Therapeutic Triage Specialist    

## 2015-12-08 NOTE — Progress Notes (Signed)
Patient pleasant and cooperative during admission assessment. Patient denies SI/HI at this time. Patient denies AVH. Patient informed of fall risk status, fall risk assessed "low" at this time. Patient oriented to unit/staff/room. Patient denies any questions/concerns at this time. Patient safe on unit with Q15 minute checks for safety. Skin assessment & body search done.no contraband found. 

## 2015-12-08 NOTE — ED Notes (Signed)
Pt calm and cooperative. Pt aware he will be admitted to BMU. Pt accepting. Pt stated "I just wanna get help. My family thinks I have a mental problem. I'm not crazy. But I have been here 3 times and I keep coming back. Must be something I'm doing wrong."   Pt has signed voluntary admission paperwork.   No distress noted. Maintained on 15 minute checks and observation by security camera for safety.

## 2015-12-08 NOTE — ED Notes (Signed)
Pt given breakfast tray. Pt denies SI/HI. Pt was polite but did not wish to speak much to this Clinical research associatewriter. Pt wanting to continue to rest. No distress noted. Maintained on 15 minute checks and observation by security camera for safety.

## 2015-12-09 ENCOUNTER — Encounter: Payer: Self-pay | Admitting: Psychiatry

## 2015-12-09 DIAGNOSIS — F19951 Other psychoactive substance use, unspecified with psychoactive substance-induced psychotic disorder with hallucinations: Secondary | ICD-10-CM

## 2015-12-09 DIAGNOSIS — F17200 Nicotine dependence, unspecified, uncomplicated: Secondary | ICD-10-CM | POA: Diagnosis present

## 2015-12-09 LAB — HEMOGLOBIN A1C: HEMOGLOBIN A1C: 5.1 % (ref 4.0–6.0)

## 2015-12-09 MED ORDER — QUETIAPINE FUMARATE 25 MG PO TABS
25.0000 mg | ORAL_TABLET | Freq: Three times a day (TID) | ORAL | Status: DC
  Administered 2015-12-09 – 2015-12-11 (×6): 25 mg via ORAL
  Filled 2015-12-09 (×6): qty 1

## 2015-12-09 MED ORDER — CARBAMAZEPINE 200 MG PO TABS
200.0000 mg | ORAL_TABLET | Freq: Two times a day (BID) | ORAL | Status: DC
  Administered 2015-12-09 – 2015-12-13 (×8): 200 mg via ORAL
  Filled 2015-12-09 (×8): qty 1

## 2015-12-09 MED ORDER — NICOTINE 21 MG/24HR TD PT24
21.0000 mg | MEDICATED_PATCH | Freq: Every day | TRANSDERMAL | Status: DC
  Administered 2015-12-09 – 2015-12-13 (×5): 21 mg via TRANSDERMAL
  Filled 2015-12-09 (×5): qty 1

## 2015-12-09 MED ORDER — QUETIAPINE FUMARATE 200 MG PO TABS
200.0000 mg | ORAL_TABLET | Freq: Every day | ORAL | Status: DC
  Administered 2015-12-09 – 2015-12-10 (×2): 200 mg via ORAL
  Filled 2015-12-09 (×2): qty 1

## 2015-12-09 NOTE — Plan of Care (Signed)
Problem: Safety: Goal: Ability to remain free from injury will improve Outcome: Progressing Pt remains free from harm. Denies withdrawal s/s.  Problem: Education: Goal: Emotional status will improve Outcome: Progressing Pt denies SI at this time. He rates depression 9/10.

## 2015-12-09 NOTE — [facility] Group Notes (Signed)
[facility] LCSW Group Therapy  12/09/2015 11:05 AM  Type of Therapy:  Group Therapy  Participation Level:  Active  Participation Quality:  Intrusive, Monopolizing and Redirectable  Affect:  Anxious and Labile  Cognitive:  Alert, Appropriate and Oriented  Insight:  Improving  Engagement in Therapy:  Engaged  Modes of Intervention:  Discussion, Limit-setting, Socialization and Support  Summary of Progress/Problems: Patient attended and participated in group discussion introducing himself and participated appropriately during an introductory exercise "2 Truths and a Lie". Patient shared that his mom triggers his relapse and negative emotions but he also needs her for support and is ready to make changes and is interested in housing, transportation and any resources that can help him make this change.   Lulu RidingIngle, Kyisha Fowle T, MSW, LCSW 12/09/2015, 11:05 AM

## 2015-12-09 NOTE — Progress Notes (Signed)
States his daily goal is to sleep and refuses goal sheet.

## 2015-12-09 NOTE — Progress Notes (Signed)
Recreation Therapy Notes  Date: 05.16.17 Time: 3:00 pm Location: Craft Room  Group Topic: Self-expression  Goal Area(s) Addresses:  Patient will be able to identify a color that represents each emotion. Patient will verbalize benefit of using art as a means of self-expression. Patient will verbalize one emotion experienced while participating in activity.  Behavioral Response: Attentive, Interactive, Left early  Intervention: The Colors Within Me  Activity: Patients were given a blank face worksheet and instructed to pick a color for each emotion they were experiencing and show on the face how much of that emotion they were experiencing.  Education: LRT educated patients on other forms of self-expression.  Education Outcome: Patient left before LRT educated group.   Clinical Observations/Feedback: Patient completed activity by picking a color for each emotion he was feeling and showing how much of that emotion he was feeling on the worksheet. Patient contributed to group discussion by stating what emotions he was feeling, how his emotions affect his treatment in the hospital, and that his emotions are dynamic. Patient left group at approximately 3:35 pm with social work. Patient did not return to group.  Jacquelynn CreeGreene,Dustin Bennett M, LRT/CTRS 12/09/2015 4:53 PM

## 2015-12-09 NOTE — Tx Team (Signed)
Interdisciplinary Treatment Plan Update (Adult)         Date: 12/09/2015   Time Reviewed: 9:30 AM   Progress in Treatment: Improving Attending groups: Yes  Participating in groups: Yes  Taking medication as prescribed: Yes  Tolerating medication: Yes  Family/Significant other contact made: No, CSw still assessing for appropriate contacts Patient understands diagnosis: Yes  Discussing patient identified problems/goals with staff: Yes  Medical problems stabilized or resolved: Yes  Denies suicidal/homicidal ideation: Yes  Issues/concerns per patient self-inventory: Yes  Other:   New problem(s) identified: N/A   Discharge Plan or Barriers: Pt will discharge to [place] to live with his mother and will follow up with [facility] for medication management, therapy and substance abuse treatment   Reason for Continuation of Hospitalization:   Depression   Anxiety   Medication Stabilization   Comments: N/A   Estimated length of stay: 3-5 days     Patient is ais a 30 y.o. male patient admitted with "nobody is giving me any help".  Patient reinterviewed today. He was willing to talk today although still quite unhappy. He says that his mood is been angry and out of control recently. He reports that he almost assaulted his mother. Gets out of control of his behavior easily. Feels depressed. Sleeps poorly. Does not describe hallucinations. Patient was insistent over and over that his substance abuse was not his primary problem and that he needed "help" for his mental health problems. He tells me that no one is giving him any help even the last time he was in the emergency room we referred him to outpatient treatment which he did not follow-up on. Also he did not take the prescription medicine that he was given. Has continued to use cocaine and drink heavily. Patient is labile and agitated today.  Past Psychiatric History: History of mood instability which sounds like it's mostly been related to  substance abuse in the past. Heavy substance abuse although he says he's had some periods of being able to stay stable. . Patient lives in [place].  Patient will benefit from crisis stabilization, medication evaluation, group therapy, and psycho education in addition to case management for discharge planning. Patient and CSW reviewed pt's identified goals and treatment plan. Pt verbalized understanding and agreed to treatment plan.    Review of initial/current patient goals per problem list:  1. Goal(s): Patient will participate in aftercare plan   Met: Yes  Target date: 3-5 days post admission date   As evidenced by: Patient will participate within aftercare plan AEB aftercare provider and housing plan at discharge being identified.   5/16: Pt will discharge to [place] to live with his mother and will follow up with [facility] for medication management, therapy and substance abuse treatment    2. Goal (s): Patient will exhibit decreased depressive symptoms and suicidal ideations.   Met: No  Target date: 3-5 days post admission date   As evidenced by: Patient will utilize self-rating of depression at 3 or below and demonstrate decreased signs of depression or be deemed stable for discharge by MD.   5/16: Goal progressing.  Pt denies SI/HI.     3. Goal(s): Patient will demonstrate decreased signs and symptoms of anxiety.   Met: No  Target date: 3-5 days post admission date   As evidenced by: Patient will utilize self-rating of anxiety at 3 or below and demonstrated decreased signs of anxiety, or be deemed stable for discharge by MD   5/16: Goal progressing.  4. Goal(s): Patient will demonstrate decreased signs of withdrawal due to substance abuse   Met: No  Target date: 3-5 days post admission date   As evidenced by: Patient will produce a CIWA/COWS score of 0, have stable vitals signs, and no symptoms of withdrawal   5/16: Goal progressing.    5. Goal (s): Patient will  demonstrate decreased signs of mania  * Met: No * Target date: 3-5 days post admission date  * As evidenced by: Patient demonstrate decreased signs of mania AEB decreased mood instability and demonstration of stable mood   5/16: Goal progressing.    Attendees:  Patient: Dustin Bennett Family:  Physician: Dr. Bary Leriche, MD    12/09/2015 9:30 AM  Nursing: Elige Radon, RN     12/09/2015 9:30 AM  Clinical Social Worker: Marylou Flesher, [place]  12/09/2015 9:30 AM  Recreational Therapist: Everitt Amber, LRT  12/09/2015 9:30 AM  Psychologist: Consuella Lose PsyD  12/09/2015 9:30 AM  Other:        12/09/2015 9:30 AM  Other:        12/09/2015 9:30 AM     Alphonse Guild. Demiya Magno, LCSWA, LCAS  12/09/15
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
## 2015-12-09 NOTE — Progress Notes (Addendum)
Patient with depressed affect, withdrawn behavior. No SI/HI at this time. Patient is irritable. Angry after meeting with team of MD, charge nurse and social worker. Withdrawn in room. Minimal interaction with peers. Safety maintained. Patient increasingly visible on unit this afternoon. Remains with irritable affect, engages in staff splitting behavior with male peer and gets security guard to go into his locker for clothes item after staff set limit.

## 2015-12-09 NOTE — [facility] Group Notes (Signed)
[facility]   12/09/2015 10:45 AM  Participation Quality:  Patient attended and participated in group introducing himself and sharing his SMART goal is to "find balance in chemicals" referring to stabilizing on medications. Patient received a daily workbook for Tuesday.  Mood/Affect:  Anxious  Depression Rating:  10  Anxiety Rating:  7  Thoughts of Suicide:  Negative Will you contract for safety?   Yes  Current AVH:  No  Plan for Discharge/Comments:  Return home but would like housing options as patient wants to make changes  Transportation Means:  Patient has family to pick him up  Supports: family is support but also is trigger to his relapse.  Lulu RidingIngle, Denorris Reust T, MSW, LCSW

## 2015-12-09 NOTE — [facility] Group Notes (Signed)
[facility] Group Notes:  (Nursing/MHT/Case Management/Adjunct)  Date:  12/09/2015  Time:  2:19 PM  Type of Therapy:  Psychoeducational Skills  Participation Level:  Active  Participation Quality:  Appropriate  Affect:  Anxious  Cognitive:  Appropriate  Insight:  Appropriate  Engagement in Group:  Engaged  Modes of Intervention:  Discussion and Education  Summary of Progress/Problems:  Mickey Farberamela M Moses Ellison 12/09/2015, 2:19 PM
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
## 2015-12-09 NOTE — Progress Notes (Signed)
D: Pt is seen in the milieu interacting appropriately with staff and peers this evening. He is pleasant and cooperative. Denies SI/HI/AVH at this time. Denies pain. Pt rates depression 9/10 and anxiety 7/10. Pt denies s/s of withdrawal at this time. A: Emotional support and encouragement provided. Medications administered with education. q15 minute safety checks maintained. R: Pt remains free from harm. Will continue to monitor.

## 2015-12-09 NOTE — B[facility] Suicide Risk Assessment (Signed)
[facility]   Nursing information obtained from:    Demographic factors:    Current Mental Status:    Loss Factors:    Historical Factors:    Risk Reduction Factors:     Total Time spent with patient: 1 hour Principal Problem: Drug psychosis (HCC) Diagnosis:   Patient Active Problem List   Diagnosis Date Noted  . Tobacco use disorder [F17.200] 12/09/2015  . Substance induced mood disorder (HCC) [F19.94] 11/06/2015  . 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 Cannabis use disorder, severe, dependence (HCC) [F12.20] 07/25/2015  . Amphetamine abuse [F15.10] 07/25/2015  . Cocaine use disorder, severe, dependence (HCC) [F14.20] 07/25/2015  . HIV positive (HCC) [Z21] 07/25/2015   Subjective Data: Depression, mood instability, suicidal ideation, hallucinations, substance abuse.  Continued Clinical Symptoms:  Alcohol Use Disorder Identification Test Final Score (AUDIT): 5 The "Alcohol Use Disorders Identification Test", Guidelines for Use in Primary Care, Second Edition.  World Science writerHealth Organization [facility](WHO). Score between 0-7:  no or low risk or alcohol related problems. Score between 8-15:  moderate risk of alcohol related problems. Score between 16-19:  high risk of alcohol related problems. Score 20 or above:  warrants further diagnostic evaluation for alcohol dependence and treatment.   CLINICAL FACTORS:   Depression:   Comorbid alcohol abuse/dependence Impulsivity Alcohol/Substance Abuse/Dependencies Currently Psychotic   Musculoskeletal: Strength & Muscle Tone: within normal limits Gait & Station: normal Patient leans: N/A  Psychiatric Specialty Exam: Review of Systems  Psychiatric/Behavioral: Positive for depression, suicidal ideas, hallucinations and substance abuse.  All other systems reviewed and are negative.   Blood pressure 124/71, pulse 51, temperature 97.8 F (36.6 C), temperature source Oral, resp. rate 18, height 5\' 11"  (1.803 m),  weight 75.751 kg (167 lb), SpO2 99 %.Body mass index is 23.3 kg/(m^2).  General Appearance: Casual  Eye Contact::  Good  Speech:  Clear and Coherent  Volume:  Normal  Mood:  Angry, Dysphoric and Irritable  Affect:  Inappropriate  Thought Process:  Circumstantial  Orientation:  Full (Time, Place, and Person)  Thought Content:  Hallucinations: Auditory  Suicidal Thoughts:  Yes.  with intent/plan  Homicidal Thoughts:  No  Memory:  Immediate;   Poor Recent;   Poor Remote;   Poor  Judgement:  Poor  Insight:  Lacking  Psychomotor Activity:  Increased  Concentration:  Poor  Recall:  Poor  Fund of Knowledge:Fair  Language: Fair  Akathisia:  No  Handed:  Right  AIMS (if indicated):     Assets:  Communication Skills Desire for Improvement Housing Physical Health Resilience Social Support  Sleep:  Number of Hours: 7.25  Cognition: WNL  ADL's:  Intact    COGNITIVE FEATURES THAT CONTRIBUTE TO RISK:  None    SUICIDE RISK:   Moderate:  Frequent suicidal ideation with limited intensity, and duration, some specificity in terms of plans, no associated intent, good self-control, limited dysphoria/symptomatology, some risk factors present, and identifiable protective factors, including available and accessible social support.  PLAN OF CARE: Hospital admission, medication management, substance abuse counseling, discharge planning.  Mr. Donavan BurnetGillom has a history of ADHD, mood instability, and substance abuse. He was admitted for suicidal ideation and hallucinations in the context of treatment noncompliance, substance use, and conflict in the community.  1. Suicidal ideation. The patient is able to contract for safety.  2. Mood. He responded well to Seroquel in the past. We will continue but increase the dose. The patient may need another mood stabilizer but he is  too irritable to have a reasonable discussion about medications.  3. Substance abuse. Initially the patient admitted that he has  never been sober since his teenage years. Today he denies extensive substance use and is not interested in treatment. She was positive for alcohol, cocaine and marijuana on admission. He does not seem to require alcohol detox. We'll continue to monitor for symptoms of withdrawal.  4. HIV positive. We will ask infectious disease for consultation. The patient has not been taking medications since returning to [place] due to financial burden.  5. Smoking. We will offer nicotine patch.  6. Disposition. He will likely return to his family. He will follow up with [facility].   I certify that inpatient services furnished can reasonably be expected to improve the patient's condition.   Kristine Linea, MD 12/09/2015, 1:44 PM

## 2015-12-10 LAB — PROLACTIN: PROLACTIN: 11.2 ng/mL (ref 4.0–15.2)

## 2015-12-10 MED ORDER — BACLOFEN 10 MG PO TABS: 10.0000 mg | ORAL_TABLET | Freq: Two times a day (BID) | ORAL | Status: DC

## 2015-12-10 MED ORDER — AZITHROMYCIN 200 MG/5ML PO SUSR
600.0000 mg | ORAL | Status: DC
  Administered 2015-12-10: 600 mg via ORAL
  Filled 2015-12-10: qty 15

## 2015-12-10 MED ORDER — SULFAMETHOXAZOLE-TRIMETHOPRIM 800-160 MG PO TABS
1.0000 | ORAL_TABLET | Freq: Every day | ORAL | Status: DC
  Administered 2015-12-10 – 2015-12-13 (×4): 1 via ORAL
  Filled 2015-12-10 (×4): qty 1

## 2015-12-10 MED ORDER — BACLOFEN 10 MG PO TABS
10.0000 mg | ORAL_TABLET | Freq: Three times a day (TID) | ORAL | Status: DC
  Administered 2015-12-10 – 2015-12-11 (×2): 10 mg via ORAL
  Filled 2015-12-10 (×2): qty 1

## 2015-12-10 NOTE — Progress Notes (Signed)
D: Pt is pleasant and cooperative this evening. He is seen in the milieu interacting with select male peer. Denies SI/HI/AVH at this time. Denies pain. Pt rates depression 7/10 and anxiety 0/10. Pt asks questions appropriately regarding new medications he has been prescribed. A: Emotional support and encouragement provided. Medications administered with education. q15 minute safety checks maintained. R: Pt remains free from harm. Will continue to monitor.

## 2015-12-10 NOTE — Progress Notes (Signed)
Chlamydia/NGC rt PCR ([facility] only)   (Order #: [id number]) Urine Specimen collected and sent to lab.

## 2015-12-10 NOTE — Progress Notes (Signed)
[facility] MD Progress Note  12/10/2015 3:05 PM Dustin Bennett  MRN:  [id number]  Subjective:  Dustin Bennett feels better he is not as irritable or agitated. He is able to participate in the interview. He completed social work assessment and is interested in continuation of treatment in the safe house setting. He was referred to [facility] program. We are awaiting response. He accepted medications and tolerates them well. He reminded me that he was treated with baclofen for his alcoholism.Marland Kitchen He participates well in programming.    Principal Problem: Substance-induced psychotic disorder with hallucinations (HCC) Diagnosis:   Patient Active Problem List   Diagnosis Date Noted  . Tobacco use disorder [F17.200] 12/09/2015  . 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 Cannabis use disorder, severe, dependence (HCC) [F12.20] 07/25/2015  . Amphetamine abuse [F15.10] 07/25/2015  . Cocaine use disorder, severe, dependence (HCC) [F14.20] 07/25/2015  . HIV positive (HCC) [Z21] 07/25/2015   Total Time spent with patient: 20 minutes  Past Psychiatric History: Depression, mood instability, substance abuse.ast Medical History:  Past Medical History  Diagnosis Date  . HIV (human immunodeficiency virus infection) [facility])     Past Surgical History  Procedure Laterality Date  . Lung biopsy     Family History: History reviewed. No pertinent family history. Family Psychiatric  History: See H&P.ial History:  History  Alcohol Use  . 3.6 oz/week  . 6 Cans of beer per week     History  Drug Use  . Yes  . Special: Marijuana, IV, Methamphetamines    Social History   Social History  . Marital Status: Single    Spouse Name: N/A  . Number of Children: N/A  . Years of Education: N/A   Social History Main Topics  . Smoking status: Current Every Day Smoker  . Smokeless tobacco: None  . Alcohol Use: 3.6 oz/week    6 Cans of beer per week  . Drug Use: Yes    Special:  Marijuana, IV, Methamphetamines  . Sexual Activity: Not Asked   Other Topics Concern  . None   Social History Narrative   Additional Social History:                         Sleep: Fair  Appetite:  Fair  Current Medications: Current Facility-Administered Medications  Medication Dose Route Frequency Provider Last Rate Last Dose  . acetaminophen (TYLENOL) tablet 650 mg  650 mg Oral Q6H PRN Audery Amel, MD      . alum & mag hydroxide-simeth (MAALOX/MYLANTA) 200-200-20 MG/5ML suspension 30 mL  30 mL Oral Q4H PRN Audery Amel, MD      . carbamazepine (TEGRETOL) tablet 200 mg  200 mg Oral BID AC & HS Aymen Widrig B Darius Lundberg, MD   200 mg at 12/10/15 0801  . magnesium hydroxide (MILK OF MAGNESIA) suspension 30 mL  30 mL Oral Daily PRN Audery Amel, MD      . nicotine (NICODERM CQ - dosed in mg/24 hours) patch 21 mg  21 mg Transdermal Daily Ayauna Mcnay B Vallory Oetken, MD   21 mg at 12/10/15 0851  . QUEtiapine (SEROQUEL) tablet 200 mg  200 mg Oral QHS Shari Prows, MD   200 mg at 12/09/15 2156  . QUEtiapine (SEROQUEL) tablet 25 mg  25 mg Oral TID Shari Prows, MD   25 mg at 12/10/15 4098    Lab Results:  Results for orders placed or performed during the hospital  encounter of 12/08/15 (from the past 48 hour(s))  Hemoglobin A1c     Status: None   Collection Time: 12/08/15  8:03 PM  Result Value Ref Range   Hgb A1c MFr Bld 5.1 4.0 - 6.0 %  Lipid panel, fasting     Status: Abnormal   Collection Time: 12/08/15  8:03 PM  Result Value Ref Range   Cholesterol 127 0 - 200 mg/dL   Triglycerides 98 <161 mg/dL   HDL 39 (L) >09 mg/dL   Total CHOL/HDL Ratio 3.3 RATIO   VLDL 20 0 - 40 mg/dL   LDL Cholesterol 68 0 - 99 mg/dL    Comment:        Total Cholesterol/HDL:CHD Risk Coronary Heart Disease Risk Table                     Men   Women  1/2 Average Risk   3.4   3.3  Average Risk       5.0   4.4  2 X Average Risk   9.6   7.1  3 X Average Risk  23.4   11.0        Use  the calculated Patient Ratio above and the CHD Risk Table to determine the patient's CHD Risk.        ATP III CLASSIFICATION (LDL):  <100     mg/dL   Optimal  604-540  mg/dL   Near or Above                    Optimal  130-159  mg/dL   Borderline  981-191  mg/dL   High  >478     mg/dL   Very High   Prolactin     Status: None   Collection Time: 12/08/15  8:03 PM  Result Value Ref Range   Prolactin 11.2 4.0 - 15.2 ng/mL    Comment: (NOTE) Performed At: [facility] [address] [place], [place] [id number] Mila Homer MD MV:[id number]   TSH     Status: None   Collection Time: 12/08/15  8:03 PM  Result Value Ref Range   TSH 0.879 0.350 - 4.500 uIU/mL    Blood Alcohol level:  Lab Results  Component Value Date   ETH 83* 12/07/2015   ETH <5 11/06/2015    Physical Findings: AIMS:  , ,  ,  , Dental Status Current problems with teeth and/or dentures?: No Does patient usually wear dentures?: No  CIWA:    COWS:  COWS Total Score: 1  Musculoskeletal: Strength & Muscle Tone: within normal limits Gait & Station: normal Patient leans: N/A  Psychiatric Specialty Exam: Review of Systems  Psychiatric/Behavioral: Positive for substance abuse.  All other systems reviewed and are negative.   Blood pressure 129/86, pulse 71, temperature 97.9 F (36.6 C), temperature source Oral, resp. rate 18, height  (1.803 m), weight 75.751 kg (167 lb), SpO2 99 %.Body mass index is 23.3 kg/(m^2).  General Appearance: Casual  Eye Contact::  Good  Speech:  Clear and Coherent  Volume:  Normal  Mood:  Anxious  Affect:  Appropriate  Thought Process:  Goal Directed  Orientation:  Full (Time, Place, and Person)  Thought Content:  WDL  Suicidal Thoughts:  No  Homicidal Thoughts:  No  Memory:  Immediate;   Fair Recent;   Fair Remote;   Fair  Judgement:  Impaired  Insight:  Shallow  Psychomotor Activity:  Normal  Concentration:  Fair  Recall:  Jennelle HumanFair  Fund of Knowledge:Fair   Language: Fair  Akathisia:  No  Handed:  Right  AIMS (if indicated):     Assets:  Communication Skills Desire for Improvement Housing Physical Health Resilience Social Support  ADL's:  Intact  Cognition: WNL  Sleep:  Number of Hours: 6.75   Treatment Plan Summary: Daily contact with patient to assess and evaluate symptoms and progress in treatment and Medication management   Dustin Bennett has a history of ADHD, mood instability, and substance abuse. He was admitted for suicidal ideation and hallucinations in the context of treatment noncompliance, substance use, and conflict in the community.  1. Suicidal ideation. The patient is able to contract for safety.  2. Mood. He responded well to Seroquel in the past. We started Seroquel and will gradually increase the dose. He has low dose Seroquel during the day for anxiety. We started Tegretol for mood stabilizati session   3. Substance abuse. Initially the patient admitted that he has never been sober since his teenage years. Today he denies extensive substance use and is not interested in treatment. She was positive for alcohol, cocaine and marijuana on admission. He does not seem to require alcohol detox. There are no symptoms of withdrawal. VS are stable. We will restart Baclofen for alcoholism.   4. HIV positive. We will ask infectious disease for consultation. The patient has not been taking medications since returning to [place] due to financial burden.  5. Smoking. We will offer nicotine patch.  6. Metabolic syndrome monitoring. Lipid profile, hemoglobin A1c, and TSH are normal. Prolactin 11.2.  7. Head injury. Head CT scan was performed in the emergency room and was unremarkable.   8. Disposition. TBE. [facility] [place]  or he will follow up with [facility] locally.  Kristine LineaJolanta Melton Walls, MD 12/10/2015, 3:05 PM

## 2015-12-10 NOTE — Plan of Care (Signed)
Problem: Safety: Goal: Periods of time without injury will increase Outcome: Progressing Medications administered as ordered by the physician, medications Therapeutic Effects, SEs and Adverse effects discussed, questions encouraged; no PRN given, 15 minute checks maintained for safety, clinical and moral support provided, patient encouraged to continue to express feelings and demonstrate safe care. Patient remain free from harm, will continue to monitor.         

## 2015-12-10 NOTE — Plan of Care (Signed)
Problem: Physical Regulation: Goal: Complications related to the disease process, condition or treatment will be avoided or minimized Outcome: Progressing Pt denies withdrawal symptoms at this time.  Problem: Health Behavior/Discharge Planning: Goal: Compliance with treatment plan for underlying cause of condition will improve Outcome: Progressing Pt taking medications as prescribed and attending groups. Pt asks questions appropriately regarding medications.

## 2015-12-10 NOTE — Progress Notes (Signed)
D: Patient stated slept good last night .Stated appetite is good and energy level  Is normal. Stated concentration is good . Stated on Depression scale 4 , hopeless 4 and anxiety 4 .( low 0-10 high) Denies suicidal  homicidal ideations  .  No auditory hallucinations  No pain concerns . Appropriate ADL'S. Interacting with peers and staff. Patient voice of goal for today  Staying Positive  And remain positive. A: Encourage patient participation with unit programming . Instruction  Given on  Medication , verbalize understanding. R: Voice no other concerns. Staff continue to monitor a

## 2015-12-10 NOTE — Progress Notes (Signed)
Fixated on Seroquel dosage, visible in the milieu interacting with peers and adjusting well; denied SI/SIB, denied AV/H, poor insight to his disease process and handling of it, "I have a college degree in Cosmetology, I work as a Paediatric nurseBarber. I have an older brother and a Older sister but we don't talk. I came here because I was under the influence of ETOH & MJ and around people with negative energy.Marland Kitchen.Marland Kitchen."

## 2015-12-10 NOTE — BHH Group Notes (Signed)
[facility] Group Notes:  (Nursing/MHT/Case Management/Adjunct)  Date:  12/10/2015  Time:  4:13 PM  Type of Therapy:  Group Therapy  Participation Level:  Active  Participation Quality:  Sharing  Affect:  Appropriate  Cognitive:  Appropriate  Insight:  Good  Engagement in Group:  Engaged  Modes of Intervention:  Support  Summary of Progress/Problems:  Dustin Bennett 12/10/2015, 4:13 PM

## 2015-12-10 NOTE — Progress Notes (Signed)
Recreation Therapy Notes  Date: 05.17.17 Time: 9:30 am Location: Craft Room  Group Topic: Coping Skills  Goal Area(s) Addresses:  Patient will participate in healthy coping skill. Patient will verbalize what they are focused on while coloring.  Behavioral Response: Attentive, Interactive, Inappropriate  Intervention: Coloring  Activity: Patients were given coloring sheets to color and were instructed to think about what they were focused on while they were coloring.  Education: LRT educated patients on healthy coping skills.  Education Outcome: Acknowledges education/In group clarification offered   Clinical Observations/Feedback: Patient colored coloring sheet. Patient appeared frustrated when other patient passed guess. Patient stated, "You can leave the room next time." Later other patient got up and walked to the door and passed gas. Patient stated, "I really hope you shit yourself next time you pass gas like that." LRT redirected patient and patient complied. Patient contributed to group discussion by stating what his mind was focused on while he was coloring.  Jacquelynn CreeGreene,Saafir Abdullah M, LRT/CTRS 12/10/2015 10:27 AM

## 2015-12-10 NOTE — [facility] Group Notes (Signed)
[facility] LCSW Group Therapy  12/10/2015 2:51 PM  Type of Therapy:  Group Therapy  Participation Level:  Active  Participation Quality:  Intrusive, Monopolizing and Redirectable  Affect:  Anxious and Labile  Cognitive:  Disorganized  Insight:  Improving  Engagement in Therapy:  Improving and Monopolizing  Modes of Intervention:  Discussion, Limit-setting, Socialization and Support  Summary of Progress/Problems: Patient attended and participated in group discussion introducing himself and sharing during an introductory exercise that [place] Patient was manic and intrusive but redirectable and was apologetic during group to anther group member for offending her with his words which is progress with patient.   Lulu RidingIngle, Everard Interrante T, MSW, LCSW 12/10/2015, 2:51 PM

## 2015-12-10 NOTE — [facility] Group Notes (Signed)
[facility] Group Notes:  (Nursing/MHT/Case Management/Adjunct)  Date:  12/10/2015  Time:  10:02 PM  Type of Therapy:  Group Therapy  Participation Level:  Active  Participation Quality:  Appropriate  Affect:  Appropriate  Cognitive:  Appropriate  Insight:  Appropriate  Engagement in Group:  Engaged  Modes of Intervention:  Discussion  Summary of Progress/Problems:  Dustin Bennett 12/10/2015, 10:02 PM

## 2015-12-10 NOTE — Plan of Care (Signed)
Problem: Physical Regulation: Goal: Complications related to the disease process, condition or treatment will be avoided or minimized Outcome: Progressing Encourage patient to alert  Nursing staff of any  Concerns around  Withdrawal process.

## 2015-12-11 LAB — CHLAMYDIA/NGC RT PCR (ARMC ONLY)
CHLAMYDIA TR: NOT DETECTED
N GONORRHOEAE: NOT DETECTED

## 2015-12-11 MED ORDER — QUETIAPINE FUMARATE 200 MG PO TABS
300.0000 mg | ORAL_TABLET | Freq: Every day | ORAL | Status: DC
  Administered 2015-12-11 – 2015-12-12 (×2): 300 mg via ORAL
  Filled 2015-12-11 (×2): qty 1

## 2015-12-11 MED ORDER — BACLOFEN 10 MG PO TABS
40.0000 mg | ORAL_TABLET | Freq: Three times a day (TID) | ORAL | Status: DC
  Administered 2015-12-11 – 2015-12-12 (×3): 40 mg via ORAL
  Administered 2015-12-12: 20 mg via ORAL
  Administered 2015-12-13: 40 mg via ORAL
  Filled 2015-12-11 (×5): qty 4

## 2015-12-11 MED ORDER — TUBERCULIN PPD 5 UNIT/0.1ML ID SOLN
5.0000 [IU] | Freq: Once | INTRADERMAL | Status: DC
  Administered 2015-12-11: 5 [IU] via INTRADERMAL
  Filled 2015-12-11: qty 0.1

## 2015-12-11 MED ORDER — QUETIAPINE FUMARATE 25 MG PO TABS
50.0000 mg | ORAL_TABLET | Freq: Three times a day (TID) | ORAL | Status: DC
  Administered 2015-12-11 – 2015-12-13 (×6): 50 mg via ORAL
  Filled 2015-12-11 (×6): qty 2
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
## 2015-12-11 NOTE — Progress Notes (Signed)
Recreation Therapy Notes  Date: 05.18.17 Time: 1:00 pm Location: Craft Room  Group Topic: Leisure Education  Goal Area(s) Addresses:  Patient will identify activities for each letter of the alphabet. Patient will verbalize ability to integrate positive leisure into life post d/c. Patient will verbalize ability to use leisure as a Associate Professorcoping skill.  Behavioral Response: Attentive, Interactive  Intervention: Leisure Alphabet  Activity: Patients were given a Leisure Information systems managerAlphabet worksheet and instructed to pick a leisure activity for each letter of the alphabet.  Education: LRT educated patients on what they need to participate in leisure.  Education Outcome: Acknowledges education/In group clarification offered  Clinical Observations/Feedback: Patient completed activity by writing healthy leisure activities. Patient contributed to group discussion by stating some healthy leisure activities.  Jacquelynn CreeGreene,Demya Scruggs M, LRT/CTRS 12/11/2015 3:17 PM

## 2015-12-11 NOTE — Plan of Care (Signed)
Problem: Coping: Goal: Ability to verbalize frustrations and anger appropriately will improve Outcome: Progressing Able to have insight into behavior rand what he needs to clean up .

## 2015-12-11 NOTE — [facility] Group Notes (Signed)
[facility] LCSW Group Therapy  12/11/2015 10:52 AM  Type of Therapy: Group Therapy  Participation Level: Did Not Attend  Modes of Intervention: Discussion, Education, Socialization and Support  Summary of Progress/Problems: Balance in life: Patients will discuss the concept of balance and how it looks and feels to be unbalanced. Pt will identify areas in their life that is unbalanced and ways to become more balanced.    Dustin Bennett L Suzzane Quilter MSW, LCSWA  12/11/2015, 10:52 AM 

## 2015-12-11 NOTE — Progress Notes (Signed)
D: Patient stated slept good last night .Stated appetite is good and energy level  Is normal. Stated concentration is good . Stated on Depression scale2 , hopeless 2 and anxiety 2  .( low 0-10 high) Denies suicidal  homicidal ideations  .  No auditory hallucinations  Voice of  pain concerns with his back and neck     . Appropriate ADL'S. Interacting with peers and staff. Continue to voice  Of staying  Positive.  A: Encourage patient participation with unit programming . Instruction  Given on  Medication , verbalize understanding. Muscle relaxer  increased R: Voice no other concerns. Staff continue to monitor

## 2015-12-11 NOTE — Progress Notes (Addendum)
[facility]  12/11/2015 12:41 PM Beatrix ShipperJeremy J Klink  MRN:  [id number]  Subjective:  Mr. Donavan BurnetGillom continues to improve. His mood is better, affect is brighter, he is no longer suicidal or homicidal. He takes medications and tolerates them well. Sleep and appetite are fair. He is quite optimistic about the future as [facility] as long as he is compliant with medications, especially for HIV infection. He was seen by Dr. Sampson GoonFitzgerald yesterday. ID consult is greatly appreciated. The patient interacts with peers and staff appropriately. Good group participation.   In the afternoon, he got upset with a peer who offended him but was able to keep his cool.  Principal Problem: Substance-induced psychotic disorder with hallucinations (HCC) Diagnosis:   Patient Active Problem List   Diagnosis Date Noted  . Tobacco use disorder [F17.200] 12/09/2015  . 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 Cannabis use disorder, severe, dependence (HCC) [F12.20] 07/25/2015  . Amphetamine abuse [F15.10] 07/25/2015  . Cocaine use disorder, severe, dependence (HCC) [F14.20] 07/25/2015  . HIV positive (HCC) [Z21] 07/25/2015   Total Time spent with patient: 20 minutes  Past Psychiatric History: Depression and substance use.ast Medical History:  Past Medical History  Diagnosis Date  . HIV (human immunodeficiency virus infection) [facility](HCC)     Past Surgical History  Procedure Laterality Date  . Lung biopsy     Family History: History reviewed. No pertinent family history. Family Psychiatric  History: See H&P.ial History:  History  Alcohol Use  . 3.6 oz/week  . 6 Cans of beer per week     History  Drug Use  . Yes  . Special: Marijuana, IV, Methamphetamines    Social History   Social History  . Marital Status: Single    Spouse Name: N/A  . Number of Children: N/A  . Years of Education: N/A   Social History Main  Topics  . Smoking status: Current Every Day Smoker  . Smokeless tobacco: None  . Alcohol Use: 3.6 oz/week    6 Cans of beer per week  . Drug Use: Yes    Special: Marijuana, IV, Methamphetamines  . Sexual Activity: Not Asked   Other Topics Concern  . None   Social History Narrative   Additional Social History:                         Sleep: Fair  Appetite:  Fair  Current Medications: Current Facility-Administered Medications  Medication Dose Route Frequency Provider Last Rate Last Dose  . acetaminophen (TYLENOL) tablet 650 mg  650 mg Oral Q6H PRN Audery AmelJohn T Clapacs, MD      . alum & mag hydroxide-simeth (MAALOX/MYLANTA) 200-200-20 MG/5ML suspension 30 mL  30 mL Oral Q4H PRN Audery AmelJohn T Clapacs, MD      . azithromycin (ZITHROMAX) 200 MG/5ML suspension 600 mg  600 mg Oral Weekly Mick Sellavid P Fitzgerald, MD   600 mg at 12/10/15 1705  . baclofen (LIORESAL) tablet 10 mg  10 mg Oral TID Shari ProwsJolanta B Hersey Maclellan, MD   10 mg at 12/11/15 0926  . carbamazepine (TEGRETOL) tablet 200 mg  200 mg Oral BID AC & HS Myya Meenach B Alanda Colton, MD   200 mg at 12/11/15 0926  . magnesium hydroxide (MILK OF MAGNESIA) suspension 30 mL  30 mL Oral Daily PRN Audery AmelJohn T Clapacs, MD      . nicotine (NICODERM CQ - dosed in mg/24 hours) patch  21 mg  21 mg Transdermal Daily Shari Prows, MD   21 mg at 12/11/15 0928  . QUEtiapine (SEROQUEL) tablet 200 mg  200 mg Oral QHS Jomarie Gellis B Kendyll Huettner, MD   200 mg at 12/10/15 2128  . QUEtiapine (SEROQUEL) tablet 25 mg  25 mg Oral TID Shari Prows, MD   25 mg at 12/11/15 0926  . sulfamethoxazole-trimethoprim (BACTRIM DS,SEPTRA DS) 800-160 MG per tablet 1 tablet  1 tablet Oral Daily Mick Sell, MD   1 tablet at 12/11/15 [phone]  . tuberculin injection 5 Units  5 Units Intradermal Once Shari Prows, MD        Lab Results:  Results for orders placed or performed during the hospital encounter of 12/08/15 (from the past 48 hour(s))  Chlamydia/NGC rt PCR ([facility]  only)     Status: None   Collection Time: 12/09/15 11:50 PM  Result Value Ref Range   Specimen source GC/Chlam PENIS    Chlamydia Tr NOT DETECTED NOT DETECTED   N gonorrhoeae NOT DETECTED NOT DETECTED    Comment: (NOTE) 100  This methodology has not been evaluated in pregnant women or in 200  patients with a history of hysterectomy. 300 400  This methodology will not be performed on patients less than 24  years of age.     Blood Alcohol level:  Lab Results  Component Value Date   ETH 83* 12/07/2015   ETH <5 11/06/2015    Physical Findings: AIMS:  , ,  ,  , Dental Status Current problems with teeth and/or dentures?: No Does patient usually wear dentures?: No  CIWA:    COWS:  COWS Total Score: 1  Musculoskeletal: Strength & Muscle Tone: within normal limits Gait & Station: normal Patient leans: N/A  Psychiatric Specialty Exam: Review of Systems  All other systems reviewed and are negative.   Blood pressure 128/82, pulse 77, temperature 98 F (36.7 C), temperature source Oral, resp. rate 18, height 5\' 11"  (1.803 m), weight 75.751 kg (167 lb), SpO2 99 %.Body mass index is 23.3 kg/(m^2).  General Appearance: Casual  Eye Contact::  Good  Speech:  Clear and Coherent  Volume:  Normal  Mood:  Anxious  Affect:  Appropriate  Thought Process:  Goal Directed  Orientation:  Full (Time, Place, and Person)  Thought Content:  WDL  Suicidal Thoughts:  No  Homicidal Thoughts:  No  Memory:  Immediate;   Fair Recent;   Fair Remote;   Fair  Judgement:  Impaired  Insight:  Shallow  Psychomotor Activity:  Normal  Concentration:  Fair  Recall:  [employer] of Knowledge:Fair  Language: Fair  Akathisia:  No  Handed:  Right  AIMS (if indicated):     Assets:  Communication Skills Desire for Improvement Physical Health Resilience Social Support  ADL's:  Intact  Cognition: WNL  Sleep:  Number of Hours: 5.45   Treatment Plan Summary: Daily contact with patient to assess and  evaluate symptoms and progress in treatment and Medication management   Mr. Kepner has a history of ADHD, mood instability, and substance abuse. He was admitted for suicidal ideation and hallucinations in the context of treatment noncompliance, substance use, and conflict in the community.  1. Suicidal ideation. The patient is able to contract for safety.  2. Mood. He responded well to Seroquel in the past. We increased Seroquel at night to 300 mg and Seroquel during the day to 50 mg. We started Tegretol for mood stabilization.  3. Substance abuse. The patient has not been sober since his teenage years. He was restarted on Baclofen for alcoholism and dose was increased to 40 mg tid. He was accepted by [facility], residential substance abuse treatment program.   4. HIV positive. ID input is greatly appreciated. The patient has not been taking medications since returning to [place] due to financial burden.  5. Smoking. We will offer nicotine patch.  6. Metabolic syndrome monitoring. Lipid profile, hemoglobin A1c, and TSH are normal. Prolactin 11.2.  7. Head injury. Head CT scan was performed in the emergency room and was unremarkable.   8. Disposition. Discharge to [facility] program in [facility] Saturday. PPD placed today.   Kristine Linea, MD 12/11/2015, 12:41 PM

## 2015-12-11 NOTE — Progress Notes (Signed)
[facility]  12/11/2015 2:08 PM Dustin Bennett  MRN:  [id number]  Subjective:  Dustin. Dustin Bennett has a history of mood instability and substance abuse. He was admitted for suicidal ideation and worsening of depression. He improved on a combination of Seroquel and Tegretol. [facility] and [facility]. His bed will be available on Saturday. He is also HIV positive and Dr. Sampson GoonFitzgerald kindly provided infectious disease consultation. The patient has legal charges but his probation officer will allo substance abuse treatment. I dictated discharge summary and did SRA. I do not see a note from Dr. Sampson GoonFitzgerald and do not know what medications for HIV will be ordered.   Today, Dustin Bennett denies any symptoms of depression, anxiety, or psychosis. He is not suicidal or homicidal. He is forward thinking and optimistic about the future. Exciting about his chest and participate in a good substance abuse treatment program. He tolerates medications well. Good group participation.  Principal Problem: Substance-induced psychotic disorder with hallucinations (HCC) Diagnosis:   Patient Active Problem List   Diagnosis Date Noted  . Tobacco use disorder [F17.200] 12/09/2015  . 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 Cannabis use disorder, severe, dependence (HCC) [F12.20] 07/25/2015  . Amphetamine abuse [F15.10] 07/25/2015  . Cocaine use disorder, severe, dependence (HCC) [F14.20] 07/25/2015  . HIV positive (HCC) [Z21] 07/25/2015   Total Time spent with patient: 20 minutes  Past Psychiatric History: Depression, mood instability, substances.  Past Medical History:  Past Medical History  Diagnosis Date  . HIV (human immunodeficiency virus infection) [facility](HCC)     Past Surgical History  Procedure Laterality Date  . Lung biopsy     Family History: History reviewed. No pertinent family history. Family  Psychiatric  History: See H&P. Social History:  History  Alcohol Use  . 3.6 oz/week  . 6 Cans of beer per week     History  Drug Use  . Yes  . Special: Marijuana, IV, Methamphetamines    Social History   Social History  . Marital Status: Single    Spouse Name: N/A  . Number of Children: N/A  . Years of Education: N/A   Social History Main Topics  . Smoking status: Current Every Day Smoker  . Smokeless tobacco: None  . Alcohol Use: 3.6 oz/week    6 Cans of beer per week  . Drug Use: Yes    Special: Marijuana, IV, Methamphetamines  . Sexual Activity: Not Asked   Other Topics Concern  . None   Social History Narrative   Additional Social History:                         Sleep: Fair  Appetite:  Fair  Current Medications: Current Facility-Administered Medications  Medication Dose Route Frequency Provider Last Rate Last Dose  . acetaminophen (TYLENOL) tablet 650 mg  650 mg Oral Q6H PRN Audery AmelJohn T Clapacs, MD      . alum & mag hydroxide-simeth (MAALOX/MYLANTA) 200-200-20 MG/5ML suspension 30 mL  30 mL Oral Q4H PRN Audery AmelJohn T Clapacs, MD      . azithromycin (ZITHROMAX) 200 MG/5ML suspension 600 mg  600 mg Oral Weekly Mick Sellavid P Fitzgerald, MD   600 mg at 12/10/15 1705  . baclofen (LIORESAL) tablet 40 mg  40 mg Oral TID Milaina Sher B Kelicia Youtz, MD      . carbamazepine (TEGRETOL) tablet 200 mg  200 mg Oral BID AC &  HS Shari Prows, MD   200 mg at 12/11/15 0926  . magnesium hydroxide (MILK OF MAGNESIA) suspension 30 mL  30 mL Oral Daily PRN Audery Amel, MD      . nicotine (NICODERM CQ - dosed in mg/24 hours) patch 21 mg  21 mg Transdermal Daily Khylie Larmore B Maille Halliwell, MD   21 mg at 12/11/15 0928  . QUEtiapine (SEROQUEL) tablet 300 mg  300 mg Oral QHS Chareese Sergent B Zeya Balles, MD      . QUEtiapine (SEROQUEL) tablet 50 mg  50 mg Oral TID Shari Prows, MD      . sulfamethoxazole-trimethoprim (BACTRIM DS,SEPTRA DS) 800-160 MG per tablet 1 tablet  1 tablet Oral Daily  Mick Sell, MD   1 tablet at 12/11/15 [phone]  . tuberculin injection 5 Units  5 Units Intradermal Once Shari Prows, MD        Lab Results:  Results for orders placed or performed during the hospital encounter of 12/08/15 (from the past 48 hour(s))  Chlamydia/NGC rt PCR ([facility] only)     Status: None   Collection Time: 12/09/15 11:50 PM  Result Value Ref Range   Specimen source GC/Chlam PENIS    Chlamydia Tr NOT DETECTED NOT DETECTED   N gonorrhoeae NOT DETECTED NOT DETECTED    Comment: (NOTE) 100  This methodology has not been evaluated in pregnant women or in 200  patients with a history of hysterectomy. 300 400  This methodology will not be performed on patients less than 29  years of age.     Blood Alcohol level:  Lab Results  Component Value Date   ETH 83* 12/07/2015   ETH <5 11/06/2015    Physical Findings: AIMS:  , ,  ,  , Dental Status Current problems with teeth and/or dentures?: No Does patient usually wear dentures?: No  CIWA:    COWS:  COWS Total Score: 1  Musculoskeletal: Strength & Muscle Tone: within normal limits Gait & Station: normal Patient leans: N/A  Psychiatric Specialty Exam: Review of Systems  Psychiatric/Behavioral: Positive for substance abuse.  All other systems reviewed and are negative.   Blood pressure 128/82, pulse 77, temperature 98 F (36.7 C), temperature source Oral, resp. rate 18, height 5\' 11"  (1.803 m), weight 75.751 kg (167 lb), SpO2 99 %.Body mass index is 23.3 kg/(m^2).  General Appearance: Casual  Eye Contact::  Good  Speech:  Clear and Coherent  Volume:  Normal  Mood:  Anxious  Affect:  Appropriate  Thought Process:  Goal Directed  Orientation:  Full (Time, Place, and Person)  Thought Content:  WDL  Suicidal Thoughts:  No  Homicidal Thoughts:  No  Memory:  Immediate;   Fair Recent;   Fair Remote;   Fair  Judgement:  Impaired  Insight:  Shallow  Psychomotor Activity:  Normal  Concentration:  Fair   Recall:  [employer] of Knowledge:Fair  Language: Fair  Akathisia:  No  Handed:  Right  AIMS (if indicated):     Assets:  Communication Skills Desire for Improvement Physical Health Resilience Social Support  ADL's:  Intact  Cognition: WNL  Sleep:  Number of Hours: 5.45   Treatment Plan Summary: Daily contact with patient to assess and evaluate symptoms and progress in treatment and Medication management   Dustin. Peretz has a history of ADHD, mood instability, and substance abuse. He was admitted for suicidal ideation and hallucinations in the context of treatment noncompliance, substance use, and conflict in the  community.  1. Suicidal ideation. This has resolved. The patient is able to contract for safety. He is forward thinking and optimistic about the future.  2. Mood. He responded well to Seroquel in the past. We increased Seroquel at night to 300 mg and Seroquel during the day to 50 mg. We started Tegretol for mood stabilization.   3. Substance abuse. The patient has not been sober since his teenage years. He was restarted on Baclofen for alcoholism and dose was increased to 40 mg tid. He was accepted by [facility], residential substance abuse treatment program.   4. HIV positive. ID input is greatly appreciated. The patient has not been taking medications since returning to [place] due to financial burden.  5. Smoking. We will offer nicotine patch.  6. Metabolic syndrome monitoring. Lipid profile, hemoglobin A1c, and TSH are normal. Prolactin 11.2.  7. Head injury. Head CT scan was performed in the emergency room and was unremarkable.   8. Disposition. Discharge to [facility] program in [facility] Saturday. PPD placed.   Kristine Linea, MD 12/11/2015, 2:08 PM

## 2015-12-12 MED ORDER — AZITHROMYCIN 200 MG/5ML PO SUSR: 600.0000 mg | ORAL | Status: DC

## 2015-12-12 MED ORDER — QUETIAPINE FUMARATE 300 MG PO TABS: 300.0000 mg | ORAL_TABLET | Freq: Every day | ORAL | Status: DC

## 2015-12-12 MED ORDER — QUETIAPINE FUMARATE 50 MG PO TABS: 50.0000 mg | ORAL_TABLET | Freq: Three times a day (TID) | ORAL | Status: DC

## 2015-12-12 MED ORDER — BACLOFEN 20 MG PO TABS: 40.0000 mg | ORAL_TABLET | Freq: Three times a day (TID) | ORAL | Status: DC

## 2015-12-12 MED ORDER — SULFAMETHOXAZOLE-TRIMETHOPRIM 800-160 MG PO TABS: 1.0000 | ORAL_TABLET | Freq: Every day | ORAL | Status: DC

## 2015-12-12 MED ORDER — CARBAMAZEPINE 200 MG PO TABS: 200.0000 mg | ORAL_TABLET | Freq: Two times a day (BID) | ORAL | Status: DC

## 2015-12-12 MED ORDER — CARBAMAZEPINE 200 MG PO TABS
200.0000 mg | ORAL_TABLET | Freq: Two times a day (BID) | ORAL | Status: DC
Start: 2015-12-12 — End: 2015-12-12

## 2015-12-12 NOTE — Progress Notes (Signed)
ID E Note Pts labs reviewed.  Laurette SchimkeJulie Willis in [facility] at [facility] has contacted his mother and will meet with him after discharge to file for [employer] white funding and ADAP. I can see in clinic to start ART Cont bactrim and azithro for now and at dc

## 2015-12-12 NOTE — B[facility] Group Notes (Signed)
[facility] LCSW Group Therapy   12/12/2015 9:30am  Type of Therapy: Group Therapy   Participation Level: Did Not Attend. Patient invited to participate but declined.    Dustin Bennett F. Kyara Boxer, MSW, LCSWA, LCAS   

## 2015-12-12 NOTE — [facility] Suicide Risk Assessment (Signed)
[facility]   Principal Problem: Substance-induced psychotic disorder with hallucinations [facility](HCC) Discharge Diagnoses:  Patient Active Problem List   Diagnosis Date Noted  . Tobacco use disorder [F17.200] 12/09/2015  . 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 Cannabis use disorder, severe, dependence (HCC) [F12.20] 07/25/2015  . Amphetamine abuse [F15.10] 07/25/2015  . Cocaine use disorder, severe, dependence (HCC) [F14.20] 07/25/2015  . HIV positive (HCC) [Z21] 07/25/2015    Total Time spent with patient: 30 minutes  Musculoskeletal: Strength & Muscle Tone: within normal limits Gait & Station: normal Patient leans: N/A  Psychiatric Specialty Exam: Review of Systems  Psychiatric/Behavioral: Positive for substance abuse.  All other systems reviewed and are negative.   Blood pressure 117/71, pulse 58, temperature 97.5 F (36.4 C), temperature source Oral, resp. rate 18, height 5\' 11"  (1.803 m), weight 75.751 kg (167 lb), SpO2 99 %.Body mass index is 23.3 kg/(m^2).  General Appearance: Casual  Eye Contact::  Good  Speech:  Clear and Coherent409  Volume:  Normal  Mood:  Anxious  Affect:  Appropriate  Thought Process:  Goal Directed  Orientation:  Full (Time, Place, and Person)  Thought Content:  WDL  Suicidal Thoughts:  No  Homicidal Thoughts:  No  Memory:  Immediate;   Fair Recent;   Fair Remote;   Fair  Judgement:  Impaired  Insight:  Shallow  Psychomotor Activity:  Normal  Concentration:  Fair  Recall:  FiservFair  Fund of Knowledge:Fair  Language: Fair  Akathisia:  No  Handed:  Right  AIMS (if indicated):     Assets:  Communication Skills Desire for Improvement Physical Health Resilience Social Support  Sleep:  Number of Hours: 6  Cognition: WNL  ADL's:  Intact   Mental Status Per Nursing Assessment::   On Admission:     Demographic Factors:  Male, Low socioeconomic status and  Unemployed  Loss Factors: Legal issues and Financial problems/change in socioeconomic status  Historical Factors: Family history of mental illness or substance abuse and Impulsivity  Risk Reduction Factors:   Sense of responsibility to family and Positive social support  Continued Clinical Symptoms:  Bipolar Disorder:   Depressive phase Alcohol/Substance Abuse/Dependencies  Cognitive Features That Contribute To Risk:  None    Suicide Risk:  Minimal: No identifiable suicidal ideation.  Patients presenting with no risk factors but with morbid ruminations; may be classified as minimal risk based on the severity of the depressive symptoms    Plan Of Care/Follow-up recommendations:  Activity:  As tolerated. Diet:  Regular. Other:  Keep follow-up appointments.  Kristine LineaJolanta Sharlee Rufino, MD 12/12/2015, 10:52 AM

## 2015-12-12 NOTE — B[facility] Group Notes (Signed)
[facility] Group Notes:  (Nursing/MHT/Case Management/Adjunct)  Date:  12/12/2015  Time:  4:05 PM  Type of Therapy:  Psychoeducational Skills  Participation Level:  Active  Participation Quality:  Appropriate  Affect:  Anxious  Cognitive:  Appropriate  Insight:  Appropriate  Engagement in Group:  Engaged  Modes of Intervention:  Discussion and Education  Summary of Progress/Problems:  Mickey Farberamela M Moses Ellison 12/12/2015, 4:05 PM
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
## 2015-12-12 NOTE — Progress Notes (Signed)
D: Observed pt in milieu socializing with peers. Patient alert and oriented x4. Patient denies SI/HI/AVH. Pt affect is euphoric, joking and laughing with peers and staff, and appropriate. Pt briefly discussed arguing with peer earlier, and indicated in the future he won't instigate things. Pt rated depression 0/10 and anxiety 5/10. Pt briefly discussed mood improving over his length of stay stay due to medications adjustments.  A: Offered active listening and support. Provided therapeutic communication. Administered scheduled medications. Discussed using better self control. R: Pt pleasant and cooperative. Pt medication compliant. Will continue Q15 min. checks. Safety maintained.

## 2015-12-12 NOTE — Progress Notes (Signed)
Recreation Therapy Notes  Date: 05.19.17 Time: 9:30 am Location: Craft Room  Group Topic: Coping Skills  Goal Area(s) Addresses:  Patient will participate in healthy coping skill. Patient will verbalize benefit of using art as a coping skill.  Behavioral Response: Attentive, Interactive  Intervention: Coloring  Activity: Patients colored coloring sheets while thinking about what emotions they felt and why art is a good coping skill.  Education: LRT educated group on healthy coping skills.  Education Outcome: Acknowledges education/In group clarification offered  Clinical Observations/Feedback: Patient colored coloring sheet. Patient contributed to group discussion by stating what makes art a good coping skill.  Jacquelynn CreeGreene,Quincie Haroon M, LRT/CTRS 12/12/2015 10:28 AM

## 2015-12-12 NOTE — [facility] Suicide Risk Assessment (Signed)
[facility] INPATIENT:  Family/Significant Other Suicide Prevention Education  Suicide Prevention Education:  Patient Refusal for Family/Significant Other Suicide Prevention Education: The patient Beatrix ShipperJeremy J Pellegrini has refused to provide written consent for family/significant other to be provided Family/Significant Other Suicide Prevention Education during admission and/or prior to discharge.  Physician notified.  CSW completed SPE with the pt.  Dorothe PeaJonathan F Mayer Vondrak 12/12/2015, 3:41 PM

## 2015-12-12 NOTE — Progress Notes (Signed)
Patient is pleasant & cooperative.Denies suicidal or homicidal ideations.Appropriate with staff & peers.Compliant with medications.Attended groups.Patient is discharged for tomorrow.

## 2015-12-12 NOTE — Plan of Care (Signed)
Problem: Coping: Goal: Ability to demonstrate self-control will improve Outcome: Progressing Pt exhibited good self-control during pm shift. Pt discussed not instigating situations with other pt's in the future.

## 2015-12-13 NOTE — Progress Notes (Signed)
Pt discharged  From behavior med in apparently satisfactory condition. All instructions reviewed with pt. Pt stated he understood and would comply. All belongings returned to pt. Pt denies SI and A/V hallucinations. Pt given 7-day supply of medications along with medication information

## 2015-12-13 NOTE — Plan of Care (Signed)
Problem: Coping: Goal: Ability to verbalize frustrations and anger appropriately will improve Outcome: Not Progressing Pt got innapropriately angry with [facility] for not allowing pt to have extra snack tray. Pt lingered on this for a time.

## 2015-12-13 NOTE — Progress Notes (Signed)
  [facility] :  Will you be returning to the same living situation after discharge:  No. At discharge, do you have transportation home?: Yes,  mother Do you have the ability to pay for your medications: [facility]  Release of information consent forms completed and in the chart;  Patient's signature needed at discharge.  Patient to Follow up at: Follow-up Information    Go to [facility] .   Why:  Please arrive to be admitted for long-term residential substance abuse treatment, medication management and therapy and call Marchelle GearingKathleen Simpson at ph: [phone] when you are on your way, in order to be admitted    Contact information:   [address] [place], [place] [address] Phone: [phone] Fax: [phone]      Next level of care provider has [facility] Link:no  Safety Planning and Suicide Prevention discussed: Yes,  with patient  Have you used any form of tobacco in the last 30 days? (Cigarettes, Smokeless Tobacco, Cigars, and/or Pipes): Yes  Has patient been referred to the Quitline?: Patient refused referral  Patient has been referred for addiction treatment: Yes  Christian Treadway L Kallen Mccrystal MSW, LCSWA  12/13/2015, 10:08 AM

## 2015-12-13 NOTE — Progress Notes (Signed)
D: Observed pt in day room interacting with peers. Patient alert and oriented x4. Patient denies SI/HI/AVH. Pt affect is anxious and angry, later in the evening. Pt stated his mood is "regular." Pt briefly discussed positive outlook about going to rehab. Later in the evening, pt got angry and upset with [facility] for not giving him an extra lunch tray. Pt lingered and got loud about this for a time.  A: Offered active listening and support. Provided therapeutic communication. Administered scheduled medications. Attempted to discuss lunch tray concerns with pt R: Pt cooperative. Pt eventually went to sleep without incident. Pt medication compliant. Will continue Q15 min. checks. Safety maintained.

## 2015-12-17 LAB — HLA B*5701: HLA B 5701: NEGATIVE

## 2015-12-24 LAB — HIV-1 RNA ULTRAQUANT REFLEX TO GENTYP+
HIV-1 RNA BY PCR: 23600 copies/mL
HIV-1 RNA Quant, Log: 4.373 log10copy/mL

## 2015-12-24 LAB — HEPATITIS C ANTIBODY: HCV Ab: <0.1 s/co ratio (ref 0.0–0.9)

## 2015-12-24 LAB — REFLEX TO GENOSURE(R) MG: HIV GENOSURE(R) MG PDF: 0

## 2015-12-24 LAB — RPR: RPR: NONREACTIVE

## 2015-12-24 LAB — CRYPTOCOCCUS ANTIGEN, SERUM: CRYPTOCOCCUS ANTIGEN, SERUM: NEGATIVE

## 2015-12-30 ENCOUNTER — Ambulatory Visit: Payer: Self-pay

## 2016-02-19 ENCOUNTER — Ambulatory Visit: Payer: Self-pay

## 2016-02-23 ENCOUNTER — Encounter: Payer: Self-pay | Admitting: Emergency Medicine

## 2016-02-23 ENCOUNTER — Emergency Department
Admission: EM | Admit: 2016-02-23 | Discharge: 2016-02-23 | Disposition: A | Discharge status: Home / Self Care | Payer: Self-pay | Attending: Emergency Medicine | Admitting: Emergency Medicine

## 2016-02-23 ENCOUNTER — Ambulatory Visit: Payer: No Typology Code available for payment source

## 2016-02-23 DIAGNOSIS — F17200 Nicotine dependence, unspecified, uncomplicated: Secondary | ICD-10-CM | POA: Insufficient documentation

## 2016-02-23 DIAGNOSIS — R21 Rash and other nonspecific skin eruption: Secondary | ICD-10-CM | POA: Insufficient documentation

## 2016-02-23 DIAGNOSIS — Z21 Asymptomatic human immunodeficiency virus [HIV] infection status: Secondary | ICD-10-CM | POA: Insufficient documentation

## 2016-02-23 LAB — CBC WITH DIFFERENTIAL/PLATELET
Basophils Absolute: 0 10*3/uL (ref 0–0.1)
Basophils Relative: 1 %
Eosinophils Absolute: 0.1 10*3/uL (ref 0–0.7)
Eosinophils Relative: 4 %
HCT: 43.6 % (ref 40.0–52.0)
Hemoglobin: 15.2 g/dL (ref 13.0–18.0)
Lymphocytes Relative: 21 %
Lymphs Abs: 0.6 10*3/uL — ABNORMAL LOW (ref 1.0–3.6)
MCH: 31.7 pg (ref 26.0–34.0)
MCHC: 35 g/dL (ref 32.0–36.0)
MCV: 90.7 fL (ref 80.0–100.0)
Monocytes Absolute: 0.4 10*3/uL (ref 0.2–1.0)
Monocytes Relative: 12 %
Neutro Abs: 2 10*3/uL (ref 1.4–6.5)
Neutrophils Relative %: 62 %
Platelets: 139 10*3/uL — ABNORMAL LOW (ref 150–440)
RBC: 4.8 MIL/uL (ref 4.40–5.90)
RDW: 14.6 % — ABNORMAL HIGH (ref 11.5–14.5)
WBC: 3.1 10*3/uL — ABNORMAL LOW (ref 3.8–10.6)

## 2016-02-23 LAB — SEDIMENTATION RATE: Sed Rate: 10 mm/hr (ref 0–15)

## 2016-02-23 LAB — COMPREHENSIVE METABOLIC PANEL
ALT: 17 U/L (ref 17–63)
AST: 32 U/L (ref 15–41)
Albumin: 4.3 g/dL (ref 3.5–5.0)
Alkaline Phosphatase: 52 U/L (ref 38–126)
Anion gap: 8 (ref 5–15)
BUN: 11 mg/dL (ref 6–20)
CO2: 25 mmol/L (ref 22–32)
Calcium: 9.4 mg/dL (ref 8.9–10.3)
Chloride: 103 mmol/L (ref 101–111)
Creatinine, Ser: 1.32 mg/dL — ABNORMAL HIGH (ref 0.61–1.24)
GFR calc Af Amer: >60 mL/min (ref >60)
GFR calc non Af Amer: >60 mL/min (ref >60)
Glucose, Bld: 98 mg/dL (ref 65–99)
Potassium: 3.4 mmol/L — ABNORMAL LOW (ref 3.5–5.1)
Sodium: 136 mmol/L (ref 135–145)
Total Bilirubin: 0.3 mg/dL (ref 0.3–1.2)
Total Protein: 8.7 g/dL — ABNORMAL HIGH (ref 6.5–8.1)

## 2016-02-23 MED ORDER — KETOCONAZOLE 2 % EX CREA: 1.0000 "application " | TOPICAL_CREAM | Freq: Every day | CUTANEOUS | 0 refills | Status: DC

## 2016-02-23 NOTE — ED Triage Notes (Signed)
Pt presents to ED with rash to lower legs and feet bilaterally for over one week. Rash noted as diffuse singular bumps. Pt reports extreme itching and the lesions blister. Pt denies being out in the woods. Pt states his mother has a dog.

## 2016-02-23 NOTE — ED Provider Notes (Signed)
[facility] Emergency Department Provider Note  ____________________________________________  Time seen: Approximately 1:09 PM  I have reviewed the triage vital signs and the nursing notes.   HISTORY  Chief Complaint Rash   HPI Dustin Bennett is a 30 y.o. male, personal history of HIV for 14 years, presents to the emergency room with a rash for approximately 1 month. The rash started on his right foot/ankle area and has spread most prominently to both feet and lower extremities, but lesions are also present on his upper arms and legs. He describes the rash as itchy with some areas of blistering since the initial presentation. He also reports that for the last 4-5 days his scrotum has also been very itchy, but he hasn't noticed any lesions. In regards to scrotal itch, denies recent sexual contact, jock strap usage, or recent shaving. Palliative measures include calamine lotion, benadryl, and anti-itch sprays to include poison ivy, poison oak, and insect bites; none of which have provided relief. Denies chest pain, shortness of breath, abdominal pain, headache, or fever. He was prescribed a new HIV medication, Triumeq, about a month ago and was concerned that the rash may be related to medication. He reported that his CD4 count was below 100 when it was last checked a month ago. No known exposures.   Past Medical History:  Diagnosis Date  . HIV (human immunodeficiency virus infection) [facility])     Patient Active Problem List   Diagnosis Date Noted  . Tobacco use disorder 12/09/2015  . 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 Cannabis use disorder, severe, dependence (HCC) 07/25/2015  . 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 Cocaine use disorder, severe, dependence (HCC) 07/25/2015  . HIV positive (HCC) 07/25/2015    Past Surgical History:  Procedure Laterality Date  . LUNG BIOPSY      Prior to Admission medications   Medication Sig  Start Date End Date Taking? Authorizing Provider  azithromycin (ZITHROMAX) 200 MG/5ML suspension Take 15 mLs (600 mg total) by mouth once a week. 12/12/15   Shari Prows, MD  baclofen (LIORESAL) 20 MG tablet Take 2 tablets (40 mg total) by mouth 3 (three) times daily. 12/12/15   Shari Prows, MD  carbamazepine (TEGRETOL) 200 MG tablet Take 1 tablet (200 mg total) by mouth 2 (two) times daily at 8 am and 10 pm. 12/12/15   Jolanta B Pucilowska, MD  ketoconazole (NIZORAL) 2 % cream Apply 1 application topically daily. 02/23/16   Chinita Pester, FNP  QUEtiapine (SEROQUEL) 300 MG tablet Take 1 tablet (300 mg total) by mouth at bedtime. 12/12/15   Shari Prows, MD  QUEtiapine (SEROQUEL) 50 MG tablet Take 1 tablet (50 mg total) by mouth 3 (three) times daily. 12/12/15   Shari Prows, MD  sulfamethoxazole-trimethoprim (BACTRIM DS,SEPTRA DS) 800-160 MG tablet Take 1 tablet by mouth daily. 12/12/15   Shari Prows, MD    Allergies Review of patient's allergies indicates no known allergies.  No family history on file.  Social History Social History  Substance Use Topics  . Smoking status: Current Every Day Smoker  . Smokeless tobacco: Not on file  . Alcohol use 3.6 oz/week    6 Cans of beer per week    Review of Systems  Constitutional: negative for fever/chills Respiratory: negative for shortness of breath.  Musculoskeletal: Negative for pain. Skin: Itchy rash on legs and arms. Noted lumps under the chin, right anterior neck, and anterior to right ear.  Neurological: Negative for headaches, focal weakness or numbness. ____________________________________________   PHYSICAL EXAM:  VITAL SIGNS: ED Triage Vitals [02/23/16 1233]  Enc Vitals Group     BP (!) 159/73     Pulse Rate 99     Resp 18     Temp 98.4 F (36.9 C)     Temp Source Oral     SpO2 97 %     Weight 175 lb (79.4 kg)     Height 5\' 11"  (1.803 m)     Head Circumference      Peak Flow       Pain Score      Pain Loc      Pain Edu?      Excl. in GC?      Constitutional: Alert and oriented. Well appearing and in no acute distress. Eyes: Conjunctivae are normal. EOMI. Nose: No congestion/rhinnorhea. Mouth/Throat: Mucous membranes are moist.   Neck: No stridor. Lymphatic: Positive lymph node inflammation on the underside of chin, right anterior neck, and anteriorly to right ear. Submandibular lymph node approximately 1.5 cm and tender to palpation.  Cardiovascular: Good peripheral circulation. Respiratory: Normal respiratory effort.  No retractions. Musculoskeletal: FROM throughout. Neurologic:  Normal speech and language. No gross focal neurologic deficits are appreciated. Skin:  Maculopapular rash with areas of white crusting. Rash is most prominent bilaterally on the dorsal suface of feet and below the knee.  ____________________________________________   LABS (all labs ordered are listed, but only abnormal results are displayed)  Labs Reviewed  CBC WITH DIFFERENTIAL/PLATELET - Abnormal; Notable for the following:       Result Value   WBC 3.1 (*)    RDW 14.6 (*)    Platelets 139 (*)    Lymphs Abs 0.6 (*)    All other components within normal limits  COMPREHENSIVE METABOLIC PANEL - Abnormal; Notable for the following:    Potassium 3.4 (*)    Creatinine, Ser 1.32 (*)    Total Protein 8.7 (*)    All other components within normal limits  SEDIMENTATION RATE   ____________________________________________  EKG   ____________________________________________  RADIOLOGY   ____________________________________________   PROCEDURES  Procedure(s) performed: None ____________________________________________   INITIAL IMPRESSION / ASSESSMENT AND PLAN / ED COURSE  Pertinent labs & imaging results that were available during my care of the patient were reviewed by me and considered in my medical decision making (see chart for details).  Rash likely fungal as  he is on Bactrim daily and azithromycin weekly. Will treat with ketoconazole and have him follow up with his PCP or dermatology if not improving over the next 2 weeks.  He was also advised to return to the emergency department for symptoms that change or worsen if unable to schedule an appointment.  ____________________________________________   FINAL CLINICAL IMPRESSION(S) / ED DIAGNOSES  Final diagnoses:  Rash and nonspecific skin eruption    New Prescriptions   KETOCONAZOLE (NIZORAL) 2 % CREAM    Apply 1 application topically daily.    Note:  This document was prepared using Dragon voice recognition software and may include unintentional dictation errors.    Chinita Pester, FNP 02/23/16 1517    Minna Antis, MD 02/23/16 [phone]

## 2016-04-04 ENCOUNTER — Encounter: Payer: Self-pay | Admitting: Emergency Medicine

## 2016-04-04 ENCOUNTER — Emergency Department
Admission: EM | Admit: 2016-04-04 | Discharge: 2016-04-04 | Disposition: A | Discharge status: Home / Self Care | Payer: Self-pay | Attending: Emergency Medicine | Admitting: Emergency Medicine

## 2016-04-04 DIAGNOSIS — F29 Unspecified psychosis not due to a substance or known physiological condition: Secondary | ICD-10-CM

## 2016-04-04 DIAGNOSIS — Z21 Asymptomatic human immunodeficiency virus [HIV] infection status: Secondary | ICD-10-CM | POA: Insufficient documentation

## 2016-04-04 DIAGNOSIS — F19151 Other psychoactive substance abuse with psychoactive substance-induced psychotic disorder with hallucinations: Secondary | ICD-10-CM | POA: Insufficient documentation

## 2016-04-04 HISTORY — DX: Major depressive disorder, single episode, unspecified: F32.9

## 2016-04-04 HISTORY — DX: Bipolar disorder, unspecified: F31.9

## 2016-04-04 HISTORY — DX: Depression, unspecified: F32.A

## 2016-04-04 LAB — COMPREHENSIVE METABOLIC PANEL
ALK PHOS: 44 U/L (ref 38–126)
ALT: 17 U/L (ref 17–63)
AST: 27 U/L (ref 15–41)
Albumin: 4.8 g/dL (ref 3.5–5.0)
Anion gap: 7 (ref 5–15)
BILIRUBIN TOTAL: 0.7 mg/dL (ref 0.3–1.2)
BUN: 12 mg/dL (ref 6–20)
CALCIUM: 9.7 mg/dL (ref 8.9–10.3)
CO2: 24 mmol/L (ref 22–32)
CREATININE: 1.38 mg/dL — AB (ref 0.61–1.24)
Chloride: 103 mmol/L (ref 101–111)
Glucose, Bld: 86 mg/dL (ref 65–99)
Potassium: 3.3 mmol/L — ABNORMAL LOW (ref 3.5–5.1)
Sodium: 134 mmol/L — ABNORMAL LOW (ref 135–145)
Total Protein: 9 g/dL — ABNORMAL HIGH (ref 6.5–8.1)

## 2016-04-04 LAB — CBC
HCT: 44.7 % (ref 40.0–52.0)
HEMOGLOBIN: 15.6 g/dL (ref 13.0–18.0)
MCH: 32.4 pg (ref 26.0–34.0)
MCHC: 34.9 g/dL (ref 32.0–36.0)
MCV: 93 fL (ref 80.0–100.0)
Platelets: 148 10*3/uL — ABNORMAL LOW (ref 150–440)
RBC: 4.81 MIL/uL (ref 4.40–5.90)
RDW: 14.9 % — ABNORMAL HIGH (ref 11.5–14.5)
WBC: 4.2 10*3/uL (ref 3.8–10.6)

## 2016-04-04 LAB — SALICYLATE LEVEL

## 2016-04-04 LAB — ACETAMINOPHEN LEVEL: Acetaminophen (Tylenol), Serum: <10 ug/mL — ABNORMAL LOW (ref 10–30)

## 2016-04-04 LAB — ETHANOL

## 2016-04-04 MED ORDER — HALOPERIDOL LACTATE 5 MG/ML IJ SOLN
INTRAMUSCULAR | Status: AC
  Filled 2016-04-04: qty 1

## 2016-04-04 MED ORDER — DIPHENHYDRAMINE HCL 50 MG/ML IJ SOLN
50.0000 mg | Freq: Once | INTRAMUSCULAR | Status: DC
Start: 2016-04-04 — End: 2016-04-04

## 2016-04-04 MED ORDER — LORAZEPAM 2 MG PO TABS
2.0000 mg | ORAL_TABLET | Freq: Once | ORAL | Status: AC
  Administered 2016-04-04: 2 mg via ORAL
  Filled 2016-04-04: qty 1

## 2016-04-04 MED ORDER — DIPHENHYDRAMINE HCL 50 MG/ML IJ SOLN
INTRAMUSCULAR | Status: AC
  Filled 2016-04-04: qty 1

## 2016-04-04 MED ORDER — HALOPERIDOL LACTATE 5 MG/ML IJ SOLN: 5.0000 mg | Freq: Once | INTRAMUSCULAR | Status: DC

## 2016-04-04 NOTE — ED Notes (Signed)
Nurse gives pt the phone so that he can call his mother.  Pt requesting to speak to this nurse. States that I would like to leave now I am going to sign myself out. Pt becomes agitated stating "Francesca OmanYall are going to have to sedated me or do something because I am going to show out." Pt explained that he is under commitment and can not leave. Pt becomes more agitated.  Officer and Derrill KayGoodman, MD. Officers have to restrain pt for a few minutes. Pt agrees to calm down if we do not give medication. Medication held at this time.

## 2016-04-04 NOTE — ED Triage Notes (Signed)
Pt presents to ED with [employer] under IVC, per [employer] officer pt has IVC paperwork en route at this time. Per [employer] officer they were called out to patient's residence for breaking and entering, upon their arrival pt became very paranoid, stating the voices had told him something was wrong with his mom. Per [employer] pt then began pulling up clumps of grass looking for evidence of his mother's homicide. Pt then pulled off the siding of his house and was asking officers to check under the house because "something was wrong". [employer] states then that the patient went to his neighbors wood pile and began looking through the woodpile because the voices told him too. Pt states he is here to talk to someone, is calm and cooperative with staff at this time. Pt denies hearing voices with this RN, pt denies SI/HI at this time.

## 2016-04-04 NOTE — ED Notes (Signed)
Pt talked with social work and states he would like to take an oral medication for anxiety.
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
## 2016-04-04 NOTE — ED Notes (Signed)
Per [employer] officer pt was brought in from his home he was digging in his yard and concerned that his mothers bones were buried in his yard. The neighbors called police due to him telling them that he was looking for the bones of his mother. The neighbors also called his mom who is supposedly in [place] and left a message on her answering service for her to call back to the police station. Pt. told officer that voices were telling him to dig for her bones. Crisis center was called out to residence and took out commitment papers on the patient and suggested that he be brought to the hospital for evaluation. Pt told officer that he used drugs of unknown substance about 48 hours ago. Pt also surrendered a syringe to police. Pt calm and cooperative at this time.

## 2016-04-04 NOTE — ED Notes (Signed)
Pt given dinner tray.
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
## 2016-04-04 NOTE — ED Notes (Signed)
Nurse has now spoke with patient now 3 times within the last 30 minutes about the plan for him to speak with psychiatry, IVC paperwork, remaining in his room  And remaining calm. Pt talking in a calm voice but continues to be agitated. Pt standing in door way.

## 2016-04-04 NOTE — ED Provider Notes (Signed)
-----------------------------------------   7:16 PM on 04/04/2016 -----------------------------------------  Patient has been seen by a specialist on call who is recommending reversal of the IVC, they have rescinded the IVC and believe the patient is safe for discharge home. Patient appears well in the emergency department. Initially was agitated, but was easily redirected and has not been agitated since. Family is here and feels safe taking the patient home. We'll discharge the patient at this time.   Minna AntisKevin Albirda Shiel, MD 04/04/16 [id number]

## 2016-04-04 NOTE — ED Notes (Signed)
Soc telepsych consulting with pt at this time.

## 2016-04-04 NOTE — ED Notes (Addendum)
Pt taking with social work and TTS at bedside at this time.

## 2016-04-04 NOTE — ED Notes (Signed)
Pt given dinner tray. Calm and cooperative at this time

## 2016-04-04 NOTE — ED Notes (Signed)
Pt given lunch tray. Pt sitting on bed reading news paper.

## 2016-04-04 NOTE — ED Notes (Signed)
Reviewed with patient personal belongings given to this RN by [employer]. 1 house key, 1 quarter, 1 nickel, 2 lighters, 1 pack of matches, 1 credit card, and 1 iphone placed in bag and verified with patient.

## 2016-04-04 NOTE — Progress Notes (Signed)
LCSW engaged with patient. LCSW just asked him, what was upsetting him he reported  He wishes to be at home and was wrongfully picked up by police. He was digging in his front yard and feels we were wrong to stop him and bring him here. LCSW reviewed positive behaviors equal positive outcomes. Patient requested a phone to speak to his sister.LCSW continued to listen and support patient. TTS arrived to do assessment   Pastor Sgro LCSW [phone]

## 2016-04-04 NOTE — ED Notes (Signed)
SOC recommended that pt to be discharged. Waiting on IVC papers to bed resended. Pt informed and agreeable. Pt called his girlfriend and she is now in the lobby to pick him up she was informed that pt will be discharged but it may take some time.

## 2016-04-04 NOTE — ED Notes (Signed)
IVC paperwork arrived to facility via [employer].

## 2016-04-21 ENCOUNTER — Emergency Department
Admission: EM | Admit: 2016-04-21 | Discharge: 2016-04-21 | Disposition: A | Discharge status: Home / Self Care | Payer: Self-pay | Attending: Emergency Medicine | Admitting: Emergency Medicine

## 2016-04-21 DIAGNOSIS — L259 Unspecified contact dermatitis, unspecified cause: Secondary | ICD-10-CM

## 2016-04-21 DIAGNOSIS — Z79899 Other long term (current) drug therapy: Secondary | ICD-10-CM | POA: Insufficient documentation

## 2016-04-21 DIAGNOSIS — F1290 Cannabis use, unspecified, uncomplicated: Secondary | ICD-10-CM | POA: Insufficient documentation

## 2016-04-21 DIAGNOSIS — Z21 Asymptomatic human immunodeficiency virus [HIV] infection status: Secondary | ICD-10-CM | POA: Insufficient documentation

## 2016-04-21 DIAGNOSIS — L243 Irritant contact dermatitis due to cosmetics: Secondary | ICD-10-CM | POA: Insufficient documentation

## 2016-04-21 DIAGNOSIS — F17210 Nicotine dependence, cigarettes, uncomplicated: Secondary | ICD-10-CM | POA: Insufficient documentation

## 2016-04-21 MED ORDER — MUPIROCIN CALCIUM 2 % EX CREA: 1.0000 "application " | TOPICAL_CREAM | Freq: Three times a day (TID) | CUTANEOUS | 0 refills | Status: DC

## 2016-04-21 NOTE — ED Triage Notes (Signed)
Pt presents to ED for a burning sensation on face, states he tried new face wash and the next day face was burning. Denies itching. Talking in complete sentences, alert and oriented x4. Pt on personal computer during assessment. Drinking a soda.

## 2016-04-21 NOTE — ED Provider Notes (Signed)
[facility] Emergency Department Provider Note ____________________________________________  Time seen: 1634  I have reviewed the triage vital signs and the nursing notes.  HISTORY  Chief Complaint  Rash  HPI Dustin Bennett is a 30 y.o. male presents to the ED for evaluation of irritation to the scan after using an over-the-counter facial wash product. The patient describes a burning sensation and now notes scabbing to the skin just between his nose and his upper lip after he used a topical cleaning solution that he believes had a high alcohol content. He denies any burning, irritation, or rash formation at the time of use. His symptoms did not begin until 2 days later when he began to notice some superficial scabbing and peeling. He presents now for evaluation of a presumed reaction to a local cleanser. He denies any difficulty breathing, swallowing, or any other irritation around the face. He does note that he had recently shaved his upper lip prior to the use of this product.  Past Medical History:  Diagnosis Date  . Bipolar 1 disorder (HCC)   . Depression   . HIV (human immunodeficiency virus infection) [facility](HCC)     Patient Active Problem List   Diagnosis Date Noted  . Tobacco use disorder 12/09/2015  . 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 Cannabis use disorder, severe, dependence (HCC) 07/25/2015  . 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 Cocaine use disorder, severe, dependence (HCC) 07/25/2015  . HIV positive (HCC) 07/25/2015    Past Surgical History:  Procedure Laterality Date  . LUNG BIOPSY      Prior to Admission medications   Medication Sig Start Date End Date Taking? Authorizing Provider  azithromycin (ZITHROMAX) 200 MG/5ML suspension Take 15 mLs (600 mg total) by mouth once a week. 12/12/15   Shari ProwsJolanta B Pucilowska, MD  baclofen (LIORESAL) 20 MG tablet Take 2 tablets (40 mg total) by mouth 3 (three) times daily.  12/12/15   Shari ProwsJolanta B Pucilowska, MD  carbamazepine (TEGRETOL) 200 MG tablet Take 1 tablet (200 mg total) by mouth 2 (two) times daily at 8 am and 10 pm. 12/12/15   Jolanta B Pucilowska, MD  ketoconazole (NIZORAL) 2 % cream Apply 1 application topically daily. 02/23/16   Chinita Pesterari B Triplett, FNP  mupirocin cream (BACTROBAN) 2 % Apply 1 application topically 3 (three) times daily. 04/21/16   Semaj Kham V Bacon Nimo Verastegui, PA-C  QUEtiapine (SEROQUEL) 300 MG tablet Take 1 tablet (300 mg total) by mouth at bedtime. 12/12/15   Shari ProwsJolanta B Pucilowska, MD  QUEtiapine (SEROQUEL) 50 MG tablet Take 1 tablet (50 mg total) by mouth 3 (three) times daily. 12/12/15   Shari ProwsJolanta B Pucilowska, MD  sulfamethoxazole-trimethoprim (BACTRIM DS,SEPTRA DS) 800-160 MG tablet Take 1 tablet by mouth daily. 12/12/15   Shari ProwsJolanta B Pucilowska, MD   Allergies Review of patient's allergies indicates no known allergies.  History reviewed. No pertinent family history.  Social History Social History  Substance Use Topics  . Smoking status: Current Every Day Smoker    Packs/day: 1.00    Types: Cigarettes  . Smokeless tobacco: Never Used  . Alcohol use No    Review of Systems  Constitutional: Negative for fever, Chills, or sweats. Eyes: Negative for visual changes. ENT: Negative for sore throat. Cardiovascular: Negative for chest pain. Respiratory: Negative for shortness of breath. Gastrointestinal: Negative for abdominal pain, vomiting and diarrhea. Skin: Positive for rash. ____________________________________________  PHYSICAL EXAM:  VITAL SIGNS: ED Triage Vitals  Enc Vitals Group  BP 04/21/16 1605 (!) 112/59     Pulse Rate 04/21/16 1605 95     Resp 04/21/16 1605 18     Temp 04/21/16 1605 97.9 F (36.6 C)     Temp Source 04/21/16 1605 Oral     SpO2 04/21/16 1605 100 %     Weight 04/21/16 1608 180 lb (81.6 kg)     Height 04/21/16 1608 5\' 11"  (1.803 m)     Head Circumference --      Peak Flow --      Pain Score 04/21/16 1608  6     Pain Loc --      Pain Edu? --      Excl. in GC? --    Constitutional: Alert and oriented. Well appearing and in no distress. Head: Normocephalic and atraumatic. Respiratory: Normal respiratory effort. Neurologic:  Normal speech and language. No gross focal neurologic deficits are appreciated. Skin:  Skin is warm, dry and intact, except for the skin between the Philtrum and the upper lip. He is noted to have superficial scabbing and peeling of the scan on the edge of the nares bilaterally. There is no erythema, edema, or induration. There is no purulence, honey colored bruise or crusting noted. ____________________________________________  INITIAL IMPRESSION / ASSESSMENT AND PLAN / ED COURSE  Patient with what appears to be in acute irritate contact dermatitis resulting in a superficial chemical burn to the nares and philtrum. He is discharged with a prescription for mupirocin ointment to apply 3 times a day as needed. He will also keep the area clean, dry, and moisturizing necessary. He will follow up with his primary care provider for further evaluation as necessary. Work note is provided as requested confirmed the patient was seen in the ED today.  Clinical Course   ____________________________________________  FINAL CLINICAL IMPRESSION(S) / ED DIAGNOSES  Final diagnoses:  Contact dermatitis  Irritant contact dermatitis due to cosmetics      Lissa Hoard, PA-C 04/21/16 1927    Minna Antis, MD 04/21/16 2251

## 2016-04-21 NOTE — Discharge Instructions (Signed)
You appear to have a minor skin burn from a topical cleanser. Apply the antibiotic ointment 3-times daily. Keep the area clean and moisturized. Follow-up with [facility] as needed.

## 2016-08-13 ENCOUNTER — Telehealth: Payer: Self-pay | Admitting: Pharmacy Technician

## 2016-08-13 NOTE — Telephone Encounter (Signed)
Patient called to inquire about obtaining medication assistance from [facility] for Seroquel.  Patient approved to receive medications from [facility].  Patient to contact Julie-Social Worker and have her call me to discuss if [facility] will be able to provide medication assistance for the Seroquel.  Sherilyn DacostaBetty J. Kluttz Care Manager [facility]

## 2016-10-15 IMAGING — DX DG WRIST COMPLETE 3+V*R*
4 series · 4 of 4 positions shown · non-contrast
Comparison: None.

CLINICAL DATA: Right wrist pain.  No injury.

EXAM:
RIGHT WRIST - COMPLETE 3+ VIEW

[wrist ap (1 of 2)]
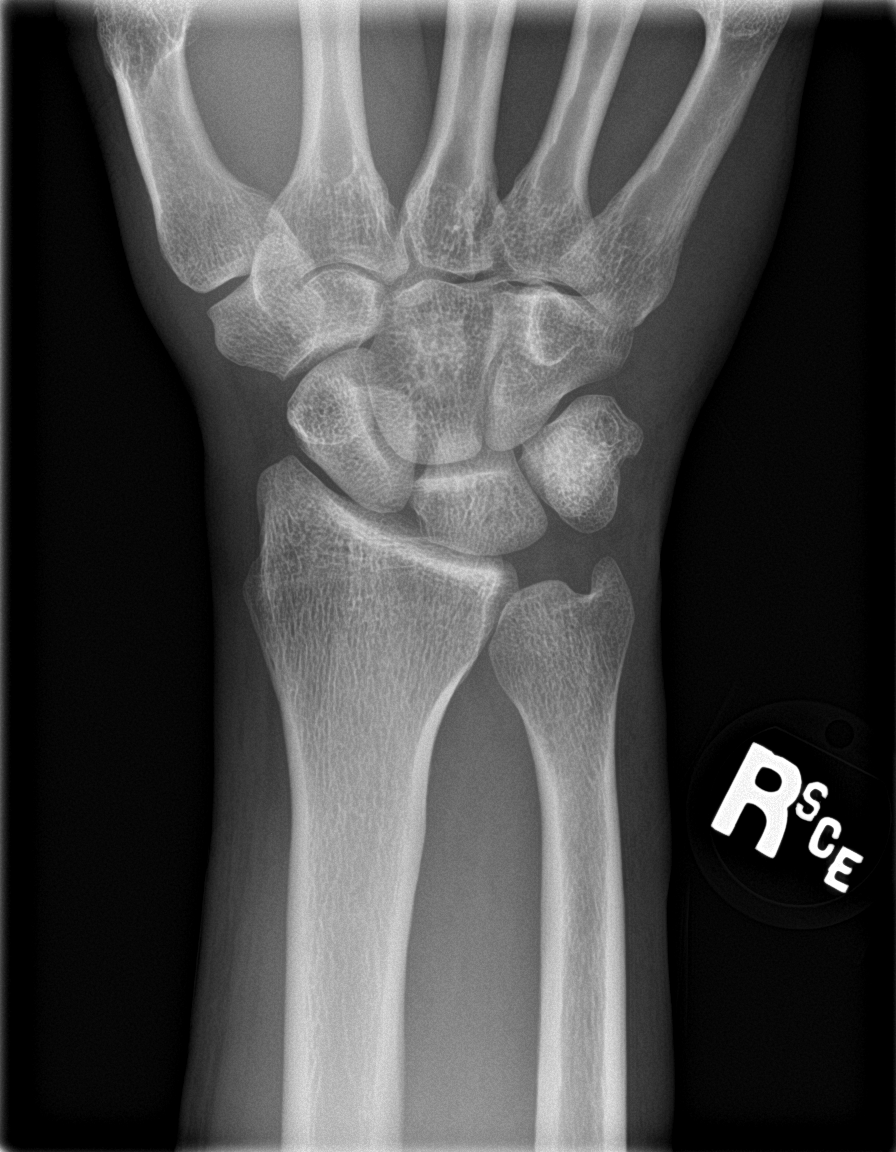

[wrist obl]
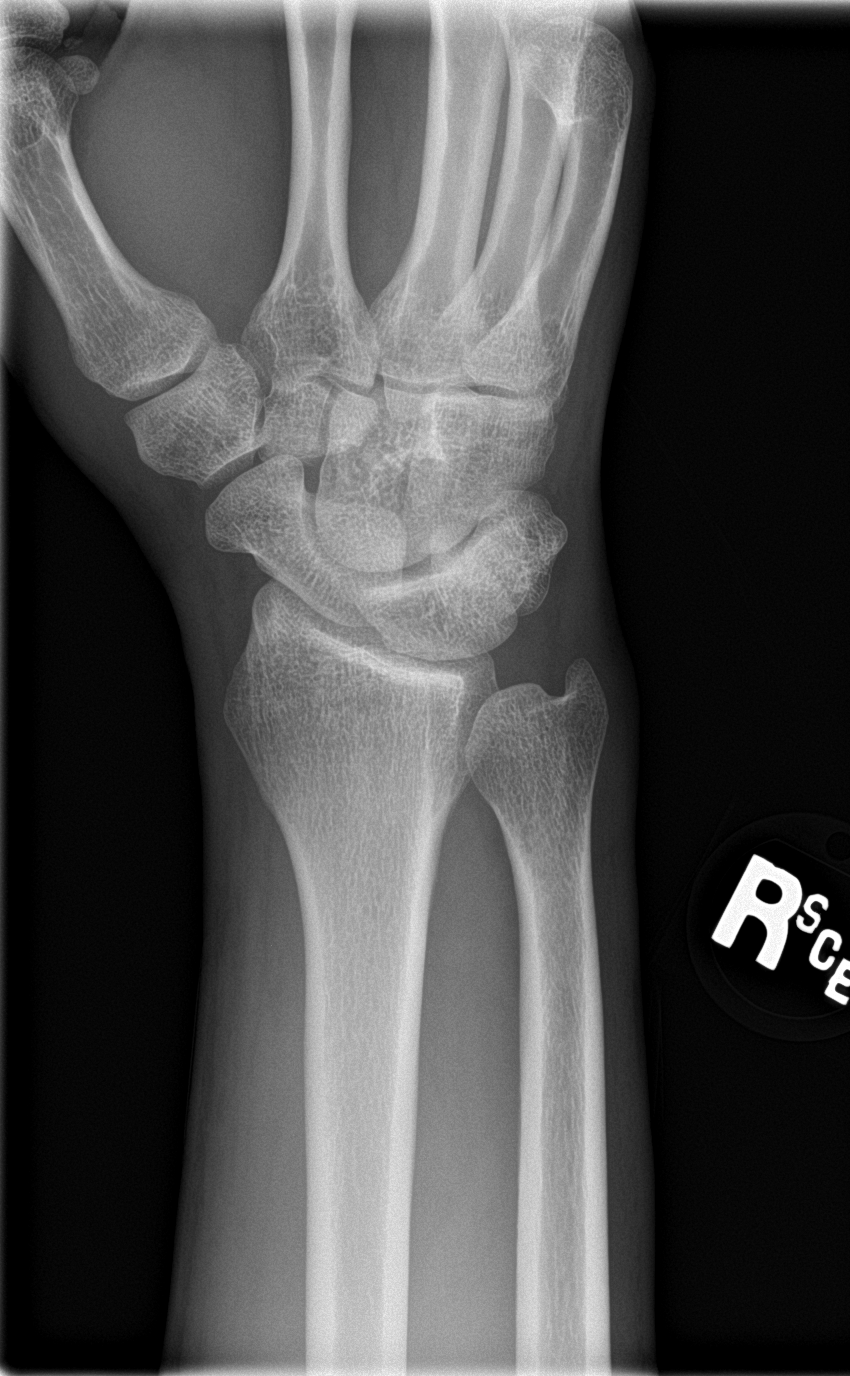

[wrist lat]
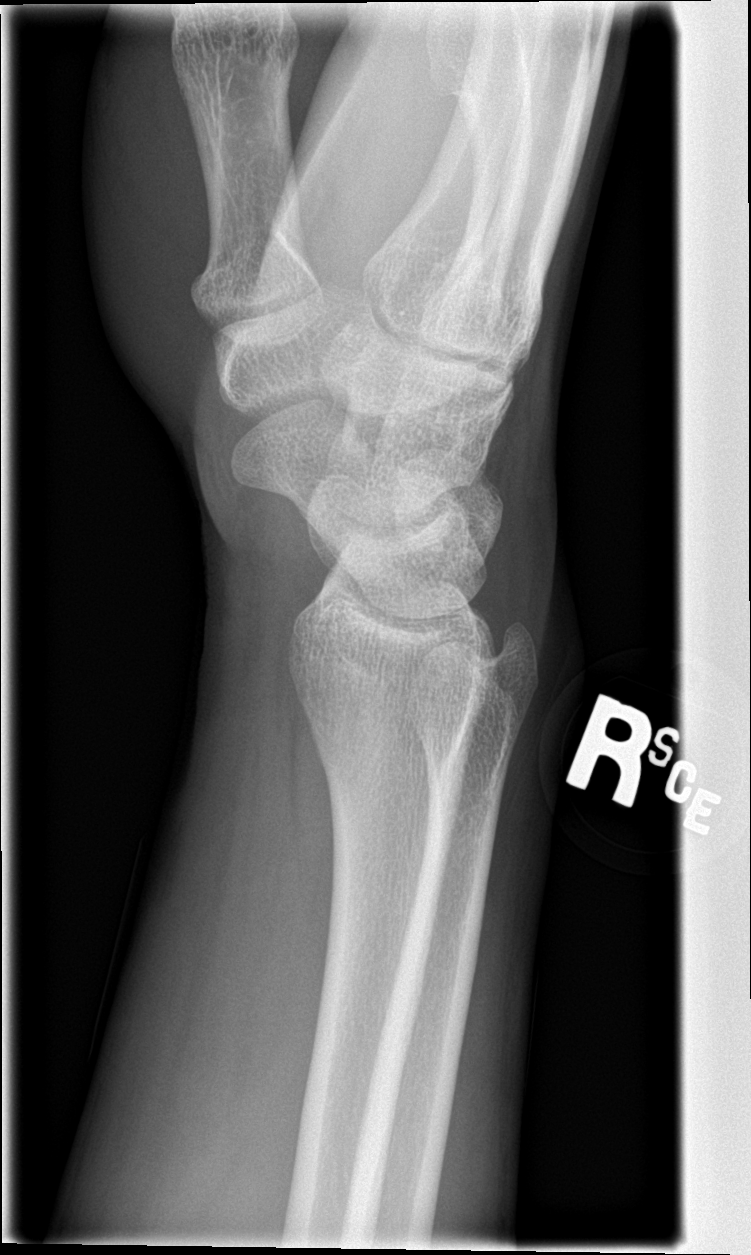

[wrist ap (2 of 2)]
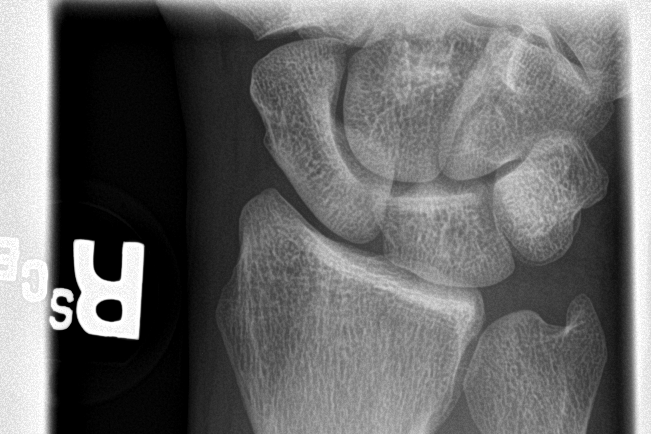

[4 of 4 positions shown; findings below may reference images not displayed]

FINDINGS: There is no evidence of fracture or dislocation. There is no
evidence of arthropathy or other focal bone abnormality. Soft
tissues are unremarkable.
IMPRESSION: Negative.
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
## 2016-10-20 ENCOUNTER — Emergency Department
Admission: EM | Admit: 2016-10-20 | Discharge: 2016-10-20 | Disposition: A | Discharge status: Home / Self Care | Payer: Self-pay | Attending: Emergency Medicine | Admitting: Emergency Medicine

## 2016-10-20 DIAGNOSIS — Z5181 Encounter for therapeutic drug level monitoring: Secondary | ICD-10-CM | POA: Insufficient documentation

## 2016-10-20 DIAGNOSIS — F17210 Nicotine dependence, cigarettes, uncomplicated: Secondary | ICD-10-CM | POA: Insufficient documentation

## 2016-10-20 DIAGNOSIS — F1990 Other psychoactive substance use, unspecified, uncomplicated: Secondary | ICD-10-CM | POA: Insufficient documentation

## 2016-10-20 DIAGNOSIS — F22 Delusional disorders: Secondary | ICD-10-CM | POA: Insufficient documentation

## 2016-10-20 LAB — CBC WITH DIFFERENTIAL/PLATELET
BASOS PCT: 1 %
Basophils Absolute: 0 10*3/uL (ref 0–0.1)
EOS PCT: 1 %
Eosinophils Absolute: 0 10*3/uL (ref 0–0.7)
HEMATOCRIT: 46.7 % (ref 40.0–52.0)
Hemoglobin: 16.3 g/dL (ref 13.0–18.0)
Lymphocytes Relative: 14 %
Lymphs Abs: 0.5 10*3/uL — ABNORMAL LOW (ref 1.0–3.6)
MCH: 34 pg (ref 26.0–34.0)
MCHC: 34.9 g/dL (ref 32.0–36.0)
MCV: 97.3 fL (ref 80.0–100.0)
MONO ABS: 0.4 10*3/uL (ref 0.2–1.0)
MONOS PCT: 12 %
NEUTROS ABS: 2.4 10*3/uL (ref 1.4–6.5)
Neutrophils Relative %: 72 %
PLATELETS: 130 10*3/uL — AB (ref 150–440)
RBC: 4.8 MIL/uL (ref 4.40–5.90)
RDW: 14.3 % (ref 11.5–14.5)
WBC: 3.3 10*3/uL — ABNORMAL LOW (ref 3.8–10.6)

## 2016-10-20 LAB — ETHANOL: Alcohol, Ethyl (B): <5 mg/dL (ref <5)

## 2016-10-20 LAB — COMPREHENSIVE METABOLIC PANEL
ALBUMIN: 4.5 g/dL (ref 3.5–5.0)
ALT: 17 U/L (ref 17–63)
ANION GAP: 6 (ref 5–15)
AST: 32 U/L (ref 15–41)
Alkaline Phosphatase: 55 U/L (ref 38–126)
BILIRUBIN TOTAL: 0.9 mg/dL (ref 0.3–1.2)
BUN: 12 mg/dL (ref 6–20)
CHLORIDE: 103 mmol/L (ref 101–111)
CO2: 25 mmol/L (ref 22–32)
Calcium: 9.3 mg/dL (ref 8.9–10.3)
Creatinine, Ser: 1.21 mg/dL (ref 0.61–1.24)
GFR calc Af Amer: >60 mL/min (ref >60)
GLUCOSE: 108 mg/dL — AB (ref 65–99)
POTASSIUM: 3.5 mmol/L (ref 3.5–5.1)
Sodium: 134 mmol/L — ABNORMAL LOW (ref 135–145)
TOTAL PROTEIN: 8 g/dL (ref 6.5–8.1)

## 2016-10-20 LAB — URINE DRUG SCREEN, QUALITATIVE (ARMC ONLY)
AMPHETAMINES, UR SCREEN: POSITIVE — AB
BARBITURATES, UR SCREEN: NOT DETECTED
BENZODIAZEPINE, UR SCRN: NOT DETECTED
CANNABINOID 50 NG, UR ~~LOC~~: POSITIVE — AB
Cocaine Metabolite,Ur ~~LOC~~: POSITIVE — AB
MDMA (Ecstasy)Ur Screen: NOT DETECTED
Methadone Scn, Ur: NOT DETECTED
OPIATE, UR SCREEN: NOT DETECTED
PHENCYCLIDINE (PCP) UR S: NOT DETECTED
Tricyclic, Ur Screen: NOT DETECTED

## 2016-10-20 LAB — CARBAMAZEPINE LEVEL, TOTAL

## 2016-10-20 MED ORDER — LORAZEPAM 2 MG/ML IJ SOLN
INTRAMUSCULAR | Status: AC
  Administered 2016-10-20: 2 mg via INTRAMUSCULAR
  Filled 2016-10-20: qty 1

## 2016-10-20 MED ORDER — QUETIAPINE FUMARATE ER 300 MG PO TB24
300.0000 mg | ORAL_TABLET | Freq: Once | ORAL | Status: DC
  Filled 2016-10-20: qty 1

## 2016-10-20 MED ORDER — HALOPERIDOL LACTATE 5 MG/ML IJ SOLN
INTRAMUSCULAR | Status: AC
  Administered 2016-10-20: 5 mg via INTRAMUSCULAR
  Filled 2016-10-20: qty 1

## 2016-10-20 MED ORDER — DIPHENHYDRAMINE HCL 50 MG/ML IJ SOLN
INTRAMUSCULAR | Status: AC
  Administered 2016-10-20: 50 mg via INTRAMUSCULAR
  Filled 2016-10-20: qty 1

## 2016-10-20 NOTE — BH Assessment (Signed)
Assessment Note  Dustin Bennett is an 31 y.o. male who presents to the ER via law enforcement due to odd and bizarre behaviors. According to the patient, a "friend dropped me off. I need some help."  Patient states he's been abusing drugs for the last several days as a means to cope with his mood. He reports of having a history of depression and it has worsened, following his recent birthday. "I'm [age] years old and don't have nothing to show for it. I'm like in a rut. I'm stuck and I don't know if I'll ever come out.My life is in this never ending cycle and I don't know..." Patient further reports that he is not sleeping well. He sleeps an average of three to four hours a day. He's having trouble falling and staying asleep. He's isolating more. "I spend most of the time in my room."  During the interview, the patient was lethargic and sluggish. He states it was because of the IM medications he received.  Patient denied SI/HI and AV/H. He also denies any involvement with the legal system and with DSS.  Diagnosis: Bipolar & Substance Use Disorder  Past Medical History:  Past Medical History:  Diagnosis Date  . Bipolar 1 disorder (HCC)   . Depression   . HIV (human immunodeficiency virus infection) (HCC)     Past Surgical History:  Procedure Laterality Date  . LUNG BIOPSY      Family History: No family history on file.  Social History:  reports that he has been smoking Cigarettes.  He has been smoking about 1.00 pack per day. He has never used smokeless tobacco. He reports that he uses drugs, including Marijuana, IV, and Methamphetamines. He reports that he does not drink alcohol.  Additional Social History:  Alcohol / Drug Use Pain Medications: See PTA Prescriptions: See PTA Over the Counter: See PTA History of alcohol / drug use?: Yes Negative Consequences of Use: Personal relationships, Financial, Work / School Withdrawal Symptoms: Nausea / Vomiting, Weakness, Sweats Substance  #1 Name of Substance 1: Cannabis 1 - Last Use / Amount: 10/19/2016 Substance #2 Name of Substance 2: Alcohol 2 - Last Use / Amount: 09/2016 Substance #3 Name of Substance 3: Opioids Abuse 3 - Last Use / Amount: 10/20/2016 Substance #4 Name of Substance 4: Methamphetamine 4 - Last Use / Amount: 10/16/2016  CIWA: CIWA-Ar BP: (!) 146/103 Pulse Rate: (!) 116 COWS:    Allergies: No Known Allergies  Home Medications:  (Not in a hospital admission)  OB/GYN Status:  No LMP for male patient.  General Assessment Data Location of Assessment: [facility] ED TTS Assessment: In system Is this a Tele or Face-to-Face Assessment?: Face-to-Face Is this an Initial Assessment or a Re-assessment for this encounter?: Initial Assessment Marital status: Single Maiden name: n/a Is patient pregnant?: No Pregnancy Status: No Living Arrangements: Parent (Mother) Can pt return to current living arrangement?: No Admission Status: Involuntary Is patient capable of signing voluntary admission?: No (Under IVC) Referral Source: Self/Family/Friend Insurance type: None  Medical Screening Exam [facility] Walk-in ONLY) Medical Exam completed: Yes  Crisis Care Plan Living Arrangements: Parent (Mother) Legal Guardian: Other: (Self) Name of Psychiatrist: Reports of none Name of Therapist: Reports of none  Education Status Is patient currently in school?: No Current Grade: n/a Highest grade of school patient has completed: Some [employer] Name of school: n/a Contact person: n/a  Risk to self with the past 6 months Suicidal Ideation: No-Not Currently/Within Last 6 Months Has patient  been a risk to self within the past 6 months prior to admission? : No Suicidal Intent: No Has patient had any suicidal intent within the past 6 months prior to admission? : No Is patient at risk for suicide?: No Suicidal Plan?: No-Not Currently/Within Last 6 Months Has patient had any suicidal plan within the past 6 months prior to  admission? : No Access to Means: No What has been your use of drugs/alcohol within the last 12 months?: Cannabis, Alcohol, Opioids Abuse & Methamphetamine Previous Attempts/Gestures: No How many times?: 0 Other Self Harm Risks: Active addiction Triggers for Past Attempts: None known Intentional Self Injurious Behavior: None Family Suicide History: No Recent stressful life event(s): Recent negative physical changes, Turmoil (Comment), Trauma (Comment) (Active Addiction) Persecutory voices/beliefs?: No Depression: Yes Depression Symptoms: Feeling worthless/self pity, Isolating, Loss of interest in usual pleasures Substance abuse history and/or treatment for substance abuse?: Yes Suicide prevention information given to non-admitted patients: Not applicable  Risk to Others within the past 6 months Homicidal Ideation: No Does patient have any lifetime risk of violence toward others beyond the six months prior to admission? : No Thoughts of Harm to Others: No Current Homicidal Intent: No Current Homicidal Plan: No Access to Homicidal Means: No Identified Victim: Reports of none History of harm to others?: No Assessment of Violence: None Noted Violent Behavior Description: Reports of none Does patient have access to weapons?: No Criminal Charges Pending?: No Does patient have a court date: No Is patient on probation?: No  Psychosis Hallucinations: None noted Delusions: None noted  Mental Status Report Appearance/Hygiene: In scrubs, Unremarkable Eye Contact: Fair Motor Activity: Freedom of movement, Unremarkable Speech: Logical/coherent, Unremarkable Level of Consciousness: Alert Mood: Depressed, Anxious, Helpless, Sad, Pleasant Affect: Anxious, Appropriate to circumstance, Depressed, Sad Anxiety Level: Minimal Thought Processes: Coherent, Relevant Judgement: Partial Orientation: Person, Place, Time, Situation, Appropriate for developmental age Obsessive Compulsive  Thoughts/Behaviors: Minimal  Cognitive Functioning Concentration: Normal Memory: Recent Intact, Remote Intact IQ: Average Insight: Fair Impulse Control: Poor Appetite: Fair Weight Loss: 0 Weight Gain: 0 Sleep: Decreased Total Hours of Sleep: 4 (Trouble falling and staying asleep)  ADLScreening [facility] Assessment Services) Patient's cognitive ability adequate to safely complete daily activities?: Yes Patient able to express need for assistance with ADLs?: Yes Independently performs ADLs?: Yes (appropriate for developmental age)  Prior Inpatient Therapy Prior Inpatient Therapy: Yes Prior Therapy Dates: 11/2015 Prior Therapy Facilty/Provider(s): [facility] Reason for Treatment: Bi-Polar  Prior Outpatient Therapy Prior Outpatient Therapy: Yes Prior Therapy Dates: 2017 Prior Therapy Facilty/Provider(s): [facility], [place] Location Reason for Treatment: Bipolar and Substance Use Does patient have an ACCT team?: No Does patient have Intensive In-House Services?  : No Does patient have [facility] services? : No Does patient have P4CC services?: No  ADL Screening (condition at time of admission) Patient's cognitive ability adequate to safely complete daily activities?: Yes Is the patient deaf or have difficulty hearing?: No Does the patient have difficulty seeing, even when wearing glasses/contacts?: No Does the patient have difficulty concentrating, remembering, or making decisions?: No Patient able to express need for assistance with ADLs?: Yes Does the patient have difficulty dressing or bathing?: No Independently performs ADLs?: Yes (appropriate for developmental age) Does the patient have difficulty walking or climbing stairs?: No Weakness of Legs: None Weakness of Arms/Hands: None  Home Assistive Devices/Equipment Home Assistive Devices/Equipment: None  Therapy Consults (therapy consults require a physician order) PT Evaluation Needed: No OT  Evalulation Needed: No SLP Evaluation Needed: No  Abuse/Neglect Assessment (Assessment to be complete while patient is alone) Physical Abuse: Denies Verbal Abuse: Denies Sexual Abuse: Denies Exploitation of patient/patient's resources: Denies Self-Neglect: Denies Values / Beliefs Cultural Requests During Hospitalization: None Spiritual Requests During Hospitalization: None Consults Spiritual Care Consult Needed: No Social Work Consult Needed: No      Additional Information 1:1 In Past 12 Months?: No CIRT Risk: No Elopement Risk: No Does patient have medical clearance?: Yes  Child/Adolescent Assessment Running Away Risk: Denies (Patient is an adult)  Disposition:  Disposition Initial Assessment Completed for this Encounter: Yes Disposition of Patient: Other dispositions  On Site Evaluation by:   Reviewed with Physician:    Lilyan Gilford MS, LCAS, LPC, NCC, CCSI Therapeutic Triage Specialist 10/20/2016 2:23 PM
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
## 2016-10-20 NOTE — ED Notes (Signed)
Patient locker key placed in pyxis. Locker Key # [phone]
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
## 2016-10-20 NOTE — ED Notes (Signed)
Patient currently too paranoid to try and change to paper scrubs. Waiting on medication to start working. Also waiting on male RN to become available to change him out.

## 2016-10-20 NOTE — ED Notes (Signed)
ATTEMPTED TO GET PT'S V/S. PT WOULD NOT BE STILL. RN NOTIFIED.

## 2016-10-20 NOTE — ED Notes (Addendum)
Patient still unable to be dressed out due to continued paranoia and no male tech

## 2016-10-20 NOTE — ED Notes (Signed)
Valuable locked in security obtained by [employer] officer and given back to patient. Two 20$ bills.

## 2016-10-20 NOTE — ED Provider Notes (Signed)
-----------------------------------------   2:31 PM on 10/20/2016 -----------------------------------------  The patient has been seen by psychiatry, they believe the patient is safe for discharge home at this time. They have rescinded the involuntary commitment. Patient's medical workup is consistent with substance abuse but otherwise negative.   Minna AntisKevin Heike Pounds, MD 10/20/16 1432

## 2016-10-20 NOTE — ED Notes (Signed)
[facility] at bedside.

## 2016-10-20 NOTE — ED Notes (Signed)
Soc canceled due to machine being down/ will call back

## 2016-10-20 NOTE — Discharge Instructions (Signed)
You have been seen in the emergency department for a  psychiatric concern. You have been evaluated both medically as well as psychiatrically. Please follow-up with your outpatient resources provided. Return to the emergency department for any worsening symptoms, or any thoughts of hurting yourself or anyone else so that we may attempt to help you. 

## 2016-10-20 NOTE — ED Notes (Signed)
Pt sleeping. Lunch tray placed on sink.
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
## 2016-10-20 NOTE — ED Notes (Signed)
Pt sleeping, wouldn't wake up. Breakfast tray was placed on sink.

## 2016-10-20 NOTE — ED Notes (Signed)
Patient changed into a gown. Patient still has his cell phone [employer], $40 cash.

## 2016-10-20 NOTE — ED Notes (Signed)
Pt was dressed out without difficulty by [employer]. Clothes placed in bags and stored appropriately with pt name on bag.

## 2016-11-01 ENCOUNTER — Emergency Department
Admission: EM | Admit: 2016-11-01 | Discharge: 2016-11-01 | Disposition: A | Discharge status: Home / Self Care | Payer: Self-pay | Attending: Emergency Medicine | Admitting: Emergency Medicine

## 2016-11-01 DIAGNOSIS — F329 Major depressive disorder, single episode, unspecified: Secondary | ICD-10-CM | POA: Insufficient documentation

## 2016-11-01 DIAGNOSIS — F1994 Other psychoactive substance use, unspecified with psychoactive substance-induced mood disorder: Secondary | ICD-10-CM

## 2016-11-01 DIAGNOSIS — F1510 Other stimulant abuse, uncomplicated: Secondary | ICD-10-CM | POA: Diagnosis present

## 2016-11-01 DIAGNOSIS — F32A Depression, unspecified: Secondary | ICD-10-CM

## 2016-11-01 DIAGNOSIS — Z21 Asymptomatic human immunodeficiency virus [HIV] infection status: Secondary | ICD-10-CM | POA: Diagnosis present

## 2016-11-01 DIAGNOSIS — F1290 Cannabis use, unspecified, uncomplicated: Secondary | ICD-10-CM | POA: Insufficient documentation

## 2016-11-01 DIAGNOSIS — F1420 Cocaine dependence, uncomplicated: Secondary | ICD-10-CM | POA: Diagnosis present

## 2016-11-01 DIAGNOSIS — Z79899 Other long term (current) drug therapy: Secondary | ICD-10-CM | POA: Insufficient documentation

## 2016-11-01 DIAGNOSIS — F17210 Nicotine dependence, cigarettes, uncomplicated: Secondary | ICD-10-CM | POA: Insufficient documentation

## 2016-11-01 DIAGNOSIS — F1910 Other psychoactive substance abuse, uncomplicated: Secondary | ICD-10-CM | POA: Insufficient documentation

## 2016-11-01 LAB — COMPREHENSIVE METABOLIC PANEL
ALK PHOS: 51 U/L (ref 38–126)
ALT: 13 U/L — ABNORMAL LOW (ref 17–63)
ANION GAP: 8 (ref 5–15)
AST: 24 U/L (ref 15–41)
Albumin: 4.5 g/dL (ref 3.5–5.0)
BILIRUBIN TOTAL: 0.7 mg/dL (ref 0.3–1.2)
BUN: 12 mg/dL (ref 6–20)
CALCIUM: 9.4 mg/dL (ref 8.9–10.3)
CO2: 25 mmol/L (ref 22–32)
CREATININE: 1.44 mg/dL — AB (ref 0.61–1.24)
Chloride: 100 mmol/L — ABNORMAL LOW (ref 101–111)
GFR calc non Af Amer: >60 mL/min (ref >60)
Glucose, Bld: 111 mg/dL — ABNORMAL HIGH (ref 65–99)
Potassium: 3.6 mmol/L (ref 3.5–5.1)
Sodium: 133 mmol/L — ABNORMAL LOW (ref 135–145)
Total Protein: 8 g/dL (ref 6.5–8.1)

## 2016-11-01 LAB — CBC
HCT: 47 % (ref 40.0–52.0)
Hemoglobin: 16.2 g/dL (ref 13.0–18.0)
MCH: 33.3 pg (ref 26.0–34.0)
MCHC: 34.5 g/dL (ref 32.0–36.0)
MCV: 96.6 fL (ref 80.0–100.0)
PLATELETS: 156 10*3/uL (ref 150–440)
RBC: 4.87 MIL/uL (ref 4.40–5.90)
RDW: 14.4 % (ref 11.5–14.5)
WBC: 4.1 10*3/uL (ref 3.8–10.6)

## 2016-11-01 LAB — ACETAMINOPHEN LEVEL: Acetaminophen (Tylenol), Serum: 10 ug/mL (ref 10–30)

## 2016-11-01 LAB — URINE DRUG SCREEN, QUALITATIVE (ARMC ONLY)
Amphetamines, Ur Screen: POSITIVE — AB
BARBITURATES, UR SCREEN: NOT DETECTED
Benzodiazepine, Ur Scrn: NOT DETECTED
CANNABINOID 50 NG, UR ~~LOC~~: POSITIVE — AB
Cocaine Metabolite,Ur ~~LOC~~: POSITIVE — AB
MDMA (Ecstasy)Ur Screen: NOT DETECTED
Methadone Scn, Ur: NOT DETECTED
Opiate, Ur Screen: NOT DETECTED
Phencyclidine (PCP) Ur S: NOT DETECTED
TRICYCLIC, UR SCREEN: NOT DETECTED

## 2016-11-01 LAB — SALICYLATE LEVEL

## 2016-11-01 LAB — ETHANOL

## 2016-11-01 MED ORDER — QUETIAPINE FUMARATE 300 MG PO TABS: 300.0000 mg | ORAL_TABLET | Freq: Every day | ORAL | 0 refills | Status: DC

## 2016-11-01 NOTE — ED Triage Notes (Signed)
"  I need to be admitted for mental health.  I need to be in an environment by myself away from outside influences."  Patient denies wanting to self harm at this time.

## 2016-11-01 NOTE — ED Notes (Signed)

## 2016-11-01 NOTE — ED Notes (Signed)
Pt given breakfast tray

## 2016-11-01 NOTE — Consult Note (Signed)
[facility] Face-to-Face Psychiatry Consult   Reason for Consult:  31 year old man with history of mood instability and substance abuse came to the hospital feeling out of control with depressed mood Referring Physician:  Mariea Clonts Patient Identification: Dustin Bennett MRN:  [id number] Principal Diagnosis: Substance induced mood disorder [facility]) Diagnosis:   Patient Active Problem List   Diagnosis Date Noted  . Substance induced mood disorder ([place]) [F19.94] 11/01/2016  . Tobacco use disorder [F17.200] 12/09/2015  . 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 Cannabis use disorder, severe, dependence ([place]) [F12.20] 07/25/2015  . Amphetamine abuse [F15.10] 07/25/2015  . Cocaine use disorder, severe, dependence ([place]) [F14.20] 07/25/2015  . HIV positive ([place]) [Z21] 07/25/2015    Total Time spent with patient: 1 hour  Subjective:   Dustin Bennett is a 31 y.o. male patient admitted with "yesterday I was intoxicated".  HPI:  Patient interviewed chart reviewed. Patient familiar from previous evaluations. 31 year old man with a history of drug abuse especially amphetamines. He comes to the hospital stating he is feeling very stressed out and overwhelmed. Hasn't slept in 2-3 days. He was having an argument with his mother that escalated to the point that he felt like he needed to come to the hospital. Mood feels anxious and he feels like his thoughts are racing all of the time. Feels like he can't get his life together. Passively hopeless but no suicidal intent or plan. No active psychosis. Patient is a little evasive about the drugs he is using but his drug screen is positive once again for amphetamines and cocaine. He says he has not been going for any outpatient treatment. Patient has been referred more than once to go to [facility] but is not compliant. He says he is taking his outpatient HIV medicine.  Medical history: HIV-positive. Gets his medication through the health  Department and says he has been compliant.  Social history: Patient is currently living with his mother. Does not work outside the home. He was in prison for several years and since getting out sounds he really hasn't had any kind of organized normal life or behavior.  Substance abuse history: Long history of abuse of substances mostly stimulants he's been to our emergency room several times in the past for almost identical presentations.  Past Psychiatric History: Patient has a history of mood instability mostly related to substance abuse. He says he had had a diagnosis of bipolar once in the past and had felt that being on Seroquel was helpful at least with getting him to sleep regularly. Doesn't report a history of suicide attempts or psychosis.  Risk to Self: Suicidal Ideation: No Suicidal Intent: No Is patient at risk for suicide?: No Suicidal Plan?: No Access to Means: No What has been your use of drugs/alcohol within the last 12 months?: Cocaine How many times?: 0 Other Self Harm Risks: Active addiction Triggers for Past Attempts: None known Intentional Self Injurious Behavior: None Risk to Others: Homicidal Ideation: No Thoughts of Harm to Others: No Current Homicidal Intent: No Current Homicidal Plan: No Access to Homicidal Means: No Identified Victim: NA History of harm to others?: No Assessment of Violence: None Noted Violent Behavior Description: NA Does patient have access to weapons?: No Criminal Charges Pending?: No Does patient have a court date: No Prior Inpatient Therapy: Prior Inpatient Therapy: Yes Prior Therapy Dates: 11/2015 Prior Therapy Facilty/Provider(s): [facility] Reason for Treatment: Bi-Polar Prior Outpatient Therapy: Prior Outpatient Therapy: Yes Prior Therapy Dates: 2017  Prior Therapy Facilty/Provider(s): [facility], [place] Location Reason for Treatment: Bipolar and Substance Use Does patient have an ACCT team?: No Does  patient have Intensive In-House Services?  : No Does patient have [facility] services? : No Does patient have P4CC services?: No  Past Medical History:  Past Medical History:  Diagnosis Date  . Bipolar 1 disorder ([place])   . Depression   . HIV (human immunodeficiency virus infection) ([place])     Past Surgical History:  Procedure Laterality Date  . LUNG BIOPSY     Family History: No family history on file. Family Psychiatric  History: No known family history Social History:  History  Alcohol Use No     History  Drug Use  . Types: Marijuana, IV, Methamphetamines    Social History   Social History  . Marital status: Single    Spouse name: N/A  . Number of children: N/A  . Years of education: N/A   Social History Main Topics  . Smoking status: Current Every Day Smoker    Packs/day: 1.00    Types: Cigarettes  . Smokeless tobacco: Never Used  . Alcohol use No  . Drug use: Yes    Types: Marijuana, IV, Methamphetamines  . Sexual activity: Not on file   Other Topics Concern  . Not on file   Social History Narrative  . No narrative on file   Additional Social History:    Allergies:  No Known Allergies  Labs:  Results for orders placed or performed during the hospital encounter of 11/01/16 (from the past 48 hour(s))  Comprehensive metabolic panel     Status: Abnormal   Collection Time: 11/01/16  4:49 AM  Result Value Ref Range   Sodium 133 (L) 135 - 145 mmol/L   Potassium 3.6 3.5 - 5.1 mmol/L   Chloride 100 (L) 101 - 111 mmol/L   CO2 25 22 - 32 mmol/L   Glucose, Bld 111 (H) 65 - 99 mg/dL   BUN 12 6 - 20 mg/dL   Creatinine, Ser 1.44 (H) 0.61 - 1.24 mg/dL   Calcium 9.4 8.9 - 10.3 mg/dL   Total Protein 8.0 6.5 - 8.1 g/dL   Albumin 4.5 3.5 - 5.0 g/dL   AST 24 15 - 41 U/L   ALT 13 (L) 17 - 63 U/L   Alkaline Phosphatase 51 38 - 126 U/L   Total Bilirubin 0.7 0.3 - 1.2 mg/dL   GFR calc non Af Amer >60 >60 mL/min   GFR calc Af Amer >60 >60 mL/min    Comment:  (NOTE) The eGFR has been calculated using the CKD EPI equation. This calculation has not been validated in all clinical situations. eGFR's persistently <60 mL/min signify possible Chronic Kidney Disease.    Anion gap 8 5 - 15  Ethanol     Status: None   Collection Time: 11/01/16  4:49 AM  Result Value Ref Range   Alcohol, Ethyl (B) <5 <5 mg/dL    Comment:        LOWEST DETECTABLE LIMIT FOR SERUM ALCOHOL IS 5 mg/dL FOR MEDICAL PURPOSES ONLY   Salicylate level     Status: None   Collection Time: 11/01/16  4:49 AM  Result Value Ref Range   Salicylate Lvl <5.0 2.8 - 30.0 mg/dL  Acetaminophen level     Status: None   Collection Time: 11/01/16  4:49 AM  Result Value Ref Range   Acetaminophen (Tylenol), Serum 10 10 - 30 ug/mL    Comment:  THERAPEUTIC CONCENTRATIONS VARY SIGNIFICANTLY. A RANGE OF 10-30 ug/mL MAY BE AN EFFECTIVE CONCENTRATION FOR MANY PATIENTS. HOWEVER, SOME ARE BEST TREATED AT CONCENTRATIONS OUTSIDE THIS RANGE. ACETAMINOPHEN CONCENTRATIONS >150 ug/mL AT 4 HOURS AFTER INGESTION AND >50 ug/mL AT 12 HOURS AFTER INGESTION ARE OFTEN ASSOCIATED WITH TOXIC REACTIONS.   cbc     Status: None   Collection Time: 11/01/16  4:49 AM  Result Value Ref Range   WBC 4.1 3.8 - 10.6 K/uL   RBC 4.87 4.40 - 5.90 MIL/uL   Hemoglobin 16.2 13.0 - 18.0 g/dL   HCT 47.0 40.0 - 52.0 %   MCV 96.6 80.0 - 100.0 fL   MCH 33.3 26.0 - 34.0 pg   MCHC 34.5 32.0 - 36.0 g/dL   RDW 14.4 11.5 - 14.5 %   Platelets 156 150 - 440 K/uL  Urine Drug Screen, Qualitative     Status: Abnormal   Collection Time: 11/01/16  5:00 AM  Result Value Ref Range   Tricyclic, Ur Screen NONE DETECTED NONE DETECTED   Amphetamines, Ur Screen POSITIVE (A) NONE DETECTED   MDMA (Ecstasy)Ur Screen NONE DETECTED NONE DETECTED   Cocaine Metabolite,Ur [place] POSITIVE (A) NONE DETECTED   Opiate, Ur Screen NONE DETECTED NONE DETECTED   Phencyclidine (PCP) Ur S NONE DETECTED NONE DETECTED   Cannabinoid 50 Ng, Ur [place]  POSITIVE (A) NONE DETECTED   Barbiturates, Ur Screen NONE DETECTED NONE DETECTED   Benzodiazepine, Ur Scrn NONE DETECTED NONE DETECTED   Methadone Scn, Ur NONE DETECTED NONE DETECTED    Comment: (NOTE) 509  Tricyclics, urine               Cutoff 1000 ng/mL 200  Amphetamines, urine             Cutoff 1000 ng/mL 300  MDMA (Ecstasy), urine           Cutoff 500 ng/mL 400  Cocaine Metabolite, urine       Cutoff 300 ng/mL 500  Opiate, urine                   Cutoff 300 ng/mL 600  Phencyclidine (PCP), urine      Cutoff 25 ng/mL 700  Cannabinoid, urine              Cutoff 50 ng/mL 800  Barbiturates, urine             Cutoff 200 ng/mL 900  Benzodiazepine, urine           Cutoff 200 ng/mL 1000 Methadone, urine                Cutoff 300 ng/mL 1100 1200 The urine drug screen provides only a preliminary, unconfirmed 1300 analytical test result and should not be used for non-medical 1400 purposes. Clinical consideration and professional judgment should 1500 be applied to any positive drug screen result due to possible 1600 interfering substances. A more specific alternate chemical method 1700 must be used in order to obtain a confirmed analytical result.  1800 Gas chromato graphy / mass spectrometry (GC/MS) is the preferred 1900 confirmatory method.     No current facility-administered medications for this encounter.    Current Outpatient Prescriptions  Medication Sig Dispense Refill  . TRIUMEQ 600-50-300 MG tablet Take 1 tablet by mouth daily.  11  . QUEtiapine (SEROQUEL) 300 MG tablet Take 1 tablet (300 mg total) by mouth at bedtime. 30 tablet 0    Musculoskeletal: Strength & Muscle Tone: within normal limits Gait &  Station: normal Patient leans: N/A  Psychiatric Specialty Exam: Physical Exam  Nursing note and vitals reviewed. Constitutional: He appears well-developed and well-nourished.  HENT:  Head: Normocephalic and atraumatic.  Eyes: Conjunctivae are normal. Pupils are equal,  round, and reactive to light.  Neck: Normal range of motion.  Cardiovascular: Regular rhythm and normal heart sounds.   Respiratory: Effort normal. No respiratory distress.  GI: Soft.  Musculoskeletal: Normal range of motion.  Neurological: He is alert.  Skin: Skin is warm and dry.  Psychiatric: His speech is normal and behavior is normal. His mood appears anxious. Thought content is not paranoid. He expresses impulsivity. He expresses no homicidal and no suicidal ideation. He exhibits abnormal recent memory.    Review of Systems  Constitutional: Negative.   HENT: Negative.   Eyes: Negative.   Respiratory: Negative.   Cardiovascular: Negative.   Gastrointestinal: Negative.   Musculoskeletal: Negative.   Skin: Negative.   Neurological: Negative.   Psychiatric/Behavioral: Positive for depression, memory loss and substance abuse. Negative for hallucinations and suicidal ideas. The patient is nervous/anxious and has insomnia.     Blood pressure 126/81, pulse 80, temperature 97.8 F (36.6 C), temperature source Oral, resp. rate 15, height '5\' 10"'$  (1.778 m), weight 79.4 kg (175 lb), SpO2 99 %.Body mass index is 25.11 kg/m.  General Appearance: Fairly Groomed  Eye Contact:  Fair  Speech:  Clear and Coherent  Volume:  Normal  Mood:  Dysphoric  Affect:  Congruent  Thought Process:  Goal Directed  Orientation:  Full (Time, Place, and Person)  Thought Content:  Logical  Suicidal Thoughts:  No  Homicidal Thoughts:  No  Memory:  Immediate;   Good Recent;   Fair Remote;   Fair  Judgement:  Impaired  Insight:  Shallow  Psychomotor Activity:  Decreased  Concentration:  Concentration: Fair  Recall:  [employer] of Knowledge:  Fair  Language:  Good  Akathisia:  No  Handed:  Right  AIMS (if indicated):     Assets:  Communication Skills Housing Physical Health  ADL's:  Intact  Cognition:  WNL  Sleep:        Treatment Plan Summary: Medication management and Plan 31 year old man  with a history of substance abuse and mood disorder. Currently he is coming down from his intoxication. Not psychotic not agitated not threatening. Not actively suicidal. Patient does not require inpatient treatment. Supportive counseling and encouragement that he follow-up with recommended outpatient treatment. I did agree to give him a prescription to be back on his Seroquel 300 mg at night as was prescribed in the past. He will continue taking his regular HIV medicine. Strongly urged him to actually make the effort to get involved with outpatient treatment. He is reviewed with emergency room physician and TTS he can be discharged from the emergency room.  Disposition: Patient does not meet criteria for psychiatric inpatient admission. Supportive therapy provided about ongoing stressors.  Alethia Berthold, MD 11/01/2016 4:28 PM

## 2016-11-01 NOTE — ED Notes (Signed)
Pt observed with no unusual behavior  Appropriate to stimulation  No verbalized needs or concerns at this time  NAD assessed  Continue to monitor 

## 2016-11-01 NOTE — ED Notes (Signed)
Lunch tray placed in room. Pt sleeping 

## 2016-11-01 NOTE — ED Provider Notes (Signed)
[facility] Emergency Department Provider Note   ____________________________________________   First MD Initiated Contact with Patient 11/01/16 0518     (approximate)  I have reviewed the triage vital signs and the nursing notes.   HISTORY  Chief Complaint Psychiatric Evaluation    HPI Dustin Bennett is a 31 y.o. male who comes into the hospital today because he wants to get himself some treatment. He reports that he wants to get clean and get off narcotics and marijuana. He reports that he has been diagnosed with mental health problem but they haven't told him exactly what. He reports that he was on Seroquel in the past but they took him off of it. He reports that he has been depressed as well. He reports he needs some help. He feels like he lives in an unsafe environment. He denies any suicidal or homicidal ideation. He is denying any hallucinations. He has thought of hurting himself in the past. He reports he would never do it. He states that the people in his life or not supportive. He last used marijuana on Friday and he has been drinking beer today. The patient is here for evaluation.   Past Medical History:  Diagnosis Date  . Bipolar 1 disorder ([facility])   . Depression   . HIV (human immunodeficiency virus infection) [facility])     Patient Active Problem List   Diagnosis Date Noted  . Tobacco use disorder 12/09/2015  . 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 Cannabis use disorder, severe, dependence ([facility]) 07/25/2015  . 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 Cocaine use disorder, severe, dependence ([facility]) 07/25/2015  . HIV positive ([facility]) 07/25/2015    Past Surgical History:  Procedure Laterality Date  . LUNG BIOPSY      Prior to Admission medications   Medication Sig Start Date End Date Taking? Authorizing Provider  QUEtiapine (SEROQUEL) 300 MG tablet Take 1 tablet (300 mg total) by mouth at bedtime. 12/12/15  Yes  Jolanta B Pucilowska, MD  TRIUMEQ 600-50-300 MG tablet Take 1 tablet by mouth daily. 09/28/16  Yes Historical Provider, MD    Allergies Patient has no known allergies.  No family history on file.  Social History Social History  Substance Use Topics  . Smoking status: Current Every Day Smoker    Packs/day: 1.00    Types: Cigarettes  . Smokeless tobacco: Never Used  . Alcohol use No    Review of Systems Constitutional: No fever/chills Eyes: No visual changes. ENT: No sore throat. Cardiovascular: Denies chest pain. Respiratory: Denies shortness of breath. Gastrointestinal: No abdominal pain.  No nausea, no vomiting.  No diarrhea.  No constipation. Genitourinary: Negative for dysuria. Musculoskeletal: Negative for back pain. Skin: Negative for rash. Neurological: Negative for headaches, focal weakness or numbness.  10-point ROS otherwise negative.  ____________________________________________   PHYSICAL EXAM:  VITAL SIGNS: ED Triage Vitals [11/01/16 0448]  Enc Vitals Group     BP 130/81     Pulse Rate (!) 117     Resp 20     Temp 98.4 F (36.9 C)     Temp Source Oral     SpO2 98 %     Weight 175 lb (79.4 kg)     Height  (1.778 m)     Head Circumference      Peak Flow      Pain Score      Pain Loc      Pain Edu?  Excl. in GC?     Constitutional: Alert and oriented. Well appearing and in mild distress. Eyes: Conjunctivae are normal. PERRL. EOMI. Head: Atraumatic. Nose: No congestion/rhinnorhea. Mouth/Throat: Mucous membranes are moist.  Oropharynx non-erythematous. Cardiovascular: Tachycardia  regular rhythm. Grossly normal heart sounds.  Good peripheral circulation. Respiratory: Normal respiratory effort.  No retractions. Lungs CTAB. Gastrointestinal: Soft and nontender. No distention. Positive bowel sounds Musculoskeletal: No lower extremity tenderness nor edema.   Neurologic:  Normal speech and language.  Skin:  Skin is warm, dry and intact.    Psychiatric: Mood and affect are normal. Speech and behavior are normal.  ____________________________________________   LABS (all labs ordered are listed, but only abnormal results are displayed)  Labs Reviewed  COMPREHENSIVE METABOLIC PANEL - Abnormal; Notable for the following:       Result Value   Sodium 133 (*)    Chloride 100 (*)    Glucose, Bld 111 (*)    Creatinine, Ser 1.44 (*)    ALT 13 (*)    All other components within normal limits  URINE DRUG SCREEN, QUALITATIVE ([facility] ONLY) - Abnormal; Notable for the following:    Amphetamines, Ur Screen POSITIVE (*)    Cocaine Metabolite,Ur [place] POSITIVE (*)    Cannabinoid 50 Ng, Ur [place] POSITIVE (*)    All other components within normal limits  ETHANOL  SALICYLATE LEVEL  ACETAMINOPHEN LEVEL  CBC   ____________________________________________  EKG  none ____________________________________________  RADIOLOGY  none ____________________________________________   PROCEDURES  Procedure(s) performed: None  Procedures  Critical Care performed: No  ____________________________________________   INITIAL IMPRESSION / ASSESSMENT AND PLAN / ED COURSE  Pertinent labs & imaging results that were available during my care of the patient were reviewed by me and considered in my medical decision making (see chart for details).  This is a 31 year old male who comes into the hospital today requesting help for his mental problems. The patient reports he has been using drugs and he is feeling depressed. The patient does have a history of bipolar disorder. I will have the patient evaluated by TTS in by psych.      ____________________________________________   FINAL CLINICAL IMPRESSION(S) / ED DIAGNOSES  Final diagnoses:  Depression, unspecified depression type  Polysubstance abuse      NEW MEDICATIONS STARTED DURING THIS VISIT:  New Prescriptions   No medications on file     Note:  This document was prepared  using Dragon voice recognition software and may include unintentional dictation errors.    Rebecka Apley, MD 11/01/16 [phone]

## 2016-11-01 NOTE — ED Notes (Signed)
While waiting for male chaperone, patient chose to change out into burgundy scrubs before the chaperone arrived.  This male tech witnessed patient totally remove all personal items and put them in his belongings bag.  Patient is dressed in scrubs and personal belongings locked in storage.  Belongings include sneakers, pants , top.

## 2016-11-01 NOTE — BH Assessment (Signed)
Assessment Note  Dustin Bennett is a 31 year old male that requests assistance with detox.   Patient UDS is positive for amphetamines, cocaine and cannabis.     Patient reports using daily.  Patient is not able to remember how much he uses daily.  Patient reports prior detox and treatment two years ago.  Patient reports that his last use was before coming to the ED.  Patient denies withdrawal symptoms.  Patient denies a history of seizures or DT's.   Patient denies SI/HI/psychosis.  Patient reports that he lives in an unsafe environment that contributes to his drug abuse.   Patient reports that he is unemployed and lives with his mother.   Patient reports that the people in his life or not supportive.  Diagnosis: Cocaine Abuse, Severe and Major Depressive Disorder   Past Medical History:  Past Medical History:  Diagnosis Date  . Bipolar 1 disorder (HCC)   . Depression   . HIV (human immunodeficiency virus infection) (HCC)     Past Surgical History:  Procedure Laterality Date  . LUNG BIOPSY      Family History: No family history on file.  Social History:  reports that he has been smoking Cigarettes.  He has been smoking about 1.00 pack per day. He has never used smokeless tobacco. He reports that he uses drugs, including Marijuana, IV, and Methamphetamines. He reports that he does not drink alcohol.  Additional Social History:  Alcohol / Drug Use History of alcohol / drug use?: Yes Longest period of sobriety (when/how long): Unable to remember Negative Consequences of Use: Financial, Legal, Personal relationships, Work / School Withdrawal Symptoms:  (None Reported) Substance #1 Name of Substance 1: Cocaine 1 - Age of First Use: 19 1 - Amount (size/oz): Varies 1 - Frequency: Varies  1 - Duration: For the past several years 1 - Last Use / Amount: Varies  CIWA: CIWA-Ar BP: 130/81 Pulse Rate: (!) 117 COWS:    Allergies: No Known Allergies  Home Medications:  (Not in a  hospital admission)  OB/GYN Status:  No LMP for male patient.  General Assessment Data Location of Assessment: [facility] ED TTS Assessment: In system Is this a Tele or Face-to-Face Assessment?: Face-to-Face Is this an Initial Assessment or a Re-assessment for this encounter?: Initial Assessment Marital status: Single Maiden name: NA Is patient pregnant?: No Pregnancy Status: No Living Arrangements: Parent Can pt return to current living arrangement?:  (Lives with his mother) Admission Status: Voluntary Is patient capable of signing voluntary admission?: Yes Referral Source: Self/Family/Friend Insurance type: Self Pay   Medical Screening Exam [facility] Walk-in ONLY) Medical Exam completed:  (NA)  Crisis Care Plan Living Arrangements: Parent Legal Guardian:  (NA) Name of Psychiatrist: Reports of none Name of Therapist: Reports of none  Education Status Is patient currently in school?: No Current Grade: NA Highest grade of school patient has completed: College Name of school: NA Contact person: NA  Risk to self with the past 6 months Suicidal Ideation: No Has patient been a risk to self within the past 6 months prior to admission? : No Suicidal Intent: No Has patient had any suicidal intent within the past 6 months prior to admission? : No Is patient at risk for suicide?: No Suicidal Plan?: No Has patient had any suicidal plan within the past 6 months prior to admission? : No Access to Means: No What has been your use of drugs/alcohol within the last 12 months?: Cocaine Previous Attempts/Gestures: No How many times?:  0 Other Self Harm Risks: Active addiction Triggers for Past Attempts: None known Intentional Self Injurious Behavior: None Family Suicide History: No Recent stressful life event(s): Job Loss, Financial Problems, Legal Issues, Conflict (Comment) (Strained relationship with family due to substance abuse) Persecutory voices/beliefs?: No Depression: Yes Depression  Symptoms: Despondent, Tearfulness, Fatigue, Guilt, Loss of interest in usual pleasures, Feeling worthless/self pity Substance abuse history and/or treatment for substance abuse?: Yes Suicide prevention information given to non-admitted patients: Not applicable  Risk to Others within the past 6 months Homicidal Ideation: No Does patient have any lifetime risk of violence toward others beyond the six months prior to admission? : No Thoughts of Harm to Others: No Current Homicidal Intent: No Current Homicidal Plan: No Access to Homicidal Means: No Identified Victim: NA History of harm to others?: No Assessment of Violence: None Noted Violent Behavior Description: NA Does patient have access to weapons?: No Criminal Charges Pending?: No Does patient have a court date: No Is patient on probation?: No  Psychosis Hallucinations: None noted Delusions: None noted  Mental Status Report Appearance/Hygiene: In scrubs, Unremarkable Eye Contact: Poor Motor Activity: Freedom of movement, Restlessness Speech: Logical/coherent, Unremarkable Level of Consciousness: Alert Mood: Depressed, Anxious Affect: Anxious, Appropriate to circumstance, Depressed, Sad Anxiety Level: Minimal Thought Processes: Relevant, Coherent Judgement: Impaired Orientation: Person, Place, Time, Situation, Appropriate for developmental age Obsessive Compulsive Thoughts/Behaviors: None  Cognitive Functioning Concentration: Decreased Memory: Recent Intact, Remote Intact IQ: Average Insight: Poor Impulse Control: Poor Appetite: Poor Weight Loss: 7 Weight Gain: 0 Sleep: Decreased Total Hours of Sleep: 3 Vegetative Symptoms: Decreased grooming, Not bathing, Staying in bed  ADLScreening [facility] Assessment Services) Patient's cognitive ability adequate to safely complete daily activities?: Yes Patient able to express need for assistance with ADLs?: Yes Independently performs ADLs?: Yes (appropriate for developmental  age)  Prior Inpatient Therapy Prior Inpatient Therapy: Yes Prior Therapy Dates: 11/2015 Prior Therapy Facilty/Provider(s): [facility] Reason for Treatment: Bi-Polar  Prior Outpatient Therapy Prior Outpatient Therapy: Yes Prior Therapy Dates: 2017 Prior Therapy Facilty/Provider(s): [facility] [place] Location Reason for Treatment: Bipolar and Substance Use Does patient have an ACCT team?: No Does patient have Intensive In-House Services?  : No Does patient have [facility] services? : No Does patient have P4CC services?: No  ADL Screening (condition at time of admission) Patient's cognitive ability adequate to safely complete daily activities?: Yes Is the patient deaf or have difficulty hearing?: No Does the patient have difficulty seeing, even when wearing glasses/contacts?: No Does the patient have difficulty concentrating, remembering, or making decisions?: Yes Patient able to express need for assistance with ADLs?: Yes Does the patient have difficulty dressing or bathing?: No Independently performs ADLs?: Yes (appropriate for developmental age) Does the patient have difficulty walking or climbing stairs?: No Weakness of Legs: None Weakness of Arms/Hands: None  Home Assistive Devices/Equipment Home Assistive Devices/Equipment: None    Abuse/Neglect Assessment (Assessment to be complete while patient is alone) Physical Abuse: Denies Verbal Abuse: Denies Sexual Abuse: Denies Exploitation of patient/patient's resources: Denies Self-Neglect: Denies Values / Beliefs Cultural Requests During Hospitalization: None Spiritual Requests During Hospitalization: None Consults Spiritual Care Consult Needed: No Social Work Consult Needed: No Merchant navy officer (For Healthcare) Does Patient Have a Medical Advance Directive?: No Would patient like information on creating a medical advance directive?: No - Patient declined    Additional Information 1:1 In Past  12 Months?: No CIRT Risk: No Elopement Risk: No Does patient have medical clearance?: Yes     Disposition:  Pending psych disposition.  Disposition Initial Assessment Completed for this Encounter: Yes Disposition of Patient: Other dispositions  On Site Evaluation by:   Reviewed with Physician:    Phillip Heal LaVerne 11/01/2016 11:50 AM

## 2016-11-01 NOTE — ED Notes (Signed)
Pt given the phone - he reports that he is supposed to have an appointment at [facility] this am at 1000  - I attempted to look up the number for him without success   He is using the phone

## 2016-11-01 NOTE — ED Notes (Signed)
ED BHU PLACEMENT JUSTIFICATION Is the patient under IVC or is there intent for IVC:  no Is the patient medically cleared: Yes.   Is there vacancy in the ED BHU: Yes.   Is the population mix appropriate for patient: Yes.   Is the patient awaiting placement in inpatient or outpatient setting:  Has the patient had a psychiatric consult:  Consult pending  Survey of unit performed for contraband, proper placement and condition of furniture, tampering with fixtures in bathroom, shower, and each patient room: Yes.  ; Findings:  APPEARANCE/BEHAVIOR Calm and cooperative NEURO ASSESSMENT Orientation: oriented x4  Denies pain Hallucinations: No.None noted (Hallucinations) Speech: Normal Gait: normal RESPIRATORY ASSESSMENT even unlabored respirations  CARDIOVASCULAR ASSESSMENT Pulses equal  regular rate  Skin warm and dry GASTROINTESTINAL ASSESSMENT no GI complaint EXTREMITIES Full ROM PLAN OF CARE Provide calm/safe environment. Vital signs assessed twice daily. ED BHU Assessment once each 12-hour shift. Collaborate with TTS daily or as condition indicates. Assure the ED provider has rounded once each shift. Provide and encourage hygiene. Provide redirection as needed. Assess for escalating behavior; address immediately and inform ED provider.  Assess family dynamic and appropriateness for visitation as needed: Yes.  ; If necessary, describe findings:  Educate the patient/family about BHU procedures/visitation: Yes.  ; If necessary, describe findings:

## 2016-11-01 NOTE — ED Provider Notes (Signed)
The patient has been evaluated at bedside by Dr. Toni Amend, psychiatry.  Patient is clinically stable.  Not felt to be a danger to self or others.  No SI or Hi.  No indication for inpatient psychiatric admission at this time.  Appropriate for continued outpatient therapy.    Willy Eddy, MD 11/01/16 1537

## 2016-11-01 NOTE — ED Notes (Signed)
Pt arrived ambulatory via EMS; c/o feeling anxious; denies SI to EMS; pt calm and cooperative at stat registration

## 2016-11-01 NOTE — ED Notes (Signed)
BEHAVIORAL HEALTH ROUNDING Patient sleeping: Yes.   Patient alert and oriented: eyes closed  Appears to be asleep Behavior appropriate: Yes.  ; If no, describe:  Nutrition and fluids offered: Yes  Toileting and hygiene offered: sleeping Sitter present: q 15 minute observations and security monitoring Law enforcement present: yes  ODS 

## 2016-11-01 NOTE — ED Notes (Signed)
BEHAVIORAL HEALTH ROUNDING Patient sleeping: No. Patient alert and oriented: yes Behavior appropriate: Yes.  ; If no, describe:  Nutrition and fluids offered: yes Toileting and hygiene offered: Yes  Sitter present: q15 minute observations and security  monitoring Law enforcement present: Yes  ODS  

## 2016-11-01 NOTE — ED Notes (Signed)
BEHAVIORAL HEALTH ROUNDING Patient sleeping: No. Patient alert and oriented: yes Behavior appropriate: Yes.  ; If no, describe:  Nutrition and fluids offered: yes Toileting and hygiene offered: Yes  Sitter present: q15 minute observations and security monitoring Law enforcement present: Yes  ODS  Pt to be discharged - phone provided so that he may call for a ride

## 2016-11-01 NOTE — ED Notes (Signed)
TTS in with pt. 

## 2019-01-02 ENCOUNTER — Other Ambulatory Visit: Payer: Self-pay

## 2019-01-02 ENCOUNTER — Emergency Department
Admission: EM | Admit: 2019-01-02 | Discharge: 2019-01-03 | Disposition: A | Discharge status: Home / Self Care | Payer: Self-pay | Attending: Emergency Medicine | Admitting: Emergency Medicine

## 2019-01-02 ENCOUNTER — Emergency Department: Payer: Self-pay

## 2019-01-02 ENCOUNTER — Encounter: Payer: Self-pay | Admitting: Emergency Medicine

## 2019-01-02 DIAGNOSIS — L03113 Cellulitis of right upper limb: Secondary | ICD-10-CM | POA: Insufficient documentation

## 2019-01-02 DIAGNOSIS — L0291 Cutaneous abscess, unspecified: Secondary | ICD-10-CM

## 2019-01-02 DIAGNOSIS — F17210 Nicotine dependence, cigarettes, uncomplicated: Secondary | ICD-10-CM | POA: Insufficient documentation

## 2019-01-02 DIAGNOSIS — A549 Gonococcal infection, unspecified: Secondary | ICD-10-CM | POA: Insufficient documentation

## 2019-01-02 DIAGNOSIS — L02413 Cutaneous abscess of right upper limb: Secondary | ICD-10-CM | POA: Insufficient documentation

## 2019-01-02 DIAGNOSIS — Z79899 Other long term (current) drug therapy: Secondary | ICD-10-CM | POA: Insufficient documentation

## 2019-01-02 LAB — URINALYSIS, ROUTINE W REFLEX MICROSCOPIC
Bacteria, UA: NONE SEEN
Bilirubin Urine: NEGATIVE
Glucose, UA: NEGATIVE mg/dL
Hgb urine dipstick: NEGATIVE
Ketones, ur: NEGATIVE mg/dL
Nitrite: NEGATIVE
Protein, ur: NEGATIVE mg/dL
Specific Gravity, Urine: 1.03 (ref 1.005–1.030)
Squamous Epithelial / LPF: NONE SEEN (ref 0–5)
WBC, UA: >50 WBC/hpf — ABNORMAL HIGH (ref 0–5)
pH: 5 (ref 5.0–8.0)

## 2019-01-02 LAB — COMPREHENSIVE METABOLIC PANEL
ALT: 14 U/L (ref 0–44)
AST: 20 U/L (ref 15–41)
Albumin: 3.6 g/dL (ref 3.5–5.0)
Alkaline Phosphatase: 47 U/L (ref 38–126)
Anion gap: 8 (ref 5–15)
BUN: 14 mg/dL (ref 6–20)
CO2: 27 mmol/L (ref 22–32)
Calcium: 9 mg/dL (ref 8.9–10.3)
Chloride: 106 mmol/L (ref 98–111)
Creatinine, Ser: 1.46 mg/dL — ABNORMAL HIGH (ref 0.61–1.24)
GFR calc Af Amer: >60 mL/min (ref >60)
GFR calc non Af Amer: >60 mL/min (ref >60)
Glucose, Bld: 103 mg/dL — ABNORMAL HIGH (ref 70–99)
Potassium: 3.7 mmol/L (ref 3.5–5.1)
Sodium: 141 mmol/L (ref 135–145)
Total Bilirubin: 0.4 mg/dL (ref 0.3–1.2)
Total Protein: 6.4 g/dL — ABNORMAL LOW (ref 6.5–8.1)

## 2019-01-02 LAB — CBC WITH DIFFERENTIAL/PLATELET
Abs Immature Granulocytes: 0.03 10*3/uL (ref 0.00–0.07)
Basophils Absolute: 0 10*3/uL (ref 0.0–0.1)
Basophils Relative: 1 %
Eosinophils Absolute: 0.1 10*3/uL (ref 0.0–0.5)
Eosinophils Relative: 3 %
HCT: 40.5 % (ref 39.0–52.0)
Hemoglobin: 13.5 g/dL (ref 13.0–17.0)
Immature Granulocytes: 1 %
Lymphocytes Relative: 25 %
Lymphs Abs: 1.1 10*3/uL (ref 0.7–4.0)
MCH: 31.5 pg (ref 26.0–34.0)
MCHC: 33.3 g/dL (ref 30.0–36.0)
MCV: 94.6 fL (ref 80.0–100.0)
Monocytes Absolute: 0.6 10*3/uL (ref 0.1–1.0)
Monocytes Relative: 13 %
Neutro Abs: 2.7 10*3/uL (ref 1.7–7.7)
Neutrophils Relative %: 57 %
Platelets: 173 10*3/uL (ref 150–400)
RBC: 4.28 MIL/uL (ref 4.22–5.81)
RDW: 15 % (ref 11.5–15.5)
WBC: 4.6 10*3/uL (ref 4.0–10.5)
nRBC: 0 % (ref 0.0–0.2)

## 2019-01-02 LAB — CHLAMYDIA/NGC RT PCR (ARMC ONLY)
Chlamydia Tr: NOT DETECTED
N gonorrhoeae: DETECTED — AB

## 2019-01-02 LAB — LACTIC ACID, PLASMA: Lactic Acid, Venous: 1.2 mmol/L (ref 0.5–1.9)

## 2019-01-02 MED ORDER — METRONIDAZOLE 500 MG PO TABS
2000.0000 mg | ORAL_TABLET | Freq: Once | ORAL | Status: AC
  Administered 2019-01-02: 2000 mg via ORAL
  Filled 2019-01-02: qty 4

## 2019-01-02 MED ORDER — CEFTRIAXONE SODIUM 1 G IJ SOLR
1.0000 g | Freq: Once | INTRAMUSCULAR | Status: AC
  Administered 2019-01-02: 1 g via INTRAVENOUS
  Filled 2019-01-02: qty 10

## 2019-01-02 MED ORDER — SODIUM CHLORIDE 0.9 % IV SOLN
500.0000 mg | Freq: Once | INTRAVENOUS | Status: AC
  Administered 2019-01-02: 500 mg via INTRAVENOUS
  Filled 2019-01-02: qty 500

## 2019-01-02 MED ORDER — SULFAMETHOXAZOLE-TRIMETHOPRIM 800-160 MG PO TABS: 1.0000 | ORAL_TABLET | Freq: Two times a day (BID) | ORAL | 0 refills | Status: AC

## 2019-01-02 MED ORDER — CEPHALEXIN 500 MG PO CAPS: 500.0000 mg | ORAL_CAPSULE | Freq: Three times a day (TID) | ORAL | 0 refills | Status: AC

## 2019-01-02 NOTE — ED Triage Notes (Signed)
Pt arrived via POV with reports of penile discharge and reports he was exposed to STD.

## 2019-01-02 NOTE — ED Provider Notes (Signed)
[facility] Emergency Department Provider Note  ____________________________________________  Time seen: Approximately 7:58 PM  I have reviewed the triage vital signs and the nursing notes.   HISTORY  Chief Complaint Exposure to STD    HPI Dustin Bennett is a 33 y.o. male with a history of HIV, presents to the emergency department with penile discharge and dysuria for the past 2 days.  Patient reports that he has had multiple STDs in the past and his current symptoms feel similar.  Patient secondarily conveys concern for edema and induration [facility] right [facility]  since patient has been injecting methamphetamines.  Patient has had low-grade fever at home.        Past Medical History:  Diagnosis Date  . Bipolar 1 disorder (HCC)   . Depression   . HIV (human immunodeficiency virus infection) [facility](HCC)     Patient Active Problem List   Diagnosis Date Noted  . Substance induced mood disorder (HCC) 11/01/2016  . Tobacco use disorder 12/09/2015  . 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 Cannabis use disorder, severe, dependence (HCC) 07/25/2015  . 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 Cocaine use disorder, severe, dependence (HCC) 07/25/2015  . HIV positive (HCC) 07/25/2015    Past Surgical History:  Procedure Laterality Date  . LUNG BIOPSY      Prior to Admission medications   Medication Sig Start Date End Date Taking? Authorizing Provider  cephALEXin (KEFLEX) 500 MG capsule Take 1 capsule (500 mg total) by mouth 3 (three) times daily for 7 days. 01/02/19 01/09/19  Orvil FeilWoods, Shaheem Pichon M, PA-C  QUEtiapine (SEROQUEL) 300 MG tablet Take 1 tablet (300 mg total) by mouth at bedtime. 11/01/16   Clapacs, Jackquline DenmarkJohn T, MD  sulfamethoxazole-trimethoprim (BACTRIM DS) 800-160 MG tablet Take 1 tablet by mouth 2 (two) times daily for 7 days. 01/02/19 01/09/19  Pia MauWoods, Sakeenah Valcarcel M, PA-C  TRIUMEQ 600-50-300 MG tablet Take 1 tablet by mouth daily. 09/28/16    [provider]    Allergies Patient has no known allergies.  No family history on file.  Social History Social History   Tobacco Use  . Smoking status: Current Every Day Smoker    Packs/day: 1.00    Types: Cigarettes  . Smokeless tobacco: Never Used  Substance Use Topics  . Alcohol use: No    Alcohol/week: 6.0 standard drinks    Types: 6 Cans of beer per week  . Drug use: Yes    Types: Marijuana, IV, Methamphetamines     Review of Systems  Constitutional: No fever/chills Eyes: No visual changes. No discharge ENT: No upper respiratory complaints. Cardiovascular: no chest pain. Respiratory: no cough. No SOB. Gastrointestinal: No abdominal pain.  No nausea, no vomiting.  No diarrhea.  No constipation. Genitourinary: Patient has penile discharge and dysuria.  Musculoskeletal: Patient has induration and edema at right elbow. Skin: Negative for rash, abrasions, lacerations, ecchymosis. Neurological: Negative for headaches, focal weakness or numbness.   ____________________________________________   PHYSICAL EXAM:  VITAL SIGNS: ED Triage Vitals  Enc Vitals Group     BP 01/02/19 1910 (!) 133/101     Pulse Rate 01/02/19 1910 87     Resp 01/02/19 1910 18     Temp 01/02/19 1909 99.1 F (37.3 C)     Temp Source 01/02/19 1909 Oral     SpO2 01/02/19 1910 96 %     Weight 01/02/19 1910 170 lb (77.1 kg)     Height 01/02/19 1910 5\' 11"  (1.803 m)  Head Circumference --      Peak Flow --      Pain Score 01/02/19 1910 0     Pain Loc --      Pain Edu? --      Excl. in [place]? --      Constitutional: Alert and oriented. Well appearing and in no acute distress. Eyes: Conjunctivae are normal. PERRL. EOMI. Head: Atraumatic. Cardiovascular: Normal rate, regular rhythm. Normal S1 and S2.  Good peripheral circulation. Respiratory: Normal respiratory effort without tachypnea or retractions. Lungs CTAB. Good air entry to the bases with no decreased or absent breath  sounds. Gastrointestinal: Bowel sounds 4 quadrants. Soft and nontender to palpation. No guarding or rigidity. No palpable masses. No distention. No CVA tenderness. Musculoskeletal: Full range of motion to all extremities. No gross deformities appreciated. Neurologic:  Normal speech and language. No gross focal neurologic deficits are appreciated.  Skin: Patient has edema and induration at right [facility] from recent injection site.  No surrounding cellulitis. Psychiatric: Mood and affect are normal. Speech and behavior are normal. Patient exhibits appropriate insight and judgement.   ____________________________________________   LABS (all labs ordered are listed, but only abnormal results are displayed)  Labs Reviewed  CHLAMYDIA/NGC RT PCR ([facility] ONLY) - Abnormal; Notable for the following components:      Result Value   N gonorrhoeae DETECTED (*)    All other components within normal limits  URINALYSIS, ROUTINE W REFLEX MICROSCOPIC - Abnormal; Notable for the following components:   Color, Urine YELLOW (*)    APPearance CLOUDY (*)    Leukocytes,Ua MODERATE (*)    WBC, UA >50 (*)    All other components within normal limits  COMPREHENSIVE METABOLIC PANEL - Abnormal; Notable for the following components:   Glucose, Bld 103 (*)    Creatinine, Ser 1.46 (*)    Total Protein 6.4 (*)    All other components within normal limits  CULTURE, BLOOD (ROUTINE X 2)  CULTURE, BLOOD (ROUTINE X 2)  CBC WITH DIFFERENTIAL/PLATELET  LACTIC ACID, PLASMA  LACTIC ACID, PLASMA   ____________________________________________  EKG   ____________________________________________  RADIOLOGY   [place] Rt Upper Extrem Ltd Soft Tissue Non Vascular  Result Date: 01/02/2019 CLINICAL DATA:  Concern for upper extremity abscess. EXAM: ULTRASOUND right UPPER EXTREMITY LIMITED TECHNIQUE: Ultrasound examination of the upper extremity soft tissues was performed in the area of clinical concern. COMPARISON:  None  FINDINGS: There is an indistinct heterogeneous area in the patient's palpable area of concern measuring approximately 3.3 x 1.5 x 1.4 cm. Within this location, there is edema and overlying skin thickening. There is hyperemia. The adjacent cephalic vein appears patent. There is no well-formed fluid collection. IMPRESSION: 1. No drainable abscess identified on today's exam. 2. Indistinct heterogeneous area measuring approximately 3.3 cm in the patient's palpable area of concern has the sonographic appearance of a phlegmon or soft tissue infection. Again, no well-formed drainable fluid collection identified at this time. Electronically Signed   By: Constance Holster M.D.   On: 01/02/2019 20:49    ____________________________________________    PROCEDURES  Procedure(s) performed:    Procedures    Medications  azithromycin (ZITHROMAX) 500 mg in sodium chloride 0.9 % 250 mL IVPB (has no administration in time range)  cefTRIAXone (ROCEPHIN) 1 g in sodium chloride 0.9 % 100 mL IVPB (1 g Intravenous New Bag/Given 01/02/19 2150)  metroNIDAZOLE (FLAGYL) tablet 2,000 mg (2,000 mg Oral Given 01/02/19 2149)     ____________________________________________   INITIAL IMPRESSION /  ASSESSMENT AND PLAN / ED COURSE  Pertinent labs & imaging results that were available during my care of the patient were reviewed by me and considered in my medical decision making (see chart for details).  Review of the [place] CSRS was performed in accordance of the [facility] prior to dispensing any controlled drugs.  Clinical Course as of Jan 01 2242  Tue Jan 02, 2019  1953 Color, Urine(!): YELLOW [JW]    Clinical Course User Index [JW] Orvil FeilWoods, Johneisha Broaden M, PA-C        Assessment and Plan: STD Cellulitis 33 year old male presents to the emergency department with penile discharge and dysuria for the past 2 days.  Patient also reported that he recently injected methamphetamine and had some hardness and swelling around right  forearm.  On physical exam, patient had low-grade fever.  No tachycardia or tachypnea.  Patient [facility].  Differential diagnosis included right upper extremity cellulitis, abscess and STD.Marland Kitchen.  Patient had right upper extremity ultrasound in the emergency department which revealed no drainable fluid collection.  Patient tested positive for gonorrhea in the emergency department.  Patient was given IV Rocephin, azithromycin and Flagyl in the emergency department.  He was discharged with Bactrim and Keflex.  Patient's case was reviewed with attending Dr. Scotty CourtStafford who agreed with plan of care.  Patient does not currently meet admission criteria.  Strict return precautions were given to return to the emergency department for new or worsening symptoms.  All patient questions were answered.    ____________________________________________  FINAL CLINICAL IMPRESSION(S) / ED DIAGNOSES  Final diagnoses:  Abscess  Gonorrhea  Cellulitis of right upper extremity      NEW MEDICATIONS STARTED DURING THIS VISIT:  ED Discharge Orders         Ordered    sulfamethoxazole-trimethoprim (BACTRIM DS) 800-160 MG tablet  2 times daily     01/02/19 2218    cephALEXin (KEFLEX) 500 MG capsule  3 times daily     01/02/19 2218              This chart was dictated using voice recognition software/Dragon. Despite best efforts to proofread, errors can occur which can change the meaning. Any change was purely unintentional.    Orvil FeilWoods, Keily Lepp M, PA-C 01/02/19 2244    Sharyn CreamerQuale, Mark, MD 01/02/19 [phone]

## 2019-01-07 LAB — CULTURE, BLOOD (ROUTINE X 2)
Culture: NO GROWTH
Culture: NO GROWTH
Special Requests: ADEQUATE
Special Requests: ADEQUATE

## 2019-02-06 ENCOUNTER — Other Ambulatory Visit: Payer: Self-pay

## 2019-02-06 ENCOUNTER — Emergency Department
Admission: EM | Admit: 2019-02-06 | Discharge: 2019-02-06 | Disposition: A | Discharge status: Home / Self Care | Payer: Self-pay | Attending: Emergency Medicine | Admitting: Emergency Medicine

## 2019-02-06 DIAGNOSIS — R21 Rash and other nonspecific skin eruption: Secondary | ICD-10-CM | POA: Insufficient documentation

## 2019-02-06 DIAGNOSIS — Z21 Asymptomatic human immunodeficiency virus [HIV] infection status: Secondary | ICD-10-CM | POA: Insufficient documentation

## 2019-02-06 DIAGNOSIS — Z79899 Other long term (current) drug therapy: Secondary | ICD-10-CM | POA: Insufficient documentation

## 2019-02-06 DIAGNOSIS — F17210 Nicotine dependence, cigarettes, uncomplicated: Secondary | ICD-10-CM | POA: Insufficient documentation

## 2019-02-06 DIAGNOSIS — W57XXXA Bitten or stung by nonvenomous insect and other nonvenomous arthropods, initial encounter: Secondary | ICD-10-CM | POA: Insufficient documentation

## 2019-02-06 MED ORDER — DIPHENHYDRAMINE-ZINC ACETATE 2-0.1 % EX CREA: TOPICAL_CREAM | Freq: Three times a day (TID) | CUTANEOUS | Status: DC | PRN

## 2019-02-06 MED ORDER — ZINC OXIDE 40 % EX OINT
TOPICAL_OINTMENT | Freq: Three times a day (TID) | CUTANEOUS | Status: DC | PRN
  Filled 2019-02-06: qty 113

## 2019-02-06 MED ORDER — METHYLPREDNISOLONE 4 MG PO TBPK: ORAL_TABLET | ORAL | 0 refills | Status: DC

## 2019-02-06 NOTE — ED Notes (Signed)
ED Provider at bedside. 

## 2019-02-06 NOTE — ED Triage Notes (Signed)
Pt to the ER for bilateral ankle and feet rash. Pt says he has been in [place] for a month and the rash started the day he moved here. Pt says he has moved in with his mother. Rash appears as bites, possibly flea bites. There is carpet and animals in the house. Some areas are open where pt has scratched. Pt states he has tried multiple creams and OTC remedies with no improvement.

## 2019-02-06 NOTE — Discharge Instructions (Signed)
1.  You may apply Benadryl cream 3 times daily to affected areas. 2.  Take steroid taper as prescribed (Medrol Dosepak). 3.  Return to the ER for worsening symptoms, persistent vomiting, difficulty breathing or other concerns.

## 2019-02-06 NOTE — ED Notes (Signed)
Patient complaining of some type of bites on both legs with some areas open from scratching. Patient states he recently moved from [place] to [place] about a month ago and within the last three weeks itchy bumps on legs appeared.

## 2019-02-06 NOTE — ED Provider Notes (Signed)
[facility] Emergency Department Provider Note   ____________________________________________   First MD Initiated Contact with Patient 02/06/19 0540     (approximate)  I have reviewed the triage vital signs and the nursing notes.   HISTORY  Chief Complaint Rash    HPI Dustin Bennett is a 33 y.o. male who presents to the ED from home with a chief complaint of rash.  Patient recently moved to [place] from [place] and he noted the rash when he first moved in with his mother a month ago.  His mother does have a dog.  There is concern for fleas.  Presents with insect bites to bilateral lower extremities which are itchy.  Has tried over-the-counter cortisone cream without relief of symptoms.  Voices no other complaints.  Denies fever, cough, chest pain, shortness of breath, abdominal pain, nausea or vomiting.       Past Medical History:  Diagnosis Date  . Bipolar 1 disorder ([place])   . Depression   . HIV (human immunodeficiency virus infection) [facility])     Patient Active Problem List   Diagnosis Date Noted  . Substance induced mood disorder ([place]) 11/01/2016  . Tobacco use disorder 12/09/2015  . 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 Cannabis use disorder, severe, dependence ([place]) 07/25/2015  . 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 Cocaine use disorder, severe, dependence ([place]) 07/25/2015  . HIV positive ([place]) 07/25/2015    Past Surgical History:  Procedure Laterality Date  . LUNG BIOPSY      Prior to Admission medications   Medication Sig Start Date End Date Taking? Authorizing Provider  methylPREDNISolone (MEDROL DOSEPAK) 4 MG TBPK tablet Take as directed 02/06/19   Paulette Blanch, MD  QUEtiapine (SEROQUEL) 300 MG tablet Take 1 tablet (300 mg total) by mouth at bedtime. 11/01/16   Clapacs, Madie Reno, MD  TRIUMEQ 600-50-300 MG tablet Take 1 tablet by mouth daily. 09/28/16   [provider]    Allergies  Patient has no known allergies.  No family history on file.  Social History Social History   Tobacco Use  . Smoking status: Current Every Day Smoker    Packs/day: 1.00    Types: Cigarettes  . Smokeless tobacco: Never Used  Substance Use Topics  . Alcohol use: No    Alcohol/week: 6.0 standard drinks    Types: 6 Cans of beer per week  . Drug use: Yes    Types: Marijuana    Review of Systems  Constitutional: No fever/chills Eyes: No visual changes. ENT: No sore throat. Cardiovascular: Denies chest pain. Respiratory: Denies shortness of breath. Gastrointestinal: No abdominal pain.  No nausea, no vomiting.  No diarrhea.  No constipation. Genitourinary: Negative for dysuria. Musculoskeletal: Negative for back pain. Skin: Positive for rash. Neurological: Negative for headaches, focal weakness or numbness.   ____________________________________________   PHYSICAL EXAM:  VITAL SIGNS: ED Triage Vitals  Enc Vitals Group     BP 02/06/19 0253 96/73     Pulse Rate 02/06/19 0253 64     Resp 02/06/19 0253 18     Temp 02/06/19 0253 98.5 F (36.9 C)     Temp Source 02/06/19 0253 Oral     SpO2 02/06/19 0253 97 %     Weight 02/06/19 0256 180 lb (81.6 kg)     Height 02/06/19 0256 5\' 11"  (1.803 m)     Head Circumference --      Peak Flow --  Pain Score 02/06/19 0256 0     Pain Loc --      Pain Edu? --      Excl. in GC? --     Constitutional: Alert and oriented. Well appearing and in no acute distress. Eyes: Conjunctivae are normal. PERRL. EOMI. Head: Atraumatic. Nose: No congestion/rhinnorhea. Mouth/Throat: Mucous membranes are moist.  Oropharynx non-erythematous. Neck: No stridor.   Cardiovascular: Normal rate, regular rhythm. Grossly normal heart sounds.  Good peripheral circulation. Respiratory: Normal respiratory effort.  No retractions. Lungs CTAB. Gastrointestinal: Soft and nontender. No distention. No abdominal bruits. No CVA tenderness. Musculoskeletal:   Clusters of small red bumps to bilateral feet and slightly above ankles.  Several areas of excoriation without evidence of active infection. Neurologic:  Normal speech and language. No gross focal neurologic deficits are appreciated. No gait instability. Skin:  Skin is warm, dry and intact. No rash noted. Psychiatric: Mood and affect are normal. Speech and behavior are normal.  ____________________________________________   LABS (all labs ordered are listed, but only abnormal results are displayed)  Labs Reviewed - No data to display ____________________________________________  EKG  None ____________________________________________  RADIOLOGY  ED MD interpretation: None  Official radiology report(s): No results found.  ____________________________________________   PROCEDURES  Procedure(s) performed (including Critical Care):  Procedures   ____________________________________________   INITIAL IMPRESSION / ASSESSMENT AND PLAN / ED COURSE  As part of my medical decision making, I reviewed the following data within the electronic MEDICAL RECORD NUMBER Nursing notes reviewed and incorporated, Old chart reviewed and Notes from prior ED visits     [name] was evaluated in Emergency Department on 02/06/2019 for the symptoms described in the history of present illness. He was evaluated in the context of the global COVID-19 pandemic, which necessitated consideration that the patient might be at risk for infection with the SARS-CoV-2 virus that causes COVID-19. Institutional protocols and algorithms that pertain to the evaluation of patients at risk for COVID-19 are in a state of rapid change based on information released by regulatory bodies including the CDC and federal and state organizations. These policies and algorithms were followed during the patient's care in the ED.   33 year old male who presents with a one-month history of rash to bilateral feet and ankles  concerning for fleabites.  Will apply Benadryl cream.  Discharge home on Medrol Dosepak.  Strict return precautions given.  Patient verbalizes understanding agrees with plan of care.      ____________________________________________   FINAL CLINICAL IMPRESSION(S) / ED DIAGNOSES  Final diagnoses:  Rash  Flea bite, initial encounter     ED Discharge Orders         Ordered    methylPREDNISolone (MEDROL DOSEPAK) 4 MG TBPK tablet     02/06/19 0546           Note:  This document was prepared using Dragon voice recognition software and may include unintentional dictation errors.   Irean HongSung, Shaylin Blatt J, MD 02/06/19 [phone]

## 2019-02-15 ENCOUNTER — Encounter: Payer: Self-pay | Admitting: Emergency Medicine

## 2019-02-15 ENCOUNTER — Emergency Department
Admission: EM | Admit: 2019-02-15 | Discharge: 2019-02-16 | Disposition: A | Discharge status: Home / Self Care | Payer: Self-pay | Attending: Emergency Medicine | Admitting: Emergency Medicine

## 2019-02-15 ENCOUNTER — Telehealth (HOSPITAL_COMMUNITY): Payer: Self-pay

## 2019-02-15 ENCOUNTER — Other Ambulatory Visit: Payer: Self-pay

## 2019-02-15 DIAGNOSIS — Z202 Contact with and (suspected) exposure to infections with a predominantly sexual mode of transmission: Secondary | ICD-10-CM | POA: Insufficient documentation

## 2019-02-15 DIAGNOSIS — R21 Rash and other nonspecific skin eruption: Secondary | ICD-10-CM | POA: Insufficient documentation

## 2019-02-15 DIAGNOSIS — F17210 Nicotine dependence, cigarettes, uncomplicated: Secondary | ICD-10-CM | POA: Insufficient documentation

## 2019-02-15 DIAGNOSIS — Z711 Person with feared health complaint in whom no diagnosis is made: Secondary | ICD-10-CM

## 2019-02-15 DIAGNOSIS — Z79899 Other long term (current) drug therapy: Secondary | ICD-10-CM | POA: Insufficient documentation

## 2019-02-15 DIAGNOSIS — Z21 Asymptomatic human immunodeficiency virus [HIV] infection status: Secondary | ICD-10-CM | POA: Insufficient documentation

## 2019-02-15 LAB — URINALYSIS, COMPLETE (UACMP) WITH MICROSCOPIC
Bacteria, UA: NONE SEEN
Bilirubin Urine: NEGATIVE
Glucose, UA: NEGATIVE mg/dL
Hgb urine dipstick: NEGATIVE
Ketones, ur: NEGATIVE mg/dL
Leukocytes,Ua: NEGATIVE
Nitrite: NEGATIVE
Protein, ur: NEGATIVE mg/dL
Specific Gravity, Urine: 1.013 (ref 1.005–1.030)
Squamous Epithelial / LPF: NONE SEEN (ref 0–5)
pH: 6 (ref 5.0–8.0)

## 2019-02-15 MED ORDER — ONDANSETRON 4 MG PO TBDP
4.0000 mg | ORAL_TABLET | Freq: Once | ORAL | Status: AC
Start: 2019-02-15 — End: 2019-02-15
  Administered 2019-02-15: 4 mg via ORAL
  Filled 2019-02-15: qty 1

## 2019-02-15 MED ORDER — AZITHROMYCIN 500 MG PO TABS
1000.0000 mg | ORAL_TABLET | Freq: Once | ORAL | Status: AC
  Administered 2019-02-15: 1000 mg via ORAL
  Filled 2019-02-15: qty 2

## 2019-02-15 MED ORDER — METRONIDAZOLE 500 MG PO TABS
2000.0000 mg | ORAL_TABLET | Freq: Once | ORAL | Status: AC
  Administered 2019-02-15: 2000 mg via ORAL
  Filled 2019-02-15: qty 4

## 2019-02-15 MED ORDER — LIDOCAINE HCL (PF) 1 % IJ SOLN
INTRAMUSCULAR | Status: AC
  Filled 2019-02-15: qty 5

## 2019-02-15 MED ORDER — CEFTRIAXONE SODIUM 250 MG IJ SOLR
250.0000 mg | Freq: Once | INTRAMUSCULAR | Status: AC
  Administered 2019-02-16: 250 mg via INTRAMUSCULAR
  Filled 2019-02-15: qty 250

## 2019-02-15 NOTE — ED Notes (Signed)
Supplied patient with urine cup for sample

## 2019-02-15 NOTE — ED Triage Notes (Signed)
Pt in via POV, reports needing new STD check due to new partner.  Pt denies any new symptoms.  Does complain of rash to BLE.  Ambulatory to triage, NAD noted at this time.

## 2019-02-15 NOTE — ED Provider Notes (Signed)
[facility] Emergency Department Provider Note   ____________________________________________   First MD Initiated Contact with Patient 02/15/19 2314     (approximate)  I have reviewed the triage vital signs and the nursing notes.   HISTORY  Chief Complaint SEXUALLY TRANSMITTED DISEASE    HPI Dustin Bennett is a 33 y.o. male who presents to the ED from home with a chief complaint of STD concern. Patient is HIV+ who states he recently changed sexual partners. He was seen in the ED on 6/9, GC+ and treated empirically. Patient claims he was not aware of the results nor treatment (I confirmed this and showed him his results and medications given in the records). Also he is still itching from presumed flea bites to his bilateral feet and legs. He was seen on 7/14 for same rash and discharged on Medrol dose pack. States overall the rash has improved but it still itches. Complains of 1 day history of dysuria. Denies fever, cough, chest pain, sob, abdominal pain, nausea or vomiting. Denies uretheral discharge or scrotal swelling.       Past Medical History:  Diagnosis Date  . Bipolar 1 disorder ([place])   . Depression   . HIV (human immunodeficiency virus infection) [facility])     Patient Active Problem List   Diagnosis Date Noted  . Substance induced mood disorder ([place]) 11/01/2016  . Tobacco use disorder 12/09/2015  . 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 Cannabis use disorder, severe, dependence ([place]) 07/25/2015  . 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 Cocaine use disorder, severe, dependence ([place]) 07/25/2015  . HIV positive ([place]) 07/25/2015    Past Surgical History:  Procedure Laterality Date  . LUNG BIOPSY      Prior to Admission medications   Medication Sig Start Date End Date Taking? Authorizing Provider  methylPREDNISolone (MEDROL DOSEPAK) 4 MG TBPK tablet Take as directed 02/06/19   Paulette Blanch, MD  QUEtiapine  (SEROQUEL) 300 MG tablet Take 1 tablet (300 mg total) by mouth at bedtime. 11/01/16   Clapacs, Madie Reno, MD  TRIUMEQ 600-50-300 MG tablet Take 1 tablet by mouth daily. 09/28/16   [provider]    Allergies Patient has no known allergies.  No family history on file.  Social History Social History   Tobacco Use  . Smoking status: Current Every Day Smoker    Packs/day: 1.00    Types: Cigarettes  . Smokeless tobacco: Never Used  Substance Use Topics  . Alcohol use: Yes  . Drug use: Yes    Types: Marijuana    Review of Systems  Constitutional: No fever/chills Eyes: No visual changes. ENT: No sore throat. Cardiovascular: Denies chest pain. Respiratory: Denies shortness of breath. Gastrointestinal: No abdominal pain.  No nausea, no vomiting.  No diarrhea.  No constipation. Genitourinary: Positive for dysuria. Musculoskeletal: Negative for back pain. Skin: Positive for rash. Neurological: Negative for headaches, focal weakness or numbness.  ____________________________________________   PHYSICAL EXAM:  VITAL SIGNS: ED Triage Vitals  Enc Vitals Group     BP 02/15/19 2034 118/87     Pulse Rate 02/15/19 2034 (!) 108     Resp 02/15/19 2034 16     Temp 02/15/19 2034 99.2 F (37.3 C)     Temp Source 02/15/19 2034 Oral     SpO2 02/15/19 2034 97 %     Weight 02/15/19 2035 175 lb (79.4 kg)     Height 02/15/19 2035 5\' 11"  (1.803 m)  Head Circumference --      Peak Flow --      Pain Score 02/15/19 2035 0     Pain Loc --      Pain Edu? --      Excl. in GC? --     Constitutional: Alert and oriented. Well appearing and in no acute distress. Eyes: Conjunctivae are normal. PERRL. EOMI. Head: Atraumatic. Nose: No congestion/rhinnorhea. Mouth/Throat: Mucous membranes are moist.  Oropharynx non-erythematous. Neck: No stridor.  . Cardiovascular: Normal rate, regular rhythm. Grossly normal heart sounds.  Good peripheral circulation. Respiratory: Normal respiratory  effort.  No retractions. Lungs CTAB. Gastrointestinal: Soft and nontender. No distention. No abdominal bruits. No CVA tenderness. Genitourinary: No urethral discharge. Unremarkable exam. Musculoskeletal: No lower extremity tenderness nor edema.  No joint effusions. Neurologic:  Normal speech and language. No gross focal neurologic deficits are appreciated. No gait instability. Skin:  Skin is warm, dry and intact. Scattered scabbed areas over bilateral feet and lower legs. No active maculopapular lesions. No petechiae. No vesicles. Psychiatric: Mood and affect are normal. Speech and behavior are normal.  ____________________________________________   LABS (all labs ordered are listed, but only abnormal results are displayed)  Labs Reviewed  URINALYSIS, COMPLETE (UACMP) WITH MICROSCOPIC - Abnormal; Notable for the following components:      Result Value   Color, Urine YELLOW (*)    APPearance CLEAR (*)    All other components within normal limits  CHLAMYDIA/NGC RT PCR ([facility] ONLY)   ____________________________________________  EKG  None ____________________________________________  RADIOLOGY  ED MD interpretation:  None  Official radiology report(s): No results found.  ____________________________________________   PROCEDURES  Procedure(s) performed (including Critical Care):  Procedures   ____________________________________________   INITIAL IMPRESSION / ASSESSMENT AND PLAN / ED COURSE  As part of my medical decision making, I reviewed the following data within the electronic MEDICAL RECORD NUMBER Nursing notes reviewed and incorporated, Old chart reviewed and Notes from prior ED visits     Dustin Bennett was evaluated in Emergency Department on 02/16/2019 for the symptoms described in the history of present illness. He was evaluated in the context of the global COVID-19 pandemic, which necessitated consideration that the patient might be at risk for  infection with the SARS-CoV-2 virus that causes COVID-19. Institutional protocols and algorithms that pertain to the evaluation of patients at risk for COVID-19 are in a state of rapid change based on information released by regulatory bodies including the CDC and federal and state organizations. These policies and algorithms were followed during the patient's care in the ED.   33 year old male who presents for STD concern. Does not wish to wait for DNA panel results. Will treat empirically, especially given he was GC+ just a month ago. This is patient's third visit for presumed flea bites to his feet and lower legs. Generally improved after Medrol dose pack. Will refer to dermatology for further evaluation/possible biopsy. Strict return precautions given. Patient verbalizes understanding and agrees with plan of care.  Clinical Course as of Feb 16 327  Fri Feb 16, 2019  0327 Chart review: DNA noted to be negative.   [JS]    Clinical Course User Index [JS] Irean HongSung, Derotha Fishbaugh J, MD     ____________________________________________   FINAL CLINICAL IMPRESSION(S) / ED DIAGNOSES  Final diagnoses:  Concern about STD in male without diagnosis  Rash     ED Discharge Orders    None       Note:  This document  was prepared using Conservation officer, historic buildingsDragon voice recognition software and may include unintentional dictation errors.   Irean HongSung, Yutaka Holberg J, MD 02/16/19 [phone]

## 2019-02-15 NOTE — Telephone Encounter (Signed)
Pt.  Called and reported that he received a Vm from a  Nurse from his visit in June.  When I reviewed his chart, pt went to [facility] for care.  I advised pt.to call [place] ED and to  Speak to someone there.  He verbalized understanding and will call. No further questions.

## 2019-02-16 LAB — CHLAMYDIA/NGC RT PCR (ARMC ONLY)??????????
Chlamydia Tr: NOT DETECTED
N gonorrhoeae: NOT DETECTED

## 2019-02-16 LAB — CHLAMYDIA/NGC RT PCR (ARMC ONLY)

## 2019-02-16 NOTE — Discharge Instructions (Signed)
You have treated empirically for STDs. You will be notified of any positive result. If you are positive, please notify your sexual partner to [facility]. Please follow up with the dermatologist to further evaluate the rash on your legs. Return to the ER for worsening symptoms, persistent vomiting, difficulty breathing or other concerns.

## 2019-07-24 ENCOUNTER — Other Ambulatory Visit: Payer: Self-pay

## 2019-07-24 ENCOUNTER — Emergency Department
Admission: EM | Admit: 2019-07-24 | Discharge: 2019-07-25 | Disposition: A | Discharge status: Home / Self Care | Payer: Self-pay | Attending: Emergency Medicine | Admitting: Emergency Medicine

## 2019-07-24 DIAGNOSIS — F332 Major depressive disorder, recurrent severe without psychotic features: Secondary | ICD-10-CM | POA: Diagnosis present

## 2019-07-24 DIAGNOSIS — Z79899 Other long term (current) drug therapy: Secondary | ICD-10-CM | POA: Insufficient documentation

## 2019-07-24 DIAGNOSIS — F1510 Other stimulant abuse, uncomplicated: Secondary | ICD-10-CM | POA: Diagnosis present

## 2019-07-24 DIAGNOSIS — F1994 Other psychoactive substance use, unspecified with psychoactive substance-induced mood disorder: Secondary | ICD-10-CM | POA: Diagnosis present

## 2019-07-24 DIAGNOSIS — F17200 Nicotine dependence, unspecified, uncomplicated: Secondary | ICD-10-CM | POA: Diagnosis present

## 2019-07-24 DIAGNOSIS — F32A Depression, unspecified: Secondary | ICD-10-CM

## 2019-07-24 DIAGNOSIS — F329 Major depressive disorder, single episode, unspecified: Secondary | ICD-10-CM

## 2019-07-24 DIAGNOSIS — Z20828 Contact with and (suspected) exposure to other viral communicable diseases: Secondary | ICD-10-CM | POA: Insufficient documentation

## 2019-07-24 DIAGNOSIS — F1910 Other psychoactive substance abuse, uncomplicated: Secondary | ICD-10-CM

## 2019-07-24 DIAGNOSIS — F17210 Nicotine dependence, cigarettes, uncomplicated: Secondary | ICD-10-CM | POA: Insufficient documentation

## 2019-07-24 DIAGNOSIS — R45851 Suicidal ideations: Secondary | ICD-10-CM

## 2019-07-24 DIAGNOSIS — F19951 Other psychoactive substance use, unspecified with psychoactive substance-induced psychotic disorder with hallucinations: Secondary | ICD-10-CM | POA: Diagnosis present

## 2019-07-24 DIAGNOSIS — F12288 Cannabis dependence with other cannabis-induced disorder: Secondary | ICD-10-CM | POA: Insufficient documentation

## 2019-07-24 DIAGNOSIS — F1220 Cannabis dependence, uncomplicated: Secondary | ICD-10-CM | POA: Diagnosis present

## 2019-07-24 DIAGNOSIS — Z21 Asymptomatic human immunodeficiency virus [HIV] infection status: Secondary | ICD-10-CM | POA: Diagnosis present

## 2019-07-24 DIAGNOSIS — F1420 Cocaine dependence, uncomplicated: Secondary | ICD-10-CM | POA: Diagnosis present

## 2019-07-24 DIAGNOSIS — F19151 Other psychoactive substance abuse with psychoactive substance-induced psychotic disorder with hallucinations: Secondary | ICD-10-CM | POA: Insufficient documentation

## 2019-07-24 LAB — CBC
HCT: 42.9 % (ref 39.0–52.0)
Hemoglobin: 15.1 g/dL (ref 13.0–17.0)
MCH: 29.8 pg (ref 26.0–34.0)
MCHC: 35.2 g/dL (ref 30.0–36.0)
MCV: 84.8 fL (ref 80.0–100.0)
Platelets: 219 K/uL (ref 150–400)
RBC: 5.06 MIL/uL (ref 4.22–5.81)
RDW: 13.9 % (ref 11.5–15.5)
WBC: 4.7 K/uL (ref 4.0–10.5)
nRBC: 0 % (ref 0.0–0.2)

## 2019-07-24 LAB — URINE DRUG SCREEN, QUALITATIVE (ARMC ONLY)
Amphetamines, Ur Screen: NOT DETECTED
Barbiturates, Ur Screen: NOT DETECTED
Benzodiazepine, Ur Scrn: NOT DETECTED
Cannabinoid 50 Ng, Ur ~~LOC~~: NOT DETECTED
Cocaine Metabolite,Ur ~~LOC~~: POSITIVE — AB
MDMA (Ecstasy)Ur Screen: NOT DETECTED
Methadone Scn, Ur: NOT DETECTED
Opiate, Ur Screen: NOT DETECTED
Phencyclidine (PCP) Ur S: NOT DETECTED
Tricyclic, Ur Screen: NOT DETECTED

## 2019-07-24 LAB — SALICYLATE LEVEL: Salicylate Lvl: <7 mg/dL — ABNORMAL LOW (ref 7.0–30.0)

## 2019-07-24 LAB — COMPREHENSIVE METABOLIC PANEL WITH GFR
ALT: 24 U/L (ref 0–44)
AST: 41 U/L (ref 15–41)
Albumin: 4.1 g/dL (ref 3.5–5.0)
Alkaline Phosphatase: 63 U/L (ref 38–126)
Anion gap: 10 (ref 5–15)
BUN: 11 mg/dL (ref 6–20)
CO2: 24 mmol/L (ref 22–32)
Calcium: 9.5 mg/dL (ref 8.9–10.3)
Chloride: 102 mmol/L (ref 98–111)
Creatinine, Ser: 1.03 mg/dL (ref 0.61–1.24)
GFR calc Af Amer: >60 mL/min
GFR calc non Af Amer: >60 mL/min
Glucose, Bld: 70 mg/dL (ref 70–99)
Potassium: 3.2 mmol/L — ABNORMAL LOW (ref 3.5–5.1)
Sodium: 136 mmol/L (ref 135–145)
Total Bilirubin: 0.4 mg/dL (ref 0.3–1.2)
Total Protein: 8.7 g/dL — ABNORMAL HIGH (ref 6.5–8.1)

## 2019-07-24 LAB — ETHANOL: Alcohol, Ethyl (B): 85 mg/dL — ABNORMAL HIGH

## 2019-07-24 LAB — ACETAMINOPHEN LEVEL: Acetaminophen (Tylenol), Serum: <10 ug/mL — ABNORMAL LOW (ref 10–30)

## 2019-07-24 MED ORDER — POTASSIUM CHLORIDE CRYS ER 20 MEQ PO TBCR
40.0000 meq | EXTENDED_RELEASE_TABLET | Freq: Once | ORAL | Status: AC
  Administered 2019-07-24: 40 meq via ORAL
  Filled 2019-07-24: qty 2

## 2019-07-24 NOTE — ED Notes (Signed)
Vol/Pending placement  

## 2019-07-24 NOTE — ED Notes (Signed)
Patient ate 100% of his dinner up and  out of room to bathroom, brought all his empty trays to trash. Back to bed.

## 2019-07-24 NOTE — ED Notes (Signed)
Hourly rounding reveals patient in room. No complaints, stable, in no acute distress. Q15 minute rounds and monitoring via Rover and Officer to continue.   

## 2019-07-24 NOTE — ED Triage Notes (Signed)
Patient coming in voluntary with [employer] officer for [place]. Patient reports having these thoughts for years. Patient denies any new stressors or changes. Patient reports he has not been consistently taking his medication.

## 2019-07-24 NOTE — ED Notes (Signed)
Patient sleeping comfortably, has not been out of room. Patient encouraged to get up and go out into day room. Patient declined, pulled covers over head and went back to sleep.

## 2019-07-24 NOTE — ED Notes (Signed)
Dinner tray provided

## 2019-07-24 NOTE — ED Notes (Addendum)
Patient changed into hospital provided scrubs by this RN and Lattie Haw NT. Patient's belongings placed into labeled bag.   Patient belonging's include: Sneakers, red hoodie, dark grey sweat pants, light grey sweat pants.black and orange nike backpack.

## 2019-07-24 NOTE — Consult Note (Signed)
[place] Psychiatry Consult   Reason for Consult: Suicidal Referring Physician: Dr. Karma Greaser Patient Identification: Dustin Bennett MRN:  [id number] Principal Diagnosis: <principal problem not specified> Diagnosis:  Active Problems:   Cannabis use disorder, severe, dependence (HCC)   Amphetamine abuse ([place])   Cocaine use disorder, severe, dependence ([place])   HIV positive ([place])   Substance-induced psychotic disorder with hallucinations ([place])   Tobacco use disorder   Substance induced mood disorder ([place])   MDD (major depressive disorder), recurrent severe, without psychosis ([place])   Total Time spent with patient: 30 minutes  Subjective: "I have been experiencing suicidal thoughts for many years.  I decided to come in and get help." Dustin Bennett is a 33 y.o. male patient presented to [facility] ED via law enforcement voluntary due to suicidal ideation without a plan. The patient reports having suicidal thoughts for many years but has not sought care for it; he disclosed he had been medication non-compliant, which has led him to want to hurt himself. The patient has a history of HIV and voiced he has been using drugs and alcohol. The patient states he last used cocaine, marijuana, and alcohol a week ago.  He expressed he has been using drugs since he was a teenager.  He voiced the amount he uses depends on how much money he has.  He states his drug usage varies from week to week.  He said that the drug used had increased his depressive symptoms.  The patient admits to having passive thoughts of self-harm for a few years. He expressed tonight (12.29.20), he decided to come in and get some help. He states he was afraid he might hurt himself.  The patient was seen face-to-face by this provider; chart reviewed and consulted with Dr. Karma Greaser on 07/24/2019 due to the patient's care. EDP discussed that the patient does meet criteria to be admitted to the psychiatric inpatient unit.  The patient is  alert and oriented x4, calm, cooperative, and mood-congruent with affect on evaluation. The patient does not appear to be responding to internal or external stimuli. Neither is the patient presenting with any delusional thinking. The patient denies auditory or visual hallucinations. The patient admits to suicidal ideation but denies homicidal or self-harm ideations. The patient is not presenting with any psychotic or paranoid behaviors. During an encounter with the patient, he was able to answer questions appropriately.  Plan: The patient is a safety risk to self and does require psychiatric inpatient admission for stabilization and treatment.  HPI: Per Dr. Karma Greaser; Dustin Bennett is a 33 y.o. male with medical and psychiatric history as listed below who presents voluntarily for evaluation of depression and suicidal ideation.  He states he has felt this way for years and his symptoms are severe.  He is not sure what made tonight the night that he sought help, but he called 911 and law enforcement brought him to the emergency department.  He says he feels hopeless and that there is no symptom going on.  He does not have a specific plan for how he would kill himself.  He reports that he is not taking antidepressants that he was on previously.  He is HIV positive and says that he is taking his antiretrovirals.  He denies fever/chills, chest pain, shortness of breath, nausea, vomiting, and abdominal pain.  He says that his symptoms are severe nothing in particular makes them better or worse.  Past Psychiatric History:  Bipolar 1 disorder ([place]) Depression  Risk  to Self:  Yes Risk to Others:  No Prior Inpatient Therapy:  Yes Prior Outpatient Therapy:  Yes  Past Medical History:  Past Medical History:  Diagnosis Date  . Bipolar 1 disorder (HCC)   . Depression   . HIV (human immunodeficiency virus infection) (HCC)     Past Surgical History:  Procedure Laterality Date  . LUNG BIOPSY     Family  History: No family history on file. Family Psychiatric  History:  Social History:  Social History   Substance and Sexual Activity  Alcohol Use Yes     Social History   Substance and Sexual Activity  Drug Use Yes  . Types: Marijuana    Social History   Socioeconomic History  . Marital status: Single    Spouse name: Not on file  . Number of children: Not on file  . Years of education: Not on file  . Highest education level: Not on file  Occupational History  . Not on file  Tobacco Use  . Smoking status: Current Every Day Smoker    Packs/day: 1.00    Types: Cigarettes  . Smokeless tobacco: Never Used  Substance and Sexual Activity  . Alcohol use: Yes  . Drug use: Yes    Types: Marijuana  . Sexual activity: Not on file  Other Topics Concern  . Not on file  Social History Narrative  . Not on file   Social Determinants of Health   Financial Resource Strain:   . Difficulty of Paying Living Expenses: Not on file  Food Insecurity:   . Worried About Programme researcher, broadcasting/film/video in the Last Year: Not on file  . Ran Out of Food in the Last Year: Not on file  Transportation Needs:   . Lack of Transportation (Medical): Not on file  . Lack of Transportation (Non-Medical): Not on file  Physical Activity:   . Days of Exercise per Week: Not on file  . Minutes of Exercise per Session: Not on file  Stress:   . Feeling of Stress : Not on file  Social Connections:   . Frequency of Communication with Friends and Family: Not on file  . Frequency of Social Gatherings with Friends and Family: Not on file  . Attends Religious Services: Not on file  . Active Member of Clubs or Organizations: Not on file  . Attends Banker Meetings: Not on file  . Marital Status: Not on file   Additional Social History:    Allergies:  No Known Allergies  Labs:  Results for orders placed or performed during the hospital encounter of 07/24/19 (from the past 48 hour(s))  Comprehensive  metabolic panel     Status: Abnormal   Collection Time: 07/24/19  2:42 AM  Result Value Ref Range   Sodium 136 135 - 145 mmol/L   Potassium 3.2 (L) 3.5 - 5.1 mmol/L   Chloride 102 98 - 111 mmol/L   CO2 24 22 - 32 mmol/L   Glucose, Bld 70 70 - 99 mg/dL   BUN 11 6 - 20 mg/dL   Creatinine, Ser 1.61 0.61 - 1.24 mg/dL   Calcium 9.5 8.9 - 09.6 mg/dL   Total Protein 8.7 (H) 6.5 - 8.1 g/dL   Albumin 4.1 3.5 - 5.0 g/dL   AST 41 15 - 41 U/L   ALT 24 0 - 44 U/L   Alkaline Phosphatase 63 38 - 126 U/L   Total Bilirubin 0.4 0.3 - 1.2 mg/dL   GFR calc non  Af Amer >60 >60 mL/min   GFR calc Af Amer >60 >60 mL/min   Anion gap 10 5 - 15    Comment: Performed at [facility], [address] [place], [place] [address]  Ethanol     Status: Abnormal   Collection Time: 07/24/19  2:42 AM  Result Value Ref Range   Alcohol, Ethyl (B) 85 (H) <10 mg/dL    Comment: (NOTE) Lowest detectable limit for serum alcohol is 10 mg/dL. For medical purposes only. Performed at [facility], [address] [place], [place] [address]   Salicylate level     Status: Abnormal   Collection Time: 07/24/19  2:42 AM  Result Value Ref Range   Salicylate Lvl <7.0 (L) 7.0 - 30.0 mg/dL    Comment: Performed at [facility], [address] [place], [place] [address]  Acetaminophen level     Status: Abnormal   Collection Time: 07/24/19  2:42 AM  Result Value Ref Range   Acetaminophen (Tylenol), Serum <10 (L) 10 - 30 ug/mL    Comment: (NOTE) Therapeutic concentrations vary significantly. A range of 10-30 ug/mL  may be an effective concentration for many patients. However, some  are best treated at concentrations outside of this range. Acetaminophen concentrations >150 ug/mL at 4 hours after ingestion  and >50 ug/mL at 12 hours after ingestion are often associated with  toxic reactions. Performed at [facility], [address] [place], [place] [address]   cbc     Status: None    Collection Time: 07/24/19  2:42 AM  Result Value Ref Range   WBC 4.7 4.0 - 10.5 K/uL   RBC 5.06 4.22 - 5.81 MIL/uL   Hemoglobin 15.1 13.0 - 17.0 g/dL   HCT 46.942.9 62.939.0 - 52.852.0 %   MCV 84.8 80.0 - 100.0 fL   MCH 29.8 26.0 - 34.0 pg   MCHC 35.2 30.0 - 36.0 g/dL   RDW 41.313.9 24.411.5 - 01.015.5 %   Platelets 219 150 - 400 K/uL   nRBC 0.0 0.0 - 0.2 %    Comment: Performed at [facility], [address] [place], [place] [address]  Urine Drug Screen, Qualitative     Status: Abnormal   Collection Time: 07/24/19  2:42 AM  Result Value Ref Range   Tricyclic, Ur Screen NONE DETECTED NONE DETECTED   Amphetamines, Ur Screen NONE DETECTED NONE DETECTED   MDMA (Ecstasy)Ur Screen NONE DETECTED NONE DETECTED   Cocaine Metabolite,Ur [place] POSITIVE (A) NONE DETECTED   Opiate, Ur Screen NONE DETECTED NONE DETECTED   Phencyclidine (PCP) Ur S NONE DETECTED NONE DETECTED   Cannabinoid 50 Ng, Ur [place] NONE DETECTED NONE DETECTED   Barbiturates, Ur Screen NONE DETECTED NONE DETECTED   Benzodiazepine, Ur Scrn NONE DETECTED NONE DETECTED   Methadone Scn, Ur NONE DETECTED NONE DETECTED    Comment: (NOTE) Tricyclics + metabolites, urine    Cutoff 1000 ng/mL Amphetamines + metabolites, urine  Cutoff 1000 ng/mL MDMA (Ecstasy), urine              Cutoff 500 ng/mL Cocaine Metabolite, urine          Cutoff 300 ng/mL Opiate + metabolites, urine        Cutoff 300 ng/mL Phencyclidine (PCP), urine         Cutoff 25 ng/mL Cannabinoid, urine                 Cutoff 50 ng/mL Barbiturates + metabolites, urine  Cutoff  200 ng/mL Benzodiazepine, urine              Cutoff 200 ng/mL Methadone, urine                   Cutoff 300 ng/mL The urine drug screen provides only a preliminary, unconfirmed analytical test result and should not be used for non-medical purposes. Clinical consideration and professional judgment should be applied to any positive drug screen result due to possible interfering substances. A more specific  alternate chemical method must be used in order to obtain a confirmed analytical result. Gas chromatography / mass spectrometry (GC/MS) is the preferred confirmat ory method. Performed at [facility], [address] [place], [place] [address]     No current facility-administered medications for this encounter.   Current Outpatient Medications  Medication Sig Dispense Refill  . methylPREDNISolone (MEDROL DOSEPAK) 4 MG TBPK tablet Take as directed 21 tablet 0  . QUEtiapine (SEROQUEL) 300 MG tablet Take 1 tablet (300 mg total) by mouth at bedtime. 30 tablet 0  . TRIUMEQ 600-50-300 MG tablet Take 1 tablet by mouth daily.  11    Musculoskeletal: Strength & Muscle Tone: within normal limits Gait & Station: normal Patient leans: N/A  Psychiatric Specialty Exam: Physical Exam  Nursing note and vitals reviewed. Constitutional: He is oriented to person, place, and time.  Respiratory: Effort normal.  Musculoskeletal:        General: Normal range of motion.     Cervical back: Normal range of motion and neck supple.  Neurological: He is alert and oriented to person, place, and time.  Psychiatric: His behavior is normal.    Review of Systems  Psychiatric/Behavioral: Positive for sleep disturbance and suicidal ideas. The patient is nervous/anxious.   All other systems reviewed and are negative.   Blood pressure (!) 134/94, pulse 89, temperature 98.1 F (36.7 C), temperature source Oral, resp. rate 18, height 5\' 11"  (1.803 m), weight 80 kg, SpO2 100 %.Body mass index is 24.6 kg/m.  General Appearance: Casual  Eye Contact:  Good  Speech:  Clear and Coherent  Volume:  Normal  Mood:  Depressed and Hopeless  Affect:  Blunt, Congruent, Depressed and Flat  Thought Process:  Coherent  Orientation:  Full (Time, Place, and Person)  Thought Content:  WDL and Logical  Suicidal Thoughts:  Yes.  without intent/plan  Homicidal Thoughts:  No  Memory:  Immediate;   Good Recent;    Good Remote;   Good  Judgement:  Poor  Insight:  Lacking  Psychomotor Activity:  Normal  Concentration:  Concentration: Good and Attention Span: Good  Recall:  Good  Fund of Knowledge:  Good  Language:  Good  Akathisia:  Negative  Handed:  Right  AIMS (if indicated):     Assets:  Communication Skills Desire for Improvement Financial Resources/Insurance Physical Health Social Support  ADL's:  Intact  Cognition:  WNL  Sleep:    Insomnia     Treatment Plan Summary: Medication management and Plan The patient meets criteria for psychiatric inpatient admission.  Disposition: Recommend psychiatric Inpatient admission when medically cleared. Supportive therapy provided about ongoing stressors.  , NP 07/24/2019 5:03 AM

## 2019-07-24 NOTE — ED Notes (Signed)
Medical intake obtained by [facility] case manager.

## 2019-07-24 NOTE — ED Notes (Signed)
Pt. Transferred from Triage to room 19H after dressing out and screening for contraband. Report to include Situation, Background, Assessment and Recommendations from [facility]. Pt. Oriented to Quad including Q15 minute rounds as well as Engineer, drilling for their protection. Patient is alert and oriented, warm and dry in no acute distress. Patient denies SI, HI, and AVH. Pt. Encouraged to let me know if needs arise.

## 2019-07-24 NOTE — ED Notes (Signed)
Lunch provided, patient patient stated just put on chair il eat when I wake up.

## 2019-07-24 NOTE — ED Provider Notes (Addendum)
[facility] Emergency Department Provider Note  ____________________________________________   First MD Initiated Contact with Patient 07/24/19 0302     (approximate)  I have reviewed the triage vital signs and the nursing notes.   HISTORY  Chief Complaint Suicidal    HPI Dustin Bennett is a 33 y.o. male with medical and psychiatric history as listed below who presents voluntarily for evaluation of depression and suicidal ideation.  He states he has felt this way for years and his symptoms are severe.  He is not sure what made tonight the night that he sought help, but he called 911 and law enforcement brought him to the emergency department.  He says he feels hopeless and that there is no symptom going on.  He does not have a specific plan for how he would kill himself.  He reports that he is not taking antidepressants that he was on previously.  He is HIV positive and says that he is taking his antiretrovirals.  He denies fever/chills, chest pain, shortness of breath, nausea, vomiting, and abdominal pain.  He says that his symptoms are severe nothing in particular makes them better or worse.         Past Medical History:  Diagnosis Date  . Bipolar 1 disorder (HCC)   . Depression   . HIV (human immunodeficiency virus infection) [facility](HCC)     Patient Active Problem List   Diagnosis Date Noted  . Substance induced mood disorder (HCC) 11/01/2016  . Tobacco use disorder 12/09/2015  . 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 Cannabis use disorder, severe, dependence (HCC) 07/25/2015  . 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 Cocaine use disorder, severe, dependence (HCC) 07/25/2015  . HIV positive (HCC) 07/25/2015    Past Surgical History:  Procedure Laterality Date  . LUNG BIOPSY      Prior to Admission medications   Medication Sig Start Date End Date Taking? Authorizing Provider  methylPREDNISolone (MEDROL  DOSEPAK) 4 MG TBPK tablet Take as directed 02/06/19   Irean HongSung, Jade J, MD  QUEtiapine (SEROQUEL) 300 MG tablet Take 1 tablet (300 mg total) by mouth at bedtime. 11/01/16   Clapacs, Jackquline DenmarkJohn T, MD  TRIUMEQ 600-50-300 MG tablet Take 1 tablet by mouth daily. 09/28/16   [provider]    Allergies Patient has no known allergies.  No family history on file.  Social History Social History   Tobacco Use  . Smoking status: Current Every Day Smoker    Packs/day: 1.00    Types: Cigarettes  . Smokeless tobacco: Never Used  Substance Use Topics  . Alcohol use: Yes  . Drug use: Yes    Types: Marijuana    Review of Systems Constitutional: No fever/chills Eyes: No visual changes. ENT: No sore throat. Cardiovascular: Denies chest pain. Respiratory: Denies shortness of breath. Gastrointestinal: No abdominal pain.  No nausea, no vomiting.  No diarrhea.  No constipation. Genitourinary: Negative for dysuria. Musculoskeletal: Negative for neck pain.  Negative for back pain. Integumentary: Negative for rash. Neurological: Negative for headaches, focal weakness or numbness. Psychiatric:  Substance abuse, depression, suicidal ideation  ____________________________________________   PHYSICAL EXAM:  VITAL SIGNS: ED Triage Vitals [07/24/19 0237]  Enc Vitals Group     BP (!) 134/94     Pulse Rate 89     Resp 18     Temp 98.1 F (36.7 C)     Temp Source Oral     SpO2 100 %  Weight 80 kg (176 lb 5.9 oz)     Height 1.803 m (5\' 11" )     Head Circumference      Peak Flow      Pain Score 10     Pain Loc      Pain Edu?      Excl. in GC?     Constitutional: Alert and oriented.  Generally well-appearing and in no distress. Eyes: Conjunctivae are normal.  Head: Atraumatic. Nose: No congestion/rhinnorhea. Mouth/Throat: Patient is wearing a mask. Neck: No stridor.  No meningeal signs.   Cardiovascular: Normal rate, regular rhythm. Good peripheral circulation. Grossly normal heart sounds.  Respiratory: Normal respiratory effort.  No retractions. Gastrointestinal: Soft and nontender. No distention.  Musculoskeletal: No lower extremity tenderness nor edema. No gross deformities of extremities. Neurologic:  Normal speech and language. No gross focal neurologic deficits are appreciated.  Skin:  Skin is warm, dry and intact. Psychiatric: Mood and affect are normal.  Patient is calm and cooperative, acting appropriately and respectfully with the ED staff, no warning signs of violence or self-harm.  However he does endorse ongoing suicidal ideation and depression.  ____________________________________________   LABS (all labs ordered are listed, but only abnormal results are displayed)  Labs Reviewed  COMPREHENSIVE METABOLIC PANEL - Abnormal; Notable for the following components:      Result Value   Potassium 3.2 (*)    Total Protein 8.7 (*)    All other components within normal limits  ETHANOL - Abnormal; Notable for the following components:   Alcohol, Ethyl (B) 85 (*)    All other components within normal limits  SALICYLATE LEVEL - Abnormal; Notable for the following components:   Salicylate Lvl <7.0 (*)    All other components within normal limits  ACETAMINOPHEN LEVEL - Abnormal; Notable for the following components:   Acetaminophen (Tylenol), Serum <10 (*)    All other components within normal limits  URINE DRUG SCREEN, QUALITATIVE ([facility] ONLY) - Abnormal; Notable for the following components:   Cocaine Metabolite,Ur [place] POSITIVE (*)    All other components within normal limits  CBC   ____________________________________________  EKG  None - EKG not ordered by ED physician ____________________________________________  RADIOLOGY , personally viewed and evaluated these images (plain radiographs) as part of my medical decision making, as well as reviewing the written report by the radiologist.  ED MD interpretation: No indication for emergent imaging   Official radiology report(s): No results found.  ____________________________________________   PROCEDURES   Procedure(s) performed (including Critical Care):  Procedures   ____________________________________________   INITIAL IMPRESSION / MDM / ASSESSMENT AND PLAN / ED COURSE  As part of my medical decision making, I reviewed the following data within the electronic MEDICAL RECORD NUMBER Nursing notes reviewed and incorporated, Labs reviewed , Old chart reviewed, Notes from prior ED visits and [place] Controlled Substance Database   Differential diagnosis includes, but is not limited to, depression, substance-induced mood disorder, polysubstance abuse, adjustment disorder.  The patient reports that he has been on medication in the past but has not taken it for quite some time.  His symptoms have been gradually getting worse over time.  Although he is HIV positive he is medically stable currently and is medically cleared for psychiatric disposition.  He is here voluntarily seeking help and I do not believe he needs to be under IVC at this time.  Labs are generally reassuring and notable for some ethanol and cocaine but otherwise unremarkable other than  a slightly decreased potassium level for which I ordered 40 mEq by mouth.  Cleared for psychiatric disposition.      Clinical Course as of Jul 23 457  Tue Jul 24, 2019  0458 Kennyth Lose with psychiatry said that she recommends inpatient treatment.  The patient will either be admitted here or another facility will be found.  She agrees that he can remain voluntary.   [CF]    Clinical Course User Index [CF] Hinda Kehr, MD     ____________________________________________  FINAL CLINICAL IMPRESSION(S) / ED DIAGNOSES  Final diagnoses:  Depression, unspecified depression type  Suicidal ideation  Substance induced mood disorder ([place])  Polysubstance abuse ([place])     MEDICATIONS GIVEN DURING THIS VISIT:  Medications  potassium  chloride SA (KLOR-CON) CR tablet 40 mEq (40 mEq Oral Given 07/24/19 0453)     ED Discharge Orders    None      *Please note:  TREVION HOBEN was evaluated in Emergency Department on 07/24/2019 for the symptoms described in the history of present illness. He was evaluated in the context of the global COVID-19 pandemic, which necessitated consideration that the patient might be at risk for infection with the SARS-CoV-2 virus that causes COVID-19. Institutional protocols and algorithms that pertain to the evaluation of patients at risk for COVID-19 are in a state of rapid change based on information released by regulatory bodies including the CDC and federal and state organizations. These policies and algorithms were followed during the patient's care in the ED.  Some ED evaluations and interventions may be delayed as a result of limited staffing during the pandemic.*  Note:  This document was prepared using Dragon voice recognition software and may include unintentional dictation errors.   Hinda Kehr, MD 07/24/19 9030    Hinda Kehr, MD 07/24/19 [phone]

## 2019-07-24 NOTE — ED Notes (Signed)
BEHAVIORAL HEALTH ROUNDING Patient sleeping: Yes.   Patient alert and oriented: eyes closed  Appears asleep Behavior appropriate: Yes.  ; If no, describe:  Nutrition and fluids offered: Yes  Toileting and hygiene offered: sleeping Sitter present: q 15 minute observations  Law enforcement present: yes  [employer]   ENVIRONMENTAL ASSESSMENT Potentially harmful objects out of patient reach: Yes.   Personal belongings secured: Yes.   Patient dressed in hospital provided attire only: Yes.   Plastic bags out of patient reach: Yes.   Patient care equipment (cords, cables, call bells, lines, and drains) shortened, removed, or accounted for: Yes.   Equipment and supplies removed from bottom of stretcher: Yes.   Potentially toxic materials out of patient reach: Yes.   Sharps container removed or out of patient reach: Yes.   

## 2019-07-24 NOTE — BH Assessment (Addendum)
Referral information for Psychiatric Hospitalization faxed to:   [facility] [phone])   . [place] [phone] or [phone])  . [facility] [phone])   . [facility] [phone] -or- [phone])  . [place] [phone])  . [place] [phone])  . [place]  . [facility]

## 2019-07-24 NOTE — BH Assessment (Signed)
Per Ellin Mayhew, pt has been DENIED "due to medical; he would be more appropriate for a med-surg unit"   TTS will continue to seek inpatient placement for pt.

## 2019-07-24 NOTE — BH Assessment (Signed)
This Probation officer spoke to [facility] - Intake Clinician [phone]) who is currently reviewing pt for admission. [place] waiting for callback from pt's nurse.

## 2019-07-24 NOTE — ED Notes (Signed)
ED  Is the patient under IVC or is there intent for IVC: voluntary  Is the patient medically cleared: Yes.   Is there vacancy in the ED BHU: Yes.   Is the population mix appropriate for patient: Yes.   Is the patient awaiting placement in inpatient or outpatient setting: Yes.   Has the patient had a psychiatric consult: Yes.   Survey of unit performed for contraband, proper placement and condition of furniture, tampering with fixtures in bathroom, shower, and each patient room: Yes.  ; Findings:  APPEARANCE/BEHAVIOR Calm and cooperative NEURO ASSESSMENT Orientation: oriented x3  Denies pain Hallucinations: No.None noted (Hallucinations) Speech: Normal Gait: normal RESPIRATORY ASSESSMENT Even  Unlabored respirations  CARDIOVASCULAR ASSESSMENT Pulses equal   regular rate  Skin warm and dry   GASTROINTESTINAL ASSESSMENT no GI complaint EXTREMITIES Full ROM  PLAN OF CARE Provide calm/safe environment. Vital signs assessed twice daily. ED BHU Assessment once each 12-hour shift. Collaborate with intake RN daily or as condition indicates. Assure the ED provider has rounded once each shift. Provide and encourage hygiene. Provide redirection as needed. Assess for escalating behavior; address immediately and inform ED provider.  Assess family dynamic and appropriateness for visitation as needed: Yes.  ; If necessary, describe findings:  Educate the patient/family about BHU procedures/visitation: Yes.  ; If necessary, describe findings:

## 2019-07-24 NOTE — ED Notes (Signed)
Breakfast tray provided for pt.  

## 2019-07-24 NOTE — ED Notes (Signed)
Pt observed lying in a hallway bed with eyes closed  Even, unlabored respirations observed   NAD pt appears to be sleeping  I will continue to monitor along with every 15 minute visual observations

## 2019-07-25 LAB — RESPIRATORY PANEL BY RT PCR (FLU A&B, COVID)
Influenza A by PCR: NEGATIVE
Influenza B by PCR: NEGATIVE
SARS Coronavirus 2 by RT PCR: NEGATIVE

## 2019-07-25 NOTE — BH Assessment (Signed)
Patient being reviewed by [facility] for acceptance. Per Nira Conn (Intake RN - [phone]), they would need pt to have a COVID PCR test/result before they can provide acceptance information.  COVID results can be faxed to [phone]

## 2019-07-25 NOTE — BH Assessment (Signed)
Additional Referral information for Psychiatric Hospitalization faxed to;   [facility] [phone])  . [facility] [phone])  . [facility] [phone])   [facility] [phone])

## 2019-07-25 NOTE — BH Assessment (Addendum)
Writer followed up with Referral information for Psychiatric Hospitalization faxed to:    [facility] [phone]) (Declined due to self-pay)   [facility] [phone] or [phone]) Colletta [place] communicated she will look and follow-up)   [facility] [phone]) (On Wait list)   [facility] [phone] -[phone]) (DENIED)   [place] [phone]) (Not taking referrals outside of the county due to [place])   [facility] [phone]) (Currently no intake staff, call back after 7am today 07/25/19)   [place] (Refaxed referral)   [facility] (No beds currently)

## 2019-07-25 NOTE — ED Notes (Signed)
Pt transported to [facility] by [employer].  Pt agreeable to transport to [facility] for continued care. Pt is in NAD at this time.

## 2019-07-25 NOTE — BH Assessment (Addendum)
Patient has been accepted to [facility].  Patient assigned to [facility] Accepting physician is Dr. Domingo Madeira.  Call report to [phone].  Representative was Actuary).   ER Staff is aware of it:  [place], ER Secretary  Dr. Joni Fears, ER MD  Maudie Mercury, Patient's Nurse   Address:  [address] [place], [place] [address]

## 2019-07-25 NOTE — ED Provider Notes (Signed)
-----------------------------------------   8:41 AM on 07/25/2019 -----------------------------------------   Blood pressure 102/64, pulse 84, temperature 98.3 F (36.8 C), temperature source Oral, resp. rate 17, height 5\' 11"  (1.803 m), weight 80 kg, SpO2 97 %.  The patient is calm and cooperative at this time.  There have been no acute events since the last update.  Awaiting disposition plan from Behavioral Medicine and/or Social Work team(s).   Carrie Mew, MD 07/25/19 [phone]

## 2019-07-25 NOTE — ED Notes (Signed)
Pt given a cup of ice water per request.

## 2019-08-07 ENCOUNTER — Telehealth: Payer: Self-pay | Admitting: Pharmacy Technician

## 2019-08-07 NOTE — Telephone Encounter (Signed)
Patient called inquiring about medication assistance for Triumeq and Olanzapine.  Patient stated that he was approved for [facility] program, but left [place] and went to [place].  Told patient that [facility] could not obtain Triumeq but could provide Olanzapine until he completed recertification for [facility] program.  Patient stated that he really needed the Triumeq.  Will come in to complete [facility]'s application and bring eligibility information if he decides to obtain the Olanzapine. Sent a message to Laurette Schimke, Care Manager at [facility].  Raynelle Fanning to reach out to patient and help with recertification for [facility].    Sherilyn Dacosta Care Manager [facility]

## 2019-08-22 ENCOUNTER — Telehealth: Payer: Self-pay

## 2019-08-22 DIAGNOSIS — A539 Syphilis, unspecified: Secondary | ICD-10-CM | POA: Insufficient documentation

## 2019-08-22 NOTE — Telephone Encounter (Signed)
TC to [facility], DIS. [facility] will attempt to reach patient for investigation re: +syphilis. Per CD report patient has been treated. Testing was done via [facility]. Richmond Campbell, RN

## 2020-01-07 ENCOUNTER — Other Ambulatory Visit: Payer: Self-pay

## 2020-01-07 ENCOUNTER — Encounter: Payer: Self-pay | Admitting: Emergency Medicine

## 2020-01-07 ENCOUNTER — Emergency Department
Admission: EM | Admit: 2020-01-07 | Discharge: 2020-01-07 | Disposition: A | Discharge status: Home / Self Care | Payer: Self-pay | Attending: Emergency Medicine | Admitting: Emergency Medicine

## 2020-01-07 DIAGNOSIS — F17210 Nicotine dependence, cigarettes, uncomplicated: Secondary | ICD-10-CM | POA: Insufficient documentation

## 2020-01-07 DIAGNOSIS — R300 Dysuria: Secondary | ICD-10-CM | POA: Insufficient documentation

## 2020-01-07 DIAGNOSIS — B20 Human immunodeficiency virus [HIV] disease: Secondary | ICD-10-CM | POA: Insufficient documentation

## 2020-01-07 DIAGNOSIS — Z7689 Persons encountering health services in other specified circumstances: Secondary | ICD-10-CM

## 2020-01-07 DIAGNOSIS — Z202 Contact with and (suspected) exposure to infections with a predominantly sexual mode of transmission: Secondary | ICD-10-CM | POA: Insufficient documentation

## 2020-01-07 DIAGNOSIS — Z79899 Other long term (current) drug therapy: Secondary | ICD-10-CM | POA: Insufficient documentation

## 2020-01-07 LAB — URINALYSIS, COMPLETE (UACMP) WITH MICROSCOPIC
Bacteria, UA: NONE SEEN
Bilirubin Urine: NEGATIVE
Glucose, UA: NEGATIVE mg/dL
Hgb urine dipstick: NEGATIVE
Ketones, ur: NEGATIVE mg/dL
Nitrite: NEGATIVE
Protein, ur: NEGATIVE mg/dL
Specific Gravity, Urine: 1.021 (ref 1.005–1.030)
Squamous Epithelial / HPF: NONE SEEN (ref 0–5)
pH: 5 (ref 5.0–8.0)

## 2020-01-07 LAB — CHLAMYDIA/NGC RT PCR (ARMC ONLY)
Chlamydia Tr: NOT DETECTED
N gonorrhoeae: DETECTED — AB

## 2020-01-07 MED ORDER — CEFTRIAXONE SODIUM 1 G IJ SOLR
500.0000 mg | Freq: Once | INTRAMUSCULAR | Status: AC
  Administered 2020-01-07: 500 mg via INTRAMUSCULAR
  Filled 2020-01-07: qty 10

## 2020-01-07 MED ORDER — AZITHROMYCIN 500 MG PO TABS
1000.0000 mg | ORAL_TABLET | Freq: Once | ORAL | Status: AC
  Administered 2020-01-07: 1000 mg via ORAL
  Filled 2020-01-07: qty 2

## 2020-01-07 NOTE — ED Triage Notes (Signed)
C/O urethral discharge x 3 days.  Here for STD check.

## 2020-01-07 NOTE — ED Provider Notes (Signed)
[facility] Emergency Department Provider Note  ____________________________________________  Time seen: Approximately 5:17 PM  I have reviewed the triage vital signs and the nursing notes.   HISTORY  Chief Complaint SEXUALLY TRANSMITTED DISEASE    HPI Dustin Bennett is a 34 y.o. male who presents the emergency department complaining of dysuria, penile discharge.  Patient states that he is experience symptoms for the past 2 to 3 days.  He has had unprotected sex.  Patient denies any lesions or chancres.  He denies any abdominal pain, fevers or chills.  No other complaints at this time.  Patient is requesting STD testing.  Patient does have a history of bipolar disorder, HIV, depression, substance-induced mood disorder.         Past Medical History:  Diagnosis Date  . Bipolar 1 disorder (HCC)   . Depression   . HIV (human immunodeficiency virus infection) [facility])     Patient Active Problem List   Diagnosis Date Noted  . Syphilis 08/22/2019  . 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 Depression   . Substance induced mood disorder (HCC) 11/01/2016  . Tobacco use disorder 12/09/2015  . 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 Cannabis use disorder, severe, dependence (HCC) 07/25/2015  . 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 Cocaine use disorder, severe, dependence (HCC) 07/25/2015  . HIV positive (HCC) 07/25/2015    Past Surgical History:  Procedure Laterality Date  . LUNG BIOPSY      Prior to Admission medications   Medication Sig Start Date End Date Taking? Authorizing Provider  QUEtiapine (SEROQUEL) 300 MG tablet Take 1 tablet (300 mg total) by mouth at bedtime. 11/01/16   Clapacs, Jackquline Denmark, MD  TRIUMEQ 600-50-300 MG tablet Take 1 tablet by mouth daily. 09/28/16   [provider]    Allergies Patient has no known allergies.  No family history on  file.  Social History Social History   Tobacco Use  . Smoking status: Current Every Day Smoker    Packs/day: 1.00    Types: Cigarettes  . Smokeless tobacco: Never Used  Vaping Use  . Vaping Use: Never used  Substance Use Topics  . Alcohol use: Yes  . Drug use: Yes    Types: Marijuana     Review of Systems  Constitutional: No fever/chills Eyes: No visual changes. No discharge ENT: No upper respiratory complaints. Cardiovascular: no chest pain. Respiratory: no cough. No SOB. Gastrointestinal: No abdominal pain.  No nausea, no vomiting.  No diarrhea.  No constipation. Genitourinary: Positive for dysuria and penile discharge.  No hematuria Musculoskeletal: Negative for musculoskeletal pain. Skin: Negative for rash, abrasions, lacerations, ecchymosis. Neurological: Negative for headaches, focal weakness or numbness. 10-point ROS otherwise negative.  ____________________________________________   PHYSICAL EXAM:  VITAL SIGNS: ED Triage Vitals  Enc Vitals Group     BP 01/07/20 1535 109/79     Pulse Rate 01/07/20 1535 87     Resp 01/07/20 1535 17     Temp 01/07/20 1535 98.3 F (36.8 C)     Temp Source 01/07/20 1535 Oral     SpO2 01/07/20 1535 96 %     Weight 01/07/20 1510 176 lb 5.9 oz (80 kg)     Height 01/07/20 1510 5\' 11"  (1.803 m)     Head Circumference --      Peak Flow --      Pain Score 01/07/20 1510 0     Pain Loc --  Pain Edu? --      Excl. in [place]? --      Constitutional: Alert and oriented. Well appearing and in no acute distress. Eyes: Conjunctivae are normal. PERRL. EOMI. Head: Atraumatic. ENT:      Ears:       Nose: No congestion/rhinnorhea.      Mouth/Throat: Mucous membranes are moist.  Neck: No stridor.    Cardiovascular: Normal rate, regular rhythm. Normal S1 and S2.  Good peripheral circulation. Respiratory: Normal respiratory effort without tachypnea or retractions. Lungs CTAB. Good air entry to the bases with no decreased or absent  breath sounds. Gastrointestinal: Bowel sounds 4 quadrants. Soft and nontender to palpation. No guarding or rigidity. No palpable masses. No distention. No CVA tenderness. Musculoskeletal: Full range of motion to all extremities. No gross deformities appreciated. Neurologic:  Normal speech and language. No gross focal neurologic deficits are appreciated.  Skin:  Skin is warm, dry and intact. No rash noted. Psychiatric: Mood and affect are normal. Speech and behavior are normal. Patient exhibits appropriate insight and judgement.   ____________________________________________   LABS (all labs ordered are listed, but only abnormal results are displayed)  Labs Reviewed  CHLAMYDIA/NGC RT PCR ([facility] ONLY)  URINALYSIS, COMPLETE (UACMP) WITH MICROSCOPIC   ____________________________________________  EKG   ____________________________________________  RADIOLOGY   No results found.  ____________________________________________    PROCEDURES  Procedure(s) performed:    Procedures    Medications  cefTRIAXone (ROCEPHIN) injection 500 mg (500 mg Intramuscular Given 01/07/20 1656)  azithromycin (ZITHROMAX) tablet 1,000 mg (1,000 mg Oral Given 01/07/20 1655)     ____________________________________________   INITIAL IMPRESSION / ASSESSMENT AND PLAN / ED COURSE  Pertinent labs & imaging results that were available during my care of the patient were reviewed by me and considered in my medical decision making (see chart for details).  Review of the [place] CSRS was performed in accordance of the [place] prior to dispensing any controlled drugs.           Patient's diagnosis is consistent with dysuria, likely STD.  Patient presented to emergency department complaining of penile discharge and dysuria.  Patient has had unprotected sex in the last month.  Based off of symptoms I will treat the patient for gonorrhea and chlamydia with Rocephin and azithromycin.  Labs are still  pending at this time.  Discussed differential with the patient.  At this time patient will receive empiric treatment, await results.  If patient's symptoms persist he should follow-up for further evaluation.. Patient is given ED precautions to return to the ED for any worsening or new symptoms.     ____________________________________________  FINAL CLINICAL IMPRESSION(S) / ED DIAGNOSES  Final diagnoses:  Dysuria  Encounter for assessment of STD exposure      NEW MEDICATIONS STARTED DURING THIS VISIT:  ED Discharge Orders    None          This chart was dictated using voice recognition software/Dragon. Despite best efforts to proofread, errors can occur which can change the meaning. Any change was purely unintentional.    Darletta Moll, PA-C 01/07/20 Babette Relic, MD 01/07/20 1747

## 2020-01-07 NOTE — ED Notes (Signed)
See triage note  Presents with penile discharge  States he is also having some burning with urination

## 2020-01-08 ENCOUNTER — Telehealth: Payer: Self-pay | Admitting: Emergency Medicine

## 2020-01-08 NOTE — Telephone Encounter (Signed)
Called patient to inform of std test results.  No answer and no voicemail.  Patient tested positive for gonorrhea. He was treated during visit.  Partner/s need treatment.

## 2020-01-16 ENCOUNTER — Emergency Department: Payer: Self-pay

## 2020-01-16 ENCOUNTER — Inpatient Hospital Stay
Admission: EM | Admit: 2020-01-16 | Discharge: 2020-01-19 | DRG: 975 | Disposition: A | Discharge status: Home / Self Care | Payer: Self-pay | Attending: Internal Medicine | Admitting: Internal Medicine

## 2020-01-16 ENCOUNTER — Encounter: Payer: Self-pay | Admitting: Emergency Medicine

## 2020-01-16 ENCOUNTER — Other Ambulatory Visit: Payer: Self-pay

## 2020-01-16 DIAGNOSIS — E876 Hypokalemia: Secondary | ICD-10-CM | POA: Diagnosis present

## 2020-01-16 DIAGNOSIS — Z9119 Patient's noncompliance with other medical treatment and regimen: Secondary | ICD-10-CM

## 2020-01-16 DIAGNOSIS — F1410 Cocaine abuse, uncomplicated: Secondary | ICD-10-CM | POA: Diagnosis present

## 2020-01-16 DIAGNOSIS — R52 Pain, unspecified: Secondary | ICD-10-CM

## 2020-01-16 DIAGNOSIS — Z9114 Patient's other noncompliance with medication regimen: Secondary | ICD-10-CM

## 2020-01-16 DIAGNOSIS — D6959 Other secondary thrombocytopenia: Secondary | ICD-10-CM | POA: Diagnosis present

## 2020-01-16 DIAGNOSIS — J189 Pneumonia, unspecified organism: Secondary | ICD-10-CM | POA: Diagnosis present

## 2020-01-16 DIAGNOSIS — B20 Human immunodeficiency virus [HIV] disease: Secondary | ICD-10-CM | POA: Diagnosis present

## 2020-01-16 DIAGNOSIS — A4189 Other specified sepsis: Principal | ICD-10-CM | POA: Diagnosis present

## 2020-01-16 DIAGNOSIS — R110 Nausea: Secondary | ICD-10-CM

## 2020-01-16 DIAGNOSIS — D72819 Decreased white blood cell count, unspecified: Secondary | ICD-10-CM | POA: Diagnosis present

## 2020-01-16 DIAGNOSIS — Z79899 Other long term (current) drug therapy: Secondary | ICD-10-CM

## 2020-01-16 DIAGNOSIS — F1910 Other psychoactive substance abuse, uncomplicated: Secondary | ICD-10-CM

## 2020-01-16 DIAGNOSIS — F1210 Cannabis abuse, uncomplicated: Secondary | ICD-10-CM | POA: Diagnosis present

## 2020-01-16 DIAGNOSIS — F1510 Other stimulant abuse, uncomplicated: Secondary | ICD-10-CM | POA: Diagnosis present

## 2020-01-16 DIAGNOSIS — Z20822 Contact with and (suspected) exposure to covid-19: Secondary | ICD-10-CM | POA: Diagnosis present

## 2020-01-16 DIAGNOSIS — A419 Sepsis, unspecified organism: Secondary | ICD-10-CM

## 2020-01-16 DIAGNOSIS — E871 Hypo-osmolality and hyponatremia: Secondary | ICD-10-CM | POA: Diagnosis present

## 2020-01-16 DIAGNOSIS — F319 Bipolar disorder, unspecified: Secondary | ICD-10-CM | POA: Diagnosis present

## 2020-01-16 DIAGNOSIS — F17200 Nicotine dependence, unspecified, uncomplicated: Secondary | ICD-10-CM | POA: Diagnosis present

## 2020-01-16 DIAGNOSIS — N179 Acute kidney failure, unspecified: Secondary | ICD-10-CM | POA: Diagnosis present

## 2020-01-16 DIAGNOSIS — R1012 Left upper quadrant pain: Secondary | ICD-10-CM | POA: Diagnosis present

## 2020-01-16 DIAGNOSIS — F17210 Nicotine dependence, cigarettes, uncomplicated: Secondary | ICD-10-CM | POA: Diagnosis present

## 2020-01-16 DIAGNOSIS — Z21 Asymptomatic human immunodeficiency virus [HIV] infection status: Secondary | ICD-10-CM | POA: Diagnosis present

## 2020-01-16 DIAGNOSIS — E860 Dehydration: Secondary | ICD-10-CM | POA: Diagnosis present

## 2020-01-16 DIAGNOSIS — R0602 Shortness of breath: Secondary | ICD-10-CM

## 2020-01-16 DIAGNOSIS — F1420 Cocaine dependence, uncomplicated: Secondary | ICD-10-CM | POA: Diagnosis present

## 2020-01-16 LAB — COMPREHENSIVE METABOLIC PANEL
ALT: 21 U/L (ref 0–44)
AST: 30 U/L (ref 15–41)
Albumin: 4.3 g/dL (ref 3.5–5.0)
Alkaline Phosphatase: 50 U/L (ref 38–126)
Anion gap: 12 (ref 5–15)
BUN: 13 mg/dL (ref 6–20)
CO2: 22 mmol/L (ref 22–32)
Calcium: 9.1 mg/dL (ref 8.9–10.3)
Chloride: 98 mmol/L (ref 98–111)
Creatinine, Ser: 1.31 mg/dL — ABNORMAL HIGH (ref 0.61–1.24)
GFR calc Af Amer: >60 mL/min (ref >60)
GFR calc non Af Amer: >60 mL/min (ref >60)
Glucose, Bld: 117 mg/dL — ABNORMAL HIGH (ref 70–99)
Potassium: 3 mmol/L — ABNORMAL LOW (ref 3.5–5.1)
Sodium: 132 mmol/L — ABNORMAL LOW (ref 135–145)
Total Bilirubin: 1.1 mg/dL (ref 0.3–1.2)
Total Protein: 8.5 g/dL — ABNORMAL HIGH (ref 6.5–8.1)

## 2020-01-16 LAB — CBC WITH DIFFERENTIAL/PLATELET
Abs Immature Granulocytes: 0.02 10*3/uL (ref 0.00–0.07)
Basophils Absolute: 0 10*3/uL (ref 0.0–0.1)
Basophils Relative: 1 %
Eosinophils Absolute: 0.1 10*3/uL (ref 0.0–0.5)
Eosinophils Relative: 4 %
HCT: 43.2 % (ref 39.0–52.0)
Hemoglobin: 15.7 g/dL (ref 13.0–17.0)
Immature Granulocytes: 1 %
Lymphocytes Relative: 32 %
Lymphs Abs: 1.1 10*3/uL (ref 0.7–4.0)
MCH: 31.1 pg (ref 26.0–34.0)
MCHC: 36.3 g/dL — ABNORMAL HIGH (ref 30.0–36.0)
MCV: 85.5 fL (ref 80.0–100.0)
Monocytes Absolute: 0.4 10*3/uL (ref 0.1–1.0)
Monocytes Relative: 13 %
Neutro Abs: 1.6 10*3/uL — ABNORMAL LOW (ref 1.7–7.7)
Neutrophils Relative %: 49 %
Platelets: 201 10*3/uL (ref 150–400)
RBC: 5.05 MIL/uL (ref 4.22–5.81)
RDW: 11.9 % (ref 11.5–15.5)
WBC: 3.3 10*3/uL — ABNORMAL LOW (ref 4.0–10.5)
nRBC: 0 % (ref 0.0–0.2)

## 2020-01-16 LAB — STREP PNEUMONIAE URINARY ANTIGEN: Strep Pneumo Urinary Antigen: NEGATIVE

## 2020-01-16 LAB — URINE DRUG SCREEN, QUALITATIVE (ARMC ONLY)
Amphetamines, Ur Screen: POSITIVE — AB
Barbiturates, Ur Screen: NOT DETECTED
Benzodiazepine, Ur Scrn: NOT DETECTED
Cannabinoid 50 Ng, Ur ~~LOC~~: POSITIVE — AB
Cocaine Metabolite,Ur ~~LOC~~: POSITIVE — AB
MDMA (Ecstasy)Ur Screen: NOT DETECTED
Methadone Scn, Ur: NOT DETECTED
Opiate, Ur Screen: NOT DETECTED
Phencyclidine (PCP) Ur S: NOT DETECTED
Tricyclic, Ur Screen: NOT DETECTED

## 2020-01-16 LAB — URINALYSIS, COMPLETE (UACMP) WITH MICROSCOPIC
Bacteria, UA: NONE SEEN
Bilirubin Urine: NEGATIVE
Glucose, UA: NEGATIVE mg/dL
Hgb urine dipstick: NEGATIVE
Ketones, ur: NEGATIVE mg/dL
Leukocytes,Ua: NEGATIVE
Nitrite: NEGATIVE
Protein, ur: NEGATIVE mg/dL
Specific Gravity, Urine: 1.011 (ref 1.005–1.030)
Squamous Epithelial / HPF: NONE SEEN (ref 0–5)
pH: 6 (ref 5.0–8.0)

## 2020-01-16 LAB — PROTIME-INR
INR: 1.1 (ref 0.8–1.2)
Prothrombin Time: 14.2 seconds (ref 11.4–15.2)

## 2020-01-16 LAB — MAGNESIUM: Magnesium: 2 mg/dL (ref 1.7–2.4)

## 2020-01-16 LAB — LACTATE DEHYDROGENASE: LDH: 197 U/L — ABNORMAL HIGH (ref 98–192)

## 2020-01-16 LAB — PHOSPHORUS: Phosphorus: 3.4 mg/dL (ref 2.5–4.6)

## 2020-01-16 LAB — SARS CORONAVIRUS 2 BY RT PCR (HOSPITAL ORDER, PERFORMED IN ~~LOC~~ HOSPITAL LAB): SARS Coronavirus 2: NEGATIVE

## 2020-01-16 LAB — PROCALCITONIN: Procalcitonin: <0.1 ng/mL

## 2020-01-16 LAB — LACTIC ACID, PLASMA: Lactic Acid, Venous: 1.1 mmol/L (ref 0.5–1.9)

## 2020-01-16 MED ORDER — SODIUM CHLORIDE 0.9 % IV SOLN
2.0000 g | INTRAVENOUS | Status: DC
  Administered 2020-01-17: 2 g via INTRAVENOUS
  Filled 2020-01-16 (×2): qty 20

## 2020-01-16 MED ORDER — THIAMINE HCL 100 MG/ML IJ SOLN
100.0000 mg | Freq: Every day | INTRAMUSCULAR | Status: DC
  Filled 2020-01-16: qty 2

## 2020-01-16 MED ORDER — SULFAMETHOXAZOLE-TRIMETHOPRIM 800-160 MG PO TABS
2.0000 | ORAL_TABLET | Freq: Once | ORAL | Status: AC
  Administered 2020-01-16: 2 via ORAL
  Filled 2020-01-16: qty 2

## 2020-01-16 MED ORDER — LORAZEPAM 1 MG PO TABS: 1.0000 mg | ORAL_TABLET | ORAL | Status: DC | PRN

## 2020-01-16 MED ORDER — OLANZAPINE 5 MG PO TBDP
5.0000 mg | ORAL_TABLET | Freq: Every day | ORAL | Status: DC
  Administered 2020-01-17 – 2020-01-18 (×3): 5 mg via ORAL
  Filled 2020-01-16 (×4): qty 1

## 2020-01-16 MED ORDER — SODIUM CHLORIDE 0.9 % IV SOLN
500.0000 mg | INTRAVENOUS | Status: DC
  Administered 2020-01-16: 500 mg via INTRAVENOUS
  Filled 2020-01-16: qty 500

## 2020-01-16 MED ORDER — SODIUM CHLORIDE 0.9 % IV BOLUS
1000.0000 mL | Freq: Once | INTRAVENOUS | Status: AC
  Administered 2020-01-16: 1000 mL via INTRAVENOUS

## 2020-01-16 MED ORDER — NICOTINE 21 MG/24HR TD PT24
21.0000 mg | MEDICATED_PATCH | Freq: Every day | TRANSDERMAL | Status: DC
  Administered 2020-01-16 – 2020-01-19 (×4): 21 mg via TRANSDERMAL
  Filled 2020-01-16 (×4): qty 1

## 2020-01-16 MED ORDER — THIAMINE HCL 100 MG PO TABS
100.0000 mg | ORAL_TABLET | Freq: Every day | ORAL | Status: DC
  Administered 2020-01-16 – 2020-01-19 (×4): 100 mg via ORAL
  Filled 2020-01-16 (×4): qty 1

## 2020-01-16 MED ORDER — LORAZEPAM 2 MG/ML IJ SOLN: 1.0000 mg | INTRAMUSCULAR | Status: DC | PRN

## 2020-01-16 MED ORDER — IOHEXOL 300 MG/ML  SOLN
100.0000 mL | Freq: Once | INTRAMUSCULAR | Status: AC | PRN
  Administered 2020-01-16: 100 mL via INTRAVENOUS

## 2020-01-16 MED ORDER — FOLIC ACID 1 MG PO TABS
1.0000 mg | ORAL_TABLET | Freq: Every day | ORAL | Status: DC
  Administered 2020-01-16 – 2020-01-19 (×4): 1 mg via ORAL
  Filled 2020-01-16 (×4): qty 1

## 2020-01-16 MED ORDER — POTASSIUM CHLORIDE CRYS ER 20 MEQ PO TBCR
40.0000 meq | EXTENDED_RELEASE_TABLET | Freq: Two times a day (BID) | ORAL | Status: AC
  Administered 2020-01-16 – 2020-01-17 (×2): 40 meq via ORAL
  Filled 2020-01-16 (×2): qty 2

## 2020-01-16 MED ORDER — ONDANSETRON HCL 4 MG/2ML IJ SOLN: 4.0000 mg | Freq: Four times a day (QID) | INTRAMUSCULAR | Status: DC | PRN

## 2020-01-16 MED ORDER — ADULT MULTIVITAMIN W/MINERALS CH
1.0000 | ORAL_TABLET | Freq: Every day | ORAL | Status: DC
  Administered 2020-01-16 – 2020-01-19 (×4): 1 via ORAL
  Filled 2020-01-16 (×4): qty 1

## 2020-01-16 MED ORDER — SODIUM CHLORIDE 0.9 % IV SOLN: INTRAVENOUS | Status: DC

## 2020-01-16 MED ORDER — SULFAMETHOXAZOLE-TRIMETHOPRIM 800-160 MG PO TABS
1.0000 | ORAL_TABLET | Freq: Every day | ORAL | Status: DC
  Administered 2020-01-16 – 2020-01-17 (×2): 1 via ORAL
  Filled 2020-01-16 (×2): qty 1

## 2020-01-16 MED ORDER — SODIUM CHLORIDE 0.9 % IV SOLN
2.0000 g | INTRAVENOUS | Status: DC
  Administered 2020-01-16: 2 g via INTRAVENOUS
  Filled 2020-01-16: qty 20

## 2020-01-16 MED ORDER — ENOXAPARIN SODIUM 40 MG/0.4ML ~~LOC~~ SOLN
40.0000 mg | SUBCUTANEOUS | Status: DC
  Administered 2020-01-17 (×2): 40 mg via SUBCUTANEOUS
  Filled 2020-01-16 (×2): qty 0.4

## 2020-01-16 MED ORDER — SODIUM CHLORIDE 0.9 % IV SOLN
500.0000 mg | INTRAVENOUS | Status: DC
  Administered 2020-01-17: 500 mg via INTRAVENOUS
  Filled 2020-01-16 (×2): qty 500

## 2020-01-16 NOTE — ED Triage Notes (Signed)
Pt arrived EMS after starting new HIV medication yesterday.  Feels bad "like body shutting down".  CBG 125 with EMS, VSS with EMS.  Hx of liver problems from previous meds.     Pt here for generalized body aches, LLQ pain, and sore throat.  C/o "just not feeling well".  No known fever. Noted tachypnea.  Does not appear well.  + cough and sore throat.

## 2020-01-16 NOTE — ED Provider Notes (Signed)
[facility] Emergency Department Provider Note  ____________________________________________   First MD Initiated Contact with Patient 01/16/20 [phone]     (approximate)  I have reviewed the triage vital signs and the nursing notes.  History  Chief Complaint Generalized Body Aches    HPI Dustin Bennett is a 34 y.o. male with hx of HIV (not always compliant w/ his medications), bipolar, who presents to the emergency department for body aches, fatigue, tachypnea, left upper quadrant abdominal pain.  Patient reports symptoms have been present for the last several days, constant, progressively worsening. Describes his LUQ pain as cramping, moderate, no radiation, no alleviating/aggravating components. Notably tachypneic on arrival to the ER.  Does report some shortness of breath.  Denies any cough.  Associated with nausea, loose stool.  No sick contacts.  Has not been vaccinated against COVID.  Was started on a new psych med at the beginning of the month (Zyprexa) but clarifies that he has not had any changes to his HIV medications recently.  Does state that he does not always take his medications every day "as he should".  Denies any SI, HI, hallucinations.   Past Medical Hx Past Medical History:  Diagnosis Date  . Bipolar 1 disorder ([place])   . Depression   . HIV (human immunodeficiency virus infection) ([place])     Problem List Patient Active Problem List   Diagnosis Date Noted  . Syphilis 08/22/2019  . MDD (major depressive disorder), recurrent severe, without psychosis ([place]) 07/24/2019  . Depression   . Substance induced mood disorder ([place]) 11/01/2016  . Tobacco use disorder 12/09/2015  . Substance-induced psychotic disorder with hallucinations ([place]) 11/06/2015  . Cannabis use disorder, severe, dependence ([place]) 07/25/2015  . Amphetamine abuse ([place]) 07/25/2015  . Cocaine use disorder, severe, dependence ([place]) 07/25/2015  . HIV positive ([place]) 07/25/2015     Past Surgical Hx Past Surgical History:  Procedure Laterality Date  . LUNG BIOPSY      Medications Prior to Admission medications   Medication Sig Start Date End Date Taking? Authorizing Provider  QUEtiapine (SEROQUEL) 300 MG tablet Take 1 tablet (300 mg total) by mouth at bedtime. 11/01/16   Clapacs, Madie [place], MD  TRIUMEQ 600-50-300 MG tablet Take 1 tablet by mouth daily. 09/28/16   [provider]    Allergies Patient has no known allergies.  Family Hx History reviewed. No pertinent family history.  Social Hx Social History   Tobacco Use  . Smoking status: Current Every Day Smoker    Packs/day: 1.00    Types: Cigarettes  . Smokeless tobacco: Never Used  Vaping Use  . Vaping Use: Never used  Substance Use Topics  . Alcohol use: Yes  . Drug use: Yes    Types: Marijuana     Review of Systems  Constitutional: + body aches, fatigue Eyes: Negative for visual changes. ENT: Negative for sore throat. Cardiovascular: Negative for chest pain. Respiratory: + for shortness of breath. Gastrointestinal: + LUQ abdominal pain, nausea, loose stool Genitourinary: Negative for dysuria. Musculoskeletal: Negative for leg swelling. Skin: Negative for rash. Neurological: Negative for headaches.   Physical Exam  Vital Signs: ED Triage Vitals  Enc Vitals Group     BP 01/16/20 0843 (!) 134/94     Pulse Rate 01/16/20 0843 (!) 102     Resp 01/16/20 0843 (!) 36     Temp 01/16/20 0843 98.4 F (36.9 C)     Temp Source 01/16/20 0843 Oral     SpO2  01/16/20 0843 100 %     Weight 01/16/20 0824 176 lb 5.9 oz (80 kg)     Height 01/16/20 0824 5\' 11"  (1.803 m)     Head Circumference --      Peak Flow --      Pain Score 01/16/20 0824 9     Pain Loc --      Pain Edu? --      Excl. in [place]? --     Constitutional: Alert and oriented.  Appears fatigued but in NAD.  Head: Normocephalic. Atraumatic. Eyes: Conjunctivae clear. Sclera anicteric. Pupils equal and symmetric. Nose: No  masses or lesions. No congestion or rhinorrhea. Mouth/Throat: Wearing mask.  Neck: No stridor. Trachea midline.  Cardiovascular: Normal rate, regular rhythm. Extremities well perfused. Respiratory: Mild tachypnea, but able to speak in full sentences.  Lungs CTAB. Gastrointestinal: Soft. Non-distended. Non-tender.  Genitourinary: Deferred. Musculoskeletal: No lower extremity edema. No deformities. Neurologic:  Normal speech and language. No gross focal or lateralizing neurologic deficits are appreciated.  Skin: Skin is warm, dry and intact. No rash noted. Psychiatric: Mood and affect are appropriate for situation.  EKG  Personally reviewed and interpreted by myself.   Date: 01/16/20 Time: 0828 Rate: 102 Rhythm: sinus Axis: normal Intervals: WNL No STEMI    Radiology  Personally reviewed available imaging myself.   CT A/P-  IMPRESSION:  1. Predominantly ground-glass airspace opacity within the medial  aspect of the left lung base compatible with pneumonia.  2. No acute abdominopelvic findings. Normal appendix.   CXR -  IMPRESSION:  Possible faint infiltrate seen on the lateral view only.    Procedures  Procedure(s) performed (including critical care):  Procedures   Initial Impression / Assessment and Plan / MDM / ED Course  34 y.o. male who presents to the ED for body aches, fatigue, shortness of breath/tachypnea, LUQ abdominal pain  Ddx: pneumonia, colitis, COVID, complication related to HIV  Will plan for labs, imaging  Work-up reveals left base pneumonia (perhaps the etiology of his LUQ discomfort). COVID negative.  Mild leukopenia.  Normal lactic acid.  Slightly decreased sodium and potassium, as well as AKI.  Will plan for treatment with fluids, antibiotics (will include CAP coverage and Bactrim for possible PCP given his hx).  Given his history of HIV with intermittent noncompliance as well as tachypnea on exam, will plan to admit for continued treatment,  likely ID consult. Added on LDH and CD4. Patient is agreeable.  Discussed with hospitalist for admission.      _______________________________   As part of my medical decision making I have reviewed available labs, radiology tests, reviewed old records/performed chart review.    Final Clinical Impression(s) / ED Diagnosis  SOB Body aches Fatigue Pneumonia Hx of HIV    Note:  This document was prepared using Dragon voice recognition software and may include unintentional dictation errors.   31., MD 01/16/20 1455
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
## 2020-01-16 NOTE — ED Notes (Signed)
Pt in CT.

## 2020-01-16 NOTE — ED Notes (Signed)
Pt eating sandwich tray  

## 2020-01-16 NOTE — Telephone Encounter (Signed)
01/16/20--I had not heard from patient, but he is here now.  I spoke to him and informed  Him of positive gonorrhea test and that he was treated during last ED visit.  Advised him to inform partner that they need treatment.

## 2020-01-16 NOTE — H&P (Signed)
[facility] History and Physical  Dustin Bennett:[id number] DOB: June 17, 1986 DOA: 01/16/2020  PCP: Mick Sell, MD  Patient coming from: home  Chief Complaint: Body aches, shortness of breath  HPI: Dustin Bennett is a 34 y.o. male with a medical history of HIV with noncompliance, bipolar disorder, depression, who presents to the emergency department complaints of body aches, shortness of breath, left upper quadrant pain, nausea.  His abdominal pain has been crampy, with no radiation of symptoms.  Patient states he is also been having some chills at home but cannot recall if he had a fever.  Symptoms started approximately 2 days ago.  Patient denies any current cough.  Denies current chest pain, dizziness, headache, diarrhea or constipation.  As above he has had some nausea without vomiting.  Denies any recent travel or ill contacts.  Has not received his Covid vaccination.  ED Course: Patient found to have tachycardia with tachypnea.  Shows possible faint infiltrate on lateral view.  CT abdomen pelvis showed groundglass airspace opacity within the medial aspect of the left lung base compatible with pneumonia.  No acute abdominopelvic findings.  Given history, patient was placed on azithromycin, ceftriaxone and Bactrim.  TRH called for admission.  Review of Systems:  All other systems reviewed and are negative.   Past Medical History:  Diagnosis Date  . Bipolar 1 disorder (HCC)   . Depression   . HIV (human immunodeficiency virus infection) (HCC)     Past Surgical History:  Procedure Laterality Date  . LUNG BIOPSY      Social History:  reports that he has been smoking cigarettes. He has been smoking about 1.00 pack per day. He has never used smokeless tobacco. He reports current alcohol use. He reports current drug use. Drug: Marijuana.  No Known Allergies  History reviewed. No pertinent family history.  Patient states he has no family history of heart disease,  diabetes, hypertension.  Prior to Admission medications   Medication Sig Start Date End Date Taking? Authorizing Provider  BIKTARVY 50-200-25 MG TABS tablet Take 1 tablet by mouth daily. 01/14/20  Yes [provider]  OLANZapine zydis (ZYPREXA) 5 MG disintegrating tablet Take by mouth. 01/14/20  Yes [provider]  sulfamethoxazole-trimethoprim (BACTRIM DS) 800-160 MG tablet Take 1 tablet by mouth daily. 01/14/20  Yes [provider]  TRIUMEQ 600-50-300 MG tablet Take 1 tablet by mouth daily. Patient not taking: Reported on 01/16/2020 09/28/16   [provider]    Physical Exam: Vitals:   01/16/20 1100 01/16/20 1300  BP: (!) 138/99 98/68  Pulse: 72 76  Resp: (!) 24   Temp:    SpO2: 100% 97%     General: Well developed, well nourished, NAD, appears stated age  HEENT: NCAT, PERRLA, EOMI, Anicteic Sclera, mucous membranes moist.   Neck: Supple, no JVD, no masses  Cardiovascular: S1 S2 auscultated, RRR, no murmur  Respiratory: Diminished breath sounds, although clear.  Mild tachypnea.  Abdomen: Soft, nontender, nondistended, + bowel sounds  Extremities: warm dry without cyanosis clubbing or edema  Neuro: AAOx3, cranial nerves grossly intact. Strength 5/5 in patient's upper and lower extremities bilaterally  Skin: Without rashes exudates or nodules  Psych: Normal affect and demeanor with intact judgement and insight  Labs on Admission: I have personally reviewed following labs and imaging studies CBC: Recent Labs  Lab 01/16/20 0844  WBC 3.3*  NEUTROABS 1.6*  HGB 15.7  HCT 43.2  MCV 85.5  PLT 201   Basic  Metabolic Panel: Recent Labs  Lab 01/16/20 0844  NA 132*  K 3.0*  CL 98  CO2 22  GLUCOSE 117*  BUN 13  CREATININE 1.31*  CALCIUM 9.1   GFR: Estimated Creatinine Clearance: 83.8 mL/min (A) (by C-G formula based on SCr of 1.31 mg/dL (H)). Liver Function Tests: Recent Labs  Lab 01/16/20 0844  AST 30  ALT 21  ALKPHOS 50    BILITOT 1.1  PROT 8.5*  ALBUMIN 4.3   No results for input(s): LIPASE, AMYLASE in the last 168 hours. No results for input(s): AMMONIA in the last 168 hours. Coagulation Profile: Recent Labs  Lab 01/16/20 0844  INR 1.1   Cardiac Enzymes: No results for input(s): CKTOTAL, CKMB, CKMBINDEX, TROPONINI in the last 168 hours. BNP (last 3 results) No results for input(s): PROBNP in the last 8760 hours. HbA1C: No results for input(s): HGBA1C in the last 72 hours. CBG: No results for input(s): GLUCAP in the last 168 hours. Lipid Profile: No results for input(s): CHOL, HDL, LDLCALC, TRIG, CHOLHDL, LDLDIRECT in the last 72 hours. Thyroid Function Tests: No results for input(s): TSH, T4TOTAL, FREET4, T3FREE, THYROIDAB in the last 72 hours. Anemia Panel: No results for input(s): VITAMINB12, FOLATE, FERRITIN, TIBC, IRON, RETICCTPCT in the last 72 hours. Urine analysis:    Component Value Date/Time   COLORURINE YELLOW (A) 01/16/2020 0920   APPEARANCEUR CLEAR (A) 01/16/2020 0920   APPEARANCEUR Clear 05/29/2014 1127   LABSPEC 1.011 01/16/2020 0920   LABSPEC 1.023 05/29/2014 1127   PHURINE 6.0 01/16/2020 0920   GLUCOSEU NEGATIVE 01/16/2020 0920   GLUCOSEU Negative 05/29/2014 1127   HGBUR NEGATIVE 01/16/2020 0920   BILIRUBINUR NEGATIVE 01/16/2020 0920   BILIRUBINUR Negative 05/29/2014 1127   KETONESUR NEGATIVE 01/16/2020 0920   PROTEINUR NEGATIVE 01/16/2020 0920   NITRITE NEGATIVE 01/16/2020 0920   LEUKOCYTESUR NEGATIVE 01/16/2020 0920   LEUKOCYTESUR Negative 05/29/2014 1127   Sepsis Labs: @LABRCNTIP (procalcitonin:4,lacticidven:4) ) Recent Results (from the past 240 hour(s))  Chlamydia/NGC rt PCR ([facility] only)     Status: Abnormal   Collection Time: 01/07/20  4:55 PM   Specimen: Urine  Result Value Ref Range Status   Specimen source GC/Chlam URINE, RANDOM  Final   Chlamydia Tr NOT DETECTED NOT DETECTED Final   N gonorrhoeae DETECTED (A) NOT DETECTED Final    Comment:  (NOTE) This CT/NG assay has not been evaluated in patients with a history of  hysterectomy. Performed at [facility], [address] [place], [place] [address]   SARS Coronavirus 2 by RT PCR (hospital order, performed in [facility] hospital lab) Nasopharyngeal     Status: None   Collection Time: 01/16/20 10:09 AM   Specimen: Nasopharyngeal  Result Value Ref Range Status   SARS Coronavirus 2 NEGATIVE NEGATIVE Final    Comment: (NOTE) SARS-CoV-2 target nucleic acids are NOT DETECTED.  The SARS-CoV-2 RNA is generally detectable in upper and lower respiratory specimens during the acute phase of infection. The lowest concentration of SARS-CoV-2 viral copies this assay can detect is 250 copies / mL. A negative result does not preclude SARS-CoV-2 infection and should not be used as the sole basis for treatment or other patient management decisions.  A negative result may occur with improper specimen collection / handling, submission of specimen other than nasopharyngeal swab, presence of viral mutation(s) within the areas targeted by this assay, and inadequate number of viral copies (<250 copies / mL). A negative result must be combined with clinical observations, patient history, and epidemiological information.  Fact Sheet for Patients:   BoilerBrush.com.cy  Fact Sheet for Healthcare Providers: https://pope.com/  This test is not yet approved or  cleared by the [place] FDA and has been authorized for detection and/or diagnosis of SARS-CoV-2 by FDA under an Emergency Use Authorization (EUA).  This EUA will remain in effect (meaning this test can be used) for the duration of the COVID-19 declaration under Section 564(b)(1) of the Act, 21 U.S.C. section 360bbb-3(b)(1), unless the authorization is terminated or revoked sooner.  Performed at [facility], [address] [place], [place] [address]       Radiological Exams on Admission: DG Chest 2 View  Result Date: 01/16/2020 CLINICAL DATA:  Shortness of breath and chest pain. Suspected sepsis. EXAM: CHEST - 2 VIEW COMPARISON:  None. FINDINGS: The heart size and mediastinal contours are within normal limits. On the lateral view there is increased density over the lower thoracic spine which may represent a patchy infiltrate but this is not visible on the PA view. No effusions. Bones are normal. IMPRESSION: Possible faint infiltrate seen on the lateral view only. Electronically Signed   By: Francene Boyers M.D.   On: 01/16/2020 09:32   CT Abdomen Pelvis W Contrast  Result Date: 01/16/2020 CLINICAL DATA:  Lower abdominal pain, generalized body aches EXAM: CT ABDOMEN AND PELVIS WITH CONTRAST TECHNIQUE: Multidetector CT imaging of the abdomen and pelvis was performed using the standard protocol following bolus administration of intravenous contrast. CONTRAST:  OMNIPAQUE IOHEXOL 300 MG/ML  SOLN COMPARISON:  None. FINDINGS: Lower chest: Predominantly ground-glass airspace opacity within the medial aspect of the left lung base compatible with pneumonia. No pleural effusion. Normal heart size. No pericardial effusion. Hepatobiliary: No focal liver abnormality is seen. No gallstones, gallbladder wall thickening, or biliary dilatation. Pancreas: Unremarkable. No pancreatic ductal dilatation or surrounding inflammatory changes. Spleen: Normal in size without focal abnormality. Adrenals/Urinary Tract: Unremarkable adrenal glands. Ovoid 1.9 cm cyst within the mid pole of the left kidney. There are additional scattered subcentimeter low-density lesions throughout both kidneys, too small to definitively characterize but most likely represent cysts. Kidneys have otherwise symmetric enhancement. No renal stone or hydronephrosis. Ureters and urinary bladder are unremarkable. Stomach/Bowel: Stomach is within normal limits. Appendix appears normal. No evidence of bowel  wall thickening, distention, or inflammatory changes. Vascular/Lymphatic: No significant vascular findings are present. No enlarged abdominal or pelvic lymph nodes. Reproductive: Prostate is unremarkable. Other: No abdominal wall hernia or abnormality. No abdominopelvic ascites. Musculoskeletal: No acute or significant osseous findings. IMPRESSION: 1. Predominantly ground-glass airspace opacity within the medial aspect of the left lung base compatible with pneumonia. 2. No acute abdominopelvic findings. Normal appendix. Electronically Signed   By: Duanne Guess D.O.   On: 01/16/2020 11:20    EKG: Independently reviewed.  Sinus tachycardia, rate 102, LVH  Assessment/Plan Sepsis secondary to pneumonia -Present on admission, patient was noted to have tachycardia and tachypnea in the emergency department -CT and chest x-ray noted patient to have pneumonia -Patient placed on azithromycin, Bactrim, ceftriaxone -Given his immunocompromise state, will also consult infectious disease -Continue IV fluids -Have ordered strep pneumonia and Legionella urine antigens, sputum culture if possible  Acute kidney injury -Creatinine 1.31.  In December 2020, creatinine was 1.03 -Likely secondary to the above -Continue IV fluids and monitor BMP  Polysubstance abuse -Patient admits to IV drug use as well as marijuana and cocaine.  Last use approximately 2 to 3 days ago, as well as alcohol and nicotine abuse -UDS ordered -Discussed  cessation -Will place on CIWA as well as nicotine patch  HIV -Patient admits to noncompliance with medications -Infectious disease consulted and appreciated -Will hold restarting home medications at this time  Nausea/left upper quadrant abdominal pain -Suspect secondary to pneumonia -Continue antiemetics as needed -Patient would like to eat something-will place on regular diet  Hypokalemia -Will replace and continue to monitor BMP -Will check magnesium level  Mild  hyponatremia -Suspect secondary to dehydration from the above -Will place on IV fluids and monitor BMP   DVT prophylaxis: Lovenox  Code Status: Full  Family Communication: None at bedside.   Disposition Plan: Home in 2-3 days  Consults called: Infectious disease  Admission status: Admitted inpatient, given patient's immunocompromise state, and need for IV fluids as well as IV antibiotics, suspect patient will need at least 2 midnights of hospitalization.  Time spent: 70 minutes  Nichollas Perusse D.O. [facility]  Between 7am to 7pm - Please see pager noted on [email or web address]  After 7pm go to [email or web address] 01/16/2020, 2:02 PM

## 2020-01-17 ENCOUNTER — Encounter: Payer: Self-pay | Admitting: Internal Medicine

## 2020-01-17 DIAGNOSIS — F1920 Other psychoactive substance dependence, uncomplicated: Secondary | ICD-10-CM

## 2020-01-17 DIAGNOSIS — R9389 Abnormal findings on diagnostic imaging of other specified body structures: Secondary | ICD-10-CM

## 2020-01-17 DIAGNOSIS — Z21 Asymptomatic human immunodeficiency virus [HIV] infection status: Secondary | ICD-10-CM

## 2020-01-17 DIAGNOSIS — F1420 Cocaine dependence, uncomplicated: Secondary | ICD-10-CM

## 2020-01-17 DIAGNOSIS — F17200 Nicotine dependence, unspecified, uncomplicated: Secondary | ICD-10-CM

## 2020-01-17 LAB — CBC
HCT: 37.9 % — ABNORMAL LOW (ref 39.0–52.0)
Hemoglobin: 13.4 g/dL (ref 13.0–17.0)
MCH: 31.8 pg (ref 26.0–34.0)
MCHC: 35.4 g/dL (ref 30.0–36.0)
MCV: 89.8 fL (ref 80.0–100.0)
Platelets: 143 10*3/uL — ABNORMAL LOW (ref 150–400)
RBC: 4.22 MIL/uL (ref 4.22–5.81)
RDW: 12.2 % (ref 11.5–15.5)
WBC: 1.6 10*3/uL — ABNORMAL LOW (ref 4.0–10.5)
nRBC: 0 % (ref 0.0–0.2)

## 2020-01-17 LAB — CBC WITH DIFFERENTIAL/PLATELET
Abs Immature Granulocytes: 0.01 10*3/uL (ref 0.00–0.07)
Basophils Absolute: 0 10*3/uL (ref 0.0–0.1)
Basophils Relative: 1 %
Eosinophils Absolute: 0.1 10*3/uL (ref 0.0–0.5)
Eosinophils Relative: 6 %
HCT: 36.6 % — ABNORMAL LOW (ref 39.0–52.0)
Hemoglobin: 13.1 g/dL (ref 13.0–17.0)
Immature Granulocytes: 1 %
Lymphocytes Relative: 47 %
Lymphs Abs: 0.9 10*3/uL (ref 0.7–4.0)
MCH: 31.6 pg (ref 26.0–34.0)
MCHC: 35.8 g/dL (ref 30.0–36.0)
MCV: 88.4 fL (ref 80.0–100.0)
Monocytes Absolute: 0.3 10*3/uL (ref 0.1–1.0)
Monocytes Relative: 17 %
Neutro Abs: 0.5 10*3/uL — ABNORMAL LOW (ref 1.7–7.7)
Neutrophils Relative %: 28 %
Platelets: 146 10*3/uL — ABNORMAL LOW (ref 150–400)
RBC: 4.14 MIL/uL — ABNORMAL LOW (ref 4.22–5.81)
RDW: 12.4 % (ref 11.5–15.5)
Smear Review: NORMAL
WBC: 1.8 10*3/uL — ABNORMAL LOW (ref 4.0–10.5)
nRBC: 0 % (ref 0.0–0.2)

## 2020-01-17 LAB — BASIC METABOLIC PANEL
Anion gap: 6 (ref 5–15)
BUN: 9 mg/dL (ref 6–20)
CO2: 23 mmol/L (ref 22–32)
Calcium: 8.4 mg/dL — ABNORMAL LOW (ref 8.9–10.3)
Chloride: 107 mmol/L (ref 98–111)
Creatinine, Ser: 1.11 mg/dL (ref 0.61–1.24)
GFR calc Af Amer: >60 mL/min (ref >60)
GFR calc non Af Amer: >60 mL/min (ref >60)
Glucose, Bld: 91 mg/dL (ref 70–99)
Potassium: 4 mmol/L (ref 3.5–5.1)
Sodium: 136 mmol/L (ref 135–145)

## 2020-01-17 LAB — PROCALCITONIN: Procalcitonin: <0.1 ng/mL

## 2020-01-17 LAB — RESPIRATORY PANEL BY PCR

## 2020-01-17 LAB — EXPECTORATED SPUTUM ASSESSMENT W GRAM STAIN, RFLX TO RESP C

## 2020-01-17 LAB — PATHOLOGIST SMEAR REVIEW

## 2020-01-17 MED ORDER — ATOVAQUONE 750 MG/5ML PO SUSP
750.0000 mg | Freq: Two times a day (BID) | ORAL | Status: DC
  Administered 2020-01-17 – 2020-01-18 (×2): 750 mg via ORAL
  Filled 2020-01-17 (×3): qty 5

## 2020-01-17 NOTE — Progress Notes (Signed)
PROGRESS NOTE    Dustin ShipperJeremy J Frew  XBJ:[id number] DOB: 06/28/1986 DOA: 01/16/2020 PCP: Mick SellFitzgerald, David P, MD   Brief Narrative:  HPI on 01/16/2020  Dustin Bennett is a 34 y.o. male with a medical history of HIV with noncompliance, bipolar disorder, depression, who presents to the emergency department complaints of body aches, shortness of breath, left upper quadrant pain, nausea.  His abdominal pain has been crampy, with no radiation of symptoms.  Patient states he is also been having some chills at home but cannot recall if he had a fever.  Symptoms started approximately 2 days ago.  Patient denies any current cough.  Denies current chest pain, dizziness, headache, diarrhea or constipation.  As above he has had some nausea without vomiting.  Denies any recent travel or ill contacts.  Has not received his Covid vaccination.  Assessment & Plan   Sepsis secondary to pneumonia -Present on admission, patient was noted to have tachycardia and tachypnea in the emergency department -CT and chest x-ray noted patient to have pneumonia -Patient placed on azithromycin, Bactrim, ceftriaxone -Given his immunocompromise state- Infectious disease consulted and appreciated -Continue IV fluids -Strep pneumonia urine antigen unremarkable -Legionella urine antigen as well as sputum culture pending -Blood culture showed no growth to date -Procalcitonin unremarkable -have ordered a respiratory viral panel -COVID negative  Acute kidney injury -Creatinine 1.31.  In December 2020, creatinine was 1.03 -Likely secondary to the above  -Improved, creatinine down to 1.11 -Continue IV fluids and monitor BMP  Polysubstance abuse -Patient admits to IV drug use as well as marijuana and cocaine.  Last use approximately 2 to 3 days ago, as well as alcohol and nicotine abuse -UDS ordered: Positive for cocaine, amphetamines, THC -Discussed cessation -Continue CIWA as well as nicotine patch  HIV -Patient admits  to noncompliance with medications -Infectious disease consulted and appreciated-ending recommendations  Nausea/left upper quadrant abdominal pain -Suspect secondary to pneumonia -Continue antiemetics as needed -Patient would like to eat something-will place on regular diet  Hypokalemia -Resolved with replacement, will continue to monitor BMP -Magnesium 2  Mild hyponatremia -Suspect secondary to dehydration from the above -Resolved with IV fluids, continue to monitor BMP  Leukopenia -Will add on differential -Continue to treat pneumonia  DVT Prophylaxis Lovenox  Code Status: Full  Family Communication: None at bedside  Disposition Plan:  Status is: Inpatient  Remains inpatient appropriate because:IV treatments appropriate due to intensity of illness or inability to take PO-pending further recommendations from infectious disease   Dispo: The patient is from: Home              Anticipated d/c is to: Home              Anticipated d/c date is: 2 days              Patient currently is not medically stable to d/c.  Consultants Infectious disease  Procedures  None  Antibiotics   Anti-infectives (From admission, onward)   Start     Dose/Rate Route Frequency Ordered Stop   01/16/20 1415  sulfamethoxazole-trimethoprim (BACTRIM DS) 800-160 MG per tablet 1 tablet     Discontinue     1 tablet Oral Daily 01/16/20 1410     01/16/20 1415  cefTRIAXone (ROCEPHIN) 2 g in sodium chloride 0.9 % 100 mL IVPB     Discontinue     2 g 200 mL/hr over 30 Minutes Intravenous Every 24 hours 01/16/20 1410 01/21/20 1159   01/16/20 1415  azithromycin (ZITHROMAX)  500 mg in sodium chloride 0.9 % 250 mL IVPB     Discontinue     500 mg 250 mL/hr over 60 Minutes Intravenous Every 24 hours 01/16/20 1410 01/21/20 0959   01/16/20 1315  sulfamethoxazole-trimethoprim (BACTRIM DS) 800-160 MG per tablet 2 tablet        2 tablet Oral  Once 01/16/20 1301 01/16/20 1418   01/16/20 1215  cefTRIAXone  (ROCEPHIN) 2 g in sodium chloride 0.9 % 100 mL IVPB  Status:  Discontinued        2 g 200 mL/hr over 30 Minutes Intravenous Every 24 hours 01/16/20 1209 01/16/20 1543   01/16/20 1215  azithromycin (ZITHROMAX) 500 mg in sodium chloride 0.9 % 250 mL IVPB  Status:  Discontinued        500 mg 250 mL/hr over 60 Minutes Intravenous Every 24 hours 01/16/20 1209 01/16/20 1542      Subjective:   Dustin Bennett seen and examined today.  Patient states he is feeling better today.  Denies current shortness of breath.  Denies chest pain, abdominal pain, nausea or vomiting, dizziness or headache.  Wondering when he will be able to go home.  Objective:   Vitals:   01/17/20 0338 01/17/20 0500 01/17/20 1100 01/17/20 1218  BP: (!) 108/59 102/67 (!) 91/55 117/68  Pulse: 69 (!) 54 (!) 59 74  Resp: 18   20  Temp:    97.7 F (36.5 C)  TempSrc:    Oral  SpO2: 98% 98% 96% 100%  Weight:      Height:       No intake or output data in the 24 hours ending 01/17/20 1401 Filed Weights   01/16/20 0824  Weight: 80 kg    Exam  General: Well developed, well nourished, NAD, appears stated age  HEENT: NCAT,  mucous membranes moist.   Cardiovascular: S1 S2 auscultated, RRR, no murmur  Respiratory: Clear to auscultation bilaterally with equal chest rise  Abdomen: Soft, nontender, nondistended, + bowel sounds  Extremities: warm dry without cyanosis clubbing or edema  Neuro: AAOx3, nonfocal  Psych: Normal affect and demeanor    Data Reviewed: I have personally reviewed following labs and imaging studies  CBC: Recent Labs  Lab 01/16/20 0844 01/17/20 0542  WBC 3.3* 1.6*  NEUTROABS 1.6*  --   HGB 15.7 13.4  HCT 43.2 37.9*  MCV 85.5 89.8  PLT 201 143*   Basic Metabolic Panel: Recent Labs  Lab 01/16/20 0844 01/17/20 0542  NA 132* 136  K 3.0* 4.0  CL 98 107  CO2 22 23  GLUCOSE 117* 91  BUN 13 9  CREATININE 1.31* 1.11  CALCIUM 9.1 8.4*  MG 2.0  --   PHOS 3.4  --    GFR: Estimated  Creatinine Clearance: 98.9 mL/min (by C-G formula based on SCr of 1.11 mg/dL). Liver Function Tests: Recent Labs  Lab 01/16/20 0844  AST 30  ALT 21  ALKPHOS 50  BILITOT 1.1  PROT 8.5*  ALBUMIN 4.3   No results for input(s): LIPASE, AMYLASE in the last 168 hours. No results for input(s): AMMONIA in the last 168 hours. Coagulation Profile: Recent Labs  Lab 01/16/20 0844  INR 1.1   Cardiac Enzymes: No results for input(s): CKTOTAL, CKMB, CKMBINDEX, TROPONINI in the last 168 hours. BNP (last 3 results) No results for input(s): PROBNP in the last 8760 hours. HbA1C: No results for input(s): HGBA1C in the last 72 hours. CBG: No results for input(s): GLUCAP in the last 168  hours. Lipid Profile: No results for input(s): CHOL, HDL, LDLCALC, TRIG, CHOLHDL, LDLDIRECT in the last 72 hours. Thyroid Function Tests: No results for input(s): TSH, T4TOTAL, FREET4, T3FREE, THYROIDAB in the last 72 hours. Anemia Panel: No results for input(s): VITAMINB12, FOLATE, FERRITIN, TIBC, IRON, RETICCTPCT in the last 72 hours. Urine analysis:    Component Value Date/Time   COLORURINE YELLOW (A) 01/16/2020 0920   APPEARANCEUR CLEAR (A) 01/16/2020 0920   APPEARANCEUR Clear 05/29/2014 1127   LABSPEC 1.011 01/16/2020 0920   LABSPEC 1.023 05/29/2014 1127   PHURINE 6.0 01/16/2020 0920   GLUCOSEU NEGATIVE 01/16/2020 0920   GLUCOSEU Negative 05/29/2014 1127   HGBUR NEGATIVE 01/16/2020 0920   BILIRUBINUR NEGATIVE 01/16/2020 0920   BILIRUBINUR Negative 05/29/2014 1127   KETONESUR NEGATIVE 01/16/2020 0920   PROTEINUR NEGATIVE 01/16/2020 0920   NITRITE NEGATIVE 01/16/2020 0920   LEUKOCYTESUR NEGATIVE 01/16/2020 0920   LEUKOCYTESUR Negative 05/29/2014 1127   Sepsis Labs: @LABRCNTIP (procalcitonin:4,lacticidven:4)  ) Recent Results (from the past 240 hour(s))  Chlamydia/NGC rt PCR ([place] only)     Status: Abnormal   Collection Time: 01/07/20  4:55 PM   Specimen: Urine  Result Value Ref Range Status     Specimen source GC/Chlam URINE, RANDOM  Final   Chlamydia Tr NOT DETECTED NOT DETECTED Final   N gonorrhoeae DETECTED (A) NOT DETECTED Final    Comment: (NOTE) This CT/NG assay has not been evaluated in patients with a history of  hysterectomy. Performed at [facility], [address] [place], [place] [address]   Culture, blood (Routine x 2)     Status: None (Preliminary result)   Collection Time: 01/16/20  8:44 AM   Specimen: BLOOD  Result Value Ref Range Status   Specimen Description BLOOD RIGHT ASSIST CONTROL  Final   Special Requests   Final    BOTTLES DRAWN AEROBIC AND ANAEROBIC Blood Culture adequate volume   Culture   Final    NO GROWTH < 24 HOURS Performed at [facility], [address] [place], [place] [address]    Report Status PENDING  Incomplete  Culture, blood (Routine x 2)     Status: None (Preliminary result)   Collection Time: 01/16/20  9:20 AM   Specimen: BLOOD  Result Value Ref Range Status   Specimen Description BLOOD BLOOD LEFT HAND  Final   Special Requests   Final    BOTTLES DRAWN AEROBIC AND ANAEROBIC Blood Culture adequate volume   Culture   Final    NO GROWTH < 24 HOURS Performed at [facility], [address] [place], [place] [address]    Report Status PENDING  Incomplete  SARS Coronavirus 2 by RT PCR (hospital order, performed in [place] hospital lab) Nasopharyngeal     Status: None   Collection Time: 01/16/20 10:09 AM   Specimen: Nasopharyngeal  Result Value Ref Range Status   SARS Coronavirus 2 NEGATIVE NEGATIVE Final    Comment: (NOTE) SARS-CoV-2 target nucleic acids are NOT DETECTED.  The SARS-CoV-2 RNA is generally detectable in upper and lower respiratory specimens during the acute phase of infection. The lowest concentration of SARS-CoV-2 viral copies this assay can detect is 250 copies / mL. A negative result does not preclude SARS-CoV-2 infection and should not be used as the sole basis for  treatment or other patient management decisions.  A negative result may occur with improper specimen collection / handling, submission of specimen other than nasopharyngeal swab, presence of viral mutation(s) within the areas targeted  by this assay, and inadequate number of viral copies (<250 copies / mL). A negative result must be combined with clinical observations, patient history, and epidemiological information.  Fact Sheet for Patients:   BoilerBrush.com.cy  Fact Sheet for Healthcare Providers: https://pope.com/  This test is not yet approved or  cleared by the [place] FDA and has been authorized for detection and/or diagnosis of SARS-CoV-2 by FDA under an Emergency Use Authorization (EUA).  This EUA will remain in effect (meaning this test can be used) for the duration of the COVID-19 declaration under Section 564(b)(1) of the Act, 21 U.S.C. section 360bbb-3(b)(1), unless the authorization is terminated or revoked sooner.  Performed at [facility], [address] [place], [place] [address]   Sputum culture     Status: None   Collection Time: 01/17/20  5:42 AM   Specimen: Expectorated Sputum  Result Value Ref Range Status   Specimen Description EXPECTORATED SPUTUM  Final   Special Requests NONE  Final   Sputum evaluation   Final    THIS SPECIMEN IS ACCEPTABLE FOR SPUTUM CULTURE Performed at [facility], [address] [place], [place] [address]    Report Status 01/17/2020 FINAL  Final      Radiology Studies: DG Chest 2 View  Result Date: 01/16/2020 CLINICAL DATA:  Shortness of breath and chest pain. Suspected sepsis. EXAM: CHEST - 2 VIEW COMPARISON:  None. FINDINGS: The heart size and mediastinal contours are within normal limits. On the lateral view there is increased density over the lower thoracic spine which may represent a patchy infiltrate but this is not visible on the PA view. No  effusions. Bones are normal. IMPRESSION: Possible faint infiltrate seen on the lateral view only. Electronically Signed   By: Francene Boyers M.D.   On: 01/16/2020 09:32   CT Abdomen Pelvis W Contrast  Result Date: 01/16/2020 CLINICAL DATA:  Lower abdominal pain, generalized body aches EXAM: CT ABDOMEN AND PELVIS WITH CONTRAST TECHNIQUE: Multidetector CT imaging of the abdomen and pelvis was performed using the standard protocol following bolus administration of intravenous contrast. CONTRAST:  OMNIPAQUE IOHEXOL 300 MG/ML  SOLN COMPARISON:  None. FINDINGS: Lower chest: Predominantly ground-glass airspace opacity within the medial aspect of the left lung base compatible with pneumonia. No pleural effusion. Normal heart size. No pericardial effusion. Hepatobiliary: No focal liver abnormality is seen. No gallstones, gallbladder wall thickening, or biliary dilatation. Pancreas: Unremarkable. No pancreatic ductal dilatation or surrounding inflammatory changes. Spleen: Normal in size without focal abnormality. Adrenals/Urinary Tract: Unremarkable adrenal glands. Ovoid 1.9 cm cyst within the mid pole of the left kidney. There are additional scattered subcentimeter low-density lesions throughout both kidneys, too small to definitively characterize but most likely represent cysts. Kidneys have otherwise symmetric enhancement. No renal stone or hydronephrosis. Ureters and urinary bladder are unremarkable. Stomach/Bowel: Stomach is within normal limits. Appendix appears normal. No evidence of bowel wall thickening, distention, or inflammatory changes. Vascular/Lymphatic: No significant vascular findings are present. No enlarged abdominal or pelvic lymph nodes. Reproductive: Prostate is unremarkable. Other: No abdominal wall hernia or abnormality. No abdominopelvic ascites. Musculoskeletal: No acute or significant osseous findings. IMPRESSION: 1. Predominantly ground-glass airspace opacity within the medial aspect of  the left lung base compatible with pneumonia. 2. No acute abdominopelvic findings. Normal appendix. Electronically Signed   By: Duanne Guess D.O.   On: 01/16/2020 11:20     Scheduled Meds: . enoxaparin (LOVENOX) injection  40 mg Subcutaneous Q24H  . folic acid  1 mg  Oral Daily  . multivitamin with minerals  1 tablet Oral Daily  . nicotine  21 mg Transdermal Daily  . OLANZapine zydis  5 mg Oral QHS  . sulfamethoxazole-trimethoprim  1 tablet Oral Daily  . thiamine  100 mg Oral Daily   Or  . thiamine  100 mg Intravenous Daily   Continuous Infusions: . sodium chloride 75 mL/hr at 01/17/20 0344  . azithromycin Stopped (01/17/20 1342)  . cefTRIAXone (ROCEPHIN)  IV Stopped (01/16/20 1545)     LOS: 1 day   Time Spent in minutes   30 minutes  Maresa Morash D.O. on 01/17/2020 at 2:01 PM  Between 7am to 7pm - Please see pager noted on [email or web address]  After 7pm go to [email or web address]  And look for the night coverage person covering for me after hours  [facility] Office  [phone]

## 2020-01-17 NOTE — ED Notes (Signed)
Report received from [facility] RN. Patient care assumed. Patient/RN introduction complete. Will continue to monitor. Pt without complaints at this time.

## 2020-01-17 NOTE — ED Notes (Signed)
Attempted to call report to [facility] but did not get an answer

## 2020-01-17 NOTE — ED Notes (Signed)
Pt resting in bed. Pt asks that we not let him miss breakfast, advised breakfast would be here soon and we would bring it to him. No further needs expressed.

## 2020-01-17 NOTE — TOC Initial Note (Signed)
Transition of Care [facility]) - Initial/Assessment Note    Patient Details  Name: Dustin Bennett MRN: [id number] Date of Birth: 09/05/1985  Transition of Care [facility]) CM/SW Contact:    [place] Cellar, RN Phone Number: 01/17/2020, 10:56 AM  Clinical Narrative:                 Spoke to patient at bedside. Patient is growing frustrated with waiting for inpatient bed. Patient states he is set up with [facility] for OP PCP but states he does often not keep appointments or not take medication as prescribed. Patient states he understands the importance of medication compliance. Medication is delivered via mail at his home he shares with his mother. Patient has transportation assistance from his mother as needed to appointments. RN CM states he understands the importance of not doing drugs but does not always make the best decision regarding his drug use. Discharge plan is to return home with his mother and OP Follow up via [facility].   Expected Discharge Plan: Home/Self Care Barriers to Discharge: Continued Medical Work up   Patient Goals and CMS Choice Patient states their goals for this hospitalization and ongoing recovery are:: Get better and get home      Expected Discharge Plan and Services Expected Discharge Plan: Home/Self Care       Living arrangements for the past 2 months: Single Family Home                                      Prior Living Arrangements/Services Living arrangements for the past 2 months: Single Family Home Lives with:: Parents Patient language and need for interpreter reviewed:: Yes Do you feel safe going back to the place where you live?: Yes      Need for Family Participation in Patient Care: Yes (Comment) Care giver support system in place?: Yes (comment)   Criminal Activity/Legal Involvement Pertinent to Current Situation/Hospitalization: No - Comment as needed  Activities of Daily Living      Permission Sought/Granted                   Emotional Assessment       Orientation: : Oriented to Self, Oriented to Place, Oriented to  Time, Oriented to Situation Alcohol / Substance Use: Tobacco Use, Alcohol Use, Illicit Drugs Psych Involvement: No (comment)  Admission diagnosis:  Pneumonia [J18.9] Patient Active Problem List   Diagnosis Date Noted  . Pneumonia 01/16/2020  . Syphilis 08/22/2019  . 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 Depression   . Substance induced mood disorder (HCC) 11/01/2016  . Tobacco use disorder 12/09/2015  . 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 Cannabis use disorder, severe, dependence (HCC) 07/25/2015  . 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 Cocaine use disorder, severe, dependence (HCC) 07/25/2015  . HIV positive (HCC) 07/25/2015   PCP:  Mick Sell, MD Pharmacy:   [facility] [address] [place] - [address] [place] [address] Phone: [phone] Fax: [phone]  [facility] [place], [place] - [address] AT Portal to Registered [facility] [address] [place] [address] Phone: [phone] Fax: [phone]     Social Determinants of Health (SDOH) Interventions    Readmission Risk Interventions No flowsheet data found.

## 2020-01-17 NOTE — ED Notes (Signed)
Attempted to call report to [facility], spoke with Zollie Scale who states she will have someone call me back for report

## 2020-01-17 NOTE — Progress Notes (Signed)
ID Pt feeling better No pain No sob No cough   Patient Vitals for the past 24 hrs:  BP Temp Temp src Pulse Resp SpO2  01/17/20 1650 108/64 98.6 F (37 C) Oral 69 20 100 %  01/17/20 1218 117/68 97.7 F (36.5 C) Oral 74 20 100 %  01/17/20 1100 (!) 91/55 -- -- (!) 59 -- 96 %  01/17/20 0500 102/67 -- -- (!) 54 -- 98 %  01/17/20 0338 (!) 108/59 -- -- 69 18 98 %    O/E awake and alert Lying in bed comfortably No resp distress Chest B/l air entry Hss 1s2  Abd soft CNS non focal  Labs CBC Latest Ref Rng & Units 01/17/2020 01/17/2020 01/16/2020  WBC 4.0 - 10.5 K/uL 1.8(L) 1.6(L) 3.3(L)  Hemoglobin 13.0 - 17.0 g/dL 28.3 66.2 94.7  Hematocrit 39 - 52 % 36.6(L) 37.9(L) 43.2  Platelets 150 - 400 K/uL 146(L) 143(L) 201   CMP Latest Ref Rng & Units 01/17/2020 01/16/2020 07/24/2019  Glucose 70 - 99 mg/dL 91 654(Y) 70  BUN 6 - 20 mg/dL 9 13 11   Creatinine 0.61 - 1.24 mg/dL 5.03) 5.46(F  Sodium 135 - 145 mmol/L 136 132(L) 136  Potassium 3.5 - 5.1 mmol/L 4.0 3.0(L) 3.2(L)  Chloride 98 - 111 mmol/L 107 98 102  CO2 22 - 32 mmol/L 23 22 24   Calcium 8.9 - 10.3 mg/dL 6.81) 9.1 9.5  Total Protein 6.5 - 8.1 g/dL - 8.5(H) 8.7(H)  Total Bilirubin 0.3 - 1.2 mg/dL - 1.1 0.4  Alkaline Phos 38 - 126 U/L - 50 63  AST 15 - 41 U/L - 30 41  ALT 0 - 44 U/L - 21 24    Impression/recommendation  Tachycardia, tachypnea, restlessness and abdominal discomfort and other  symptoms present on presentatiton likely due to Polysubstance use especially with IV crystal meth, cocaine use Initially thought to be due to pneumonia but with negative lactate, Normal procal unlikely it is bacterial pneumonia  HIV/AIDS- non compliant- spoke to his clinician at [facility]) NP [facility]- Last Cd4 ocunt in jan was 174 and Vl 60. He was on Biktarvy He will restart Biktarvy Continue Bactrim for PCP prophylaxis He will follow up with her as OP. They will call him if we provide them with his current  contact information  Incidental infiltrate seen on CT on LML- pt is not symptomatic- Procalcitonin< 0.10 X 2- unlikely bacterial infection DC Zithro and ceftriaxone Corona virus ( non SARS COV2) detected in Resp PCR  Leucopenia and mild thrombocytopenia due to AIDS and likely viral illness  Bipolar disorder- need to engage in care  In Jan was treated for RPR 1:128 with 3 PCN injections at the center- will check RPR  Recent Gonorrhea infection and received IM ceftriaxone and zithromax on 01/07/20 Told patient to practice protected Feb sex  Discussed the management with the patient

## 2020-01-17 NOTE — ED Notes (Signed)
Report off to raquel rn  

## 2020-01-18 LAB — CBC WITH DIFFERENTIAL/PLATELET
Abs Immature Granulocytes: 0.01 10*3/uL (ref 0.00–0.07)
Basophils Absolute: 0 10*3/uL (ref 0.0–0.1)
Basophils Relative: 1 %
Eosinophils Absolute: 0.1 10*3/uL (ref 0.0–0.5)
Eosinophils Relative: 6 %
HCT: 36.5 % — ABNORMAL LOW (ref 39.0–52.0)
Hemoglobin: 12.8 g/dL — ABNORMAL LOW (ref 13.0–17.0)
Immature Granulocytes: 1 %
Lymphocytes Relative: 53 %
Lymphs Abs: 0.9 10*3/uL (ref 0.7–4.0)
MCH: 32.1 pg (ref 26.0–34.0)
MCHC: 35.1 g/dL (ref 30.0–36.0)
MCV: 91.5 fL (ref 80.0–100.0)
Monocytes Absolute: 0.3 10*3/uL (ref 0.1–1.0)
Monocytes Relative: 15 %
Neutro Abs: 0.4 10*3/uL — ABNORMAL LOW (ref 1.7–7.7)
Neutrophils Relative %: 24 %
Platelets: 149 10*3/uL — ABNORMAL LOW (ref 150–400)
RBC: 3.99 MIL/uL — ABNORMAL LOW (ref 4.22–5.81)
RDW: 12.4 % (ref 11.5–15.5)
Smear Review: NORMAL
WBC: 1.6 10*3/uL — ABNORMAL LOW (ref 4.0–10.5)
nRBC: 0 % (ref 0.0–0.2)

## 2020-01-18 LAB — BASIC METABOLIC PANEL
Anion gap: 7 (ref 5–15)
BUN: 7 mg/dL (ref 6–20)
CO2: 26 mmol/L (ref 22–32)
Calcium: 8.4 mg/dL — ABNORMAL LOW (ref 8.9–10.3)
Chloride: 107 mmol/L (ref 98–111)
Creatinine, Ser: 1.2 mg/dL (ref 0.61–1.24)
GFR calc Af Amer: >60 mL/min (ref >60)
GFR calc non Af Amer: >60 mL/min (ref >60)
Glucose, Bld: 89 mg/dL (ref 70–99)
Potassium: 4 mmol/L (ref 3.5–5.1)
Sodium: 140 mmol/L (ref 135–145)

## 2020-01-18 LAB — LEGIONELLA PNEUMOPHILA SEROGP 1 UR AG: L. pneumophila Serogp 1 Ur Ag: NEGATIVE

## 2020-01-18 LAB — HELPER T-LYMPH-CD4 (ARMC ONLY)
% CD 4 Pos. Lymph.: 27.9 % — ABNORMAL LOW (ref 30.8–58.5)
Absolute CD 4 Helper: 279 /uL — ABNORMAL LOW (ref 359–1519)
Basophils Absolute: 0 10*3/uL (ref 0.0–0.2)
Basos: 1 %
EOS (ABSOLUTE): 0.2 10*3/uL (ref 0.0–0.4)
Eos: 5 %
Hematocrit: 41.9 % (ref 37.5–51.0)
Hemoglobin: 15.1 g/dL (ref 13.0–17.7)
Immature Grans (Abs): 0 10*3/uL (ref 0.0–0.1)
Immature Granulocytes: 0 %
Lymphocytes Absolute: 1 10*3/uL (ref 0.7–3.1)
Lymphs: 32 %
MCH: 31.9 pg (ref 26.6–33.0)
MCHC: 36 g/dL — ABNORMAL HIGH (ref 31.5–35.7)
MCV: 89 fL (ref 79–97)
Monocytes Absolute: 0.4 10*3/uL (ref 0.1–0.9)
Monocytes: 12 %
Neutrophils Absolute: 1.5 10*3/uL (ref 1.4–7.0)
Neutrophils: 50 %
Platelets: 171 10*3/uL (ref 150–450)
RBC: 4.73 x10E6/uL (ref 4.14–5.80)
RDW: 12.3 % (ref 11.6–15.4)
WBC: 3 10*3/uL — ABNORMAL LOW (ref 3.4–10.8)

## 2020-01-18 LAB — RPR
RPR Ser Ql: REACTIVE — AB
RPR Titer: 1:8 {titer}

## 2020-01-18 LAB — PROCALCITONIN: Procalcitonin: <0.1 ng/mL

## 2020-01-18 MED ORDER — ATOVAQUONE 750 MG/5ML PO SUSP: 750.0000 mg | Freq: Two times a day (BID) | ORAL | 0 refills | Status: AC

## 2020-01-18 MED ORDER — BICTEGRAVIR-EMTRICITAB-TENOFOV 50-200-25 MG PO TABS
1.0000 | ORAL_TABLET | Freq: Every day | ORAL | Status: DC
  Administered 2020-01-18 – 2020-01-19 (×2): 1 via ORAL
  Filled 2020-01-18 (×2): qty 1

## 2020-01-18 NOTE — Discharge Instructions (Signed)
Viral Illness, Adult Viruses are tiny germs that can get into a person's body and cause illness. There are many different types of viruses, and they cause many types of illness. Viral illnesses can range from mild to severe. They can affect various parts of the body. Common illnesses that are caused by a virus include colds and the flu. Viral illnesses also include serious conditions such as HIV/AIDS (human immunodeficiency virus/acquired immunodeficiency syndrome). A few viruses have been linked to certain cancers. What are the causes? Many types of viruses can cause illness. Viruses invade cells in your body, multiply, and cause the infected cells to malfunction or die. When the cell dies, it releases more of the virus. When this happens, you develop symptoms of the illness, and the virus continues to spread to other cells. If the virus takes over the function of the cell, it can cause the cell to divide and grow out of control, as is the case when a virus causes cancer. Different viruses get into the body in different ways. You can get a virus by:  Swallowing food or water that is contaminated with the virus.  Breathing in droplets that have been coughed or sneezed into the air by an infected person.  Touching a surface that has been contaminated with the virus and then touching your eyes, nose, or mouth.  Being bitten by an insect or animal that carries the virus.  Having sexual contact with a person who is infected with the virus.  Being exposed to blood or fluids that contain the virus, either through an open cut or during a transfusion. If a virus enters your body, your body's defense system (immune system) will try to fight the virus. You may be at higher risk for a viral illness if your immune system is weak. What are the signs or symptoms? Symptoms vary depending on the type of virus and the location of the cells that it invades. Common symptoms of the main types of viral illnesses  include: Cold and flu viruses  Fever.  Headache.  Sore throat.  Muscle aches.  Nasal congestion.  Cough. Digestive system (gastrointestinal) viruses  Fever.  Abdominal pain.  Nausea.  Diarrhea. Liver viruses (hepatitis)  Loss of appetite.  Tiredness.  Yellowing of the skin (jaundice). Brain and spinal cord viruses  Fever.  Headache.  Stiff neck.  Nausea and vomiting.  Confusion or sleepiness. Skin viruses  Warts.  Itching.  Rash. Sexually transmitted viruses  Discharge.  Swelling.  Redness.  Rash. How is this treated? Viruses can be difficult to treat because they live within cells. Antibiotic medicines do not treat viruses because these drugs do not get inside cells. Treatment for a viral illness may include:  Resting and drinking plenty of fluids.  Medicines to relieve symptoms. These can include over-the-counter medicine for pain and fever, medicines for cough or congestion, and medicines to relieve diarrhea.  Antiviral medicines. These drugs are available only for certain types of viruses. They may help reduce flu symptoms if taken early. There are also many antiviral medicines for hepatitis and HIV/AIDS. Some viral illnesses can be prevented with vaccinations. A common example is the flu shot. Follow these instructions at home: Medicines   Take over-the-counter and prescription medicines only as told by your health care provider.  If you were prescribed an antiviral medicine, take it as told by your health care provider. Do not stop taking the medicine even if you start to feel better.  Be aware of when   antibiotics are needed and when they are not needed. Antibiotics do not treat viruses. If your health care provider thinks that you may have a bacterial infection as well as a viral infection, you may get an antibiotic. ? Do not ask for an antibiotic prescription if you have been diagnosed with a viral illness. That will not make your  illness go away faster. ? Frequently taking antibiotics when they are not needed can lead to antibiotic resistance. When this develops, the medicine no longer works against the bacteria that it normally fights. General instructions  Drink enough fluids to keep your urine clear or pale yellow.  Rest as much as possible.  Return to your normal activities as told by your health care provider. Ask your health care provider what activities are safe for you.  Keep all follow-up visits as told by your health care provider. This is important. How is this prevented? Take these actions to reduce your risk of viral infection:  Eat a healthy diet and get enough rest.  Wash your hands often with soap and water. This is especially important when you are in public places. If soap and water are not available, use hand sanitizer.  Avoid close contact with friends and family who have a viral illness.  If you travel to areas where viral gastrointestinal infection is common, avoid drinking water or eating raw food.  Keep your immunizations up to date. Get a flu shot every year as told by your health care provider.  Do not share toothbrushes, nail clippers, razors, or needles with other people.  Always practice safe sex.  Contact a health care provider if:  You have symptoms of a viral illness that do not go away.  Your symptoms come back after going away.  Your symptoms get worse. Get help right away if:  You have trouble breathing.  You have a severe headache or a stiff neck.  You have severe vomiting or abdominal pain. This information is not intended to replace advice given to you by your health care provider. Make sure you discuss any questions you have with your health care provider. Document Revised: 06/24/2017 Document Reviewed: 11/21/2015 Elsevier Patient Education  2020 [employer] Inc[employer]  

## 2020-01-18 NOTE — Progress Notes (Signed)
PROGRESS NOTE    Dustin Bennett  ZOX:[id number] DOB: [birth year] DOA: 01/16/2020 PCP: Mick Sell, MD   Brief Narrative:  HPI on 01/16/2020  Dustin Bennett is a 34 y.o. male with a medical history of HIV with noncompliance, bipolar disorder, depression, who presents to the emergency department complaints of body aches, shortness of breath, left upper quadrant pain, nausea.  His abdominal pain has been crampy, with no radiation of symptoms.  Patient states he is also been having some chills at home but cannot recall if he had a fever.  Symptoms started approximately 2 days ago.  Patient denies any current cough.  Denies current chest pain, dizziness, headache, diarrhea or constipation.  As above he has had some nausea without vomiting.  Denies any recent travel or ill contacts.  Has not received his Covid vaccination.  Assessment & Plan   Sepsis secondary to viral syndrome -Present on admission, patient was noted to have tachycardia and tachypnea in the emergency department -CT and chest x-ray noted patient to have pneumonia -Patient was placed on azithromycin, Bactrim, ceftriaxone -Given his immunocompromise state- Infectious disease consulted and appreciated- feels that the infiltrate was incidental as patient is not symptomatic, procalcitonin has been negative x2.  And antibiotics were discontinued. -Strep pneumonia urine antigen unremarkable -Legionella urine antigen negative -Blood culture showed no growth to date -Procalcitonin unremarkable -Respiratory viral panel Coronavirus OC43 (not COVID-19) -COVIDnegative -Continue IVF  Acute kidney injury -Creatinine 1.31.  In December 2020, creatinine was 1.03 -Likely secondary to the above  -Improved, creatinine down to 1.2 -Continue IV fluids and monitor BMP  Polysubstance abuse -Patient admits to IV drug use as well as marijuana and cocaine.  Last use approximately 2 to 3 days ago, as well as alcohol and nicotine abuse -UDS  ordered: Positive for cocaine, amphetamines, THC -Discussed cessation -Continue CIWA as well as nicotine patch  HIV -Patient admits to noncompliance with medications -Infectious disease consulted and appreciated-ending recommendations  Nausea/left upper quadrant abdominal pain -Suspect secondary to pneumonia -Continue antiemetics as needed -Patient would like to eat something-will place on regular diet  Hypokalemia -Resolved with replacement, will continue to monitor BMP -Magnesium 2  Mild hyponatremia -Suspect secondary to dehydration from the above -Resolved with IV fluids, continue to monitor BMP  Leukopenia/thrombocytopenia -likely secondary to HIV/AIDs and viral illness -Repeat CBC in 1 week  Knot -located on back of head -No erythema, or drainage -Has been present for 2 months -Continue supportive care  DVT Prophylaxis Lovenox  Code Status: Full  Family Communication: None at bedside  Disposition Plan:  Status is: Inpatient  Remains inpatient appropriate because:IV treatments appropriate due to intensity of illness or inability to take PO-pending further recommendations from infectious disease   Dispo: The patient is from: Home              Anticipated d/c is to: Home              Anticipated d/c date is: 1 days              Patient currently is not medically stable to d/c.  Consultants Infectious disease  Procedures  None  Antibiotics   Anti-infectives (From admission, onward)   Start     Dose/Rate Route Frequency Ordered Stop   01/18/20 0000  atovaquone (MEPRON) 750 MG/5ML suspension     Discontinue     750 mg Oral 2 times daily with meals 01/18/20 1017 02/17/20 2359   01/17/20 1800  atovaquone (MEPRON)  750 MG/5ML suspension 750 mg     Discontinue     750 mg Oral 2 times daily with meals 01/17/20 1721     01/16/20 1415  sulfamethoxazole-trimethoprim (BACTRIM DS) 800-160 MG per tablet 1 tablet  Status:  Discontinued        1 tablet Oral Daily  01/16/20 1410 01/17/20 1721   01/16/20 1415  cefTRIAXone (ROCEPHIN) 2 g in sodium chloride 0.9 % 100 mL IVPB  Status:  Discontinued        2 g 200 mL/hr over 30 Minutes Intravenous Every 24 hours 01/16/20 1410 01/17/20 1716   01/16/20 1415  azithromycin (ZITHROMAX) 500 mg in sodium chloride 0.9 % 250 mL IVPB  Status:  Discontinued        500 mg 250 mL/hr over 60 Minutes Intravenous Every 24 hours 01/16/20 1410 01/17/20 1716   01/16/20 1315  sulfamethoxazole-trimethoprim (BACTRIM DS) 800-160 MG per tablet 2 tablet        2 tablet Oral  Once 01/16/20 1301 01/16/20 1418   01/16/20 1215  cefTRIAXone (ROCEPHIN) 2 g in sodium chloride 0.9 % 100 mL IVPB  Status:  Discontinued        2 g 200 mL/hr over 30 Minutes Intravenous Every 24 hours 01/16/20 1209 01/16/20 1543   01/16/20 1215  azithromycin (ZITHROMAX) 500 mg in sodium chloride 0.9 % 250 mL IVPB  Status:  Discontinued        500 mg 250 mL/hr over 60 Minutes Intravenous Every 24 hours 01/16/20 1209 01/16/20 1542      Subjective:   Dustin Bennett seen and examined today. Patient states he had a rough night and had chills and was not feeling well. Today he feels better than last night. He denies nausea, vomiting, abdominal pain, diarrhea, constipation, chest pain, shortness of breath.   Objective:   Vitals:   01/17/20 2034 01/17/20 2347 01/18/20 0513 01/18/20 1031  BP: 106/66 108/66 112/72 102/76  Pulse: 76 69 (!) 58 83  Resp: (!) 24 (!) 24 (!) 24 20  Temp: 97.6 F (36.4 C) 97.9 F (36.6 C) 97.8 F (36.6 C) 97.7 F (36.5 C)  TempSrc: Oral Oral Oral Oral  SpO2: 99% 99% 98% 98%  Weight:      Height:        Intake/Output Summary (Last 24 hours) at 01/18/2020 1032 Last data filed at 01/18/2020 4193 Gross per 24 hour  Intake 1460.57 ml  Output --  Net 1460.57 ml   Filed Weights   01/16/20 0824  Weight: 80 kg   Exam  General: Well developed, well nourished, NAD, appears stated age  HEENT: NCAT, mucous membranes moist.  Not  located on back of head right lower area, no fluctuance erythema or drainage noted  Cardiovascular: S1 S2 auscultated, RRR, no murmur  Respiratory: Clear to auscultation bilaterally with equal chest rise  Abdomen: Soft, nontender, nondistended, + bowel sounds  Extremities: warm dry without cyanosis clubbing or edema  Neuro: AAOx3, nonfocal  Psych: normal affect and demeanor    Data Reviewed: I have personally reviewed following labs and imaging studies  CBC: Recent Labs  Lab 01/16/20 0844 01/16/20 1301 01/17/20 0542 01/17/20 1428 01/18/20 0416  WBC 3.3* 3.0* 1.6* 1.8* 1.6*  NEUTROABS 1.6* 1.5  --  0.5* 0.4*  HGB 15.7 15.1 13.4 13.1 12.8*  HCT 43.2 41.9 37.9* 36.6* 36.5*  MCV 85.5 89 89.8 88.4 91.5  PLT 201 171 143* 146* 149*   Basic Metabolic Panel: Recent Labs  Lab 01/16/20  6712 01/17/20 0542 01/18/20 0415  NA 132* 136 140  K 3.0* 4.0 4.0  CL 98 107 107  CO2 22 23 26   GLUCOSE 117* 91 89  BUN 13 9 7   CREATININE 1.31* 1.11 1.20  CALCIUM 9.1 8.4* 8.4*  MG 2.0  --   --   PHOS 3.4  --   --    GFR: Estimated Creatinine Clearance: 91.5 mL/min (by C-G formula based on SCr of 1.2 mg/dL). Liver Function Tests: Recent Labs  Lab 01/16/20 0844  AST 30  ALT 21  ALKPHOS 50  BILITOT 1.1  PROT 8.5*  ALBUMIN 4.3   No results for input(s): LIPASE, AMYLASE in the last 168 hours. No results for input(s): AMMONIA in the last 168 hours. Coagulation Profile: Recent Labs  Lab 01/16/20 0844  INR 1.1   Cardiac Enzymes: No results for input(s): CKTOTAL, CKMB, CKMBINDEX, TROPONINI in the last 168 hours. BNP (last 3 results) No results for input(s): PROBNP in the last 8760 hours. HbA1C: No results for input(s): HGBA1C in the last 72 hours. CBG: No results for input(s): GLUCAP in the last 168 hours. Lipid Profile: No results for input(s): CHOL, HDL, LDLCALC, TRIG, CHOLHDL, LDLDIRECT in the last 72 hours. Thyroid Function Tests: No results for input(s): TSH, T4TOTAL,  FREET4, T3FREE, THYROIDAB in the last 72 hours. Anemia Panel: No results for input(s): VITAMINB12, FOLATE, FERRITIN, TIBC, IRON, RETICCTPCT in the last 72 hours. Urine analysis:    Component Value Date/Time   COLORURINE YELLOW (A) 01/16/2020 0920   APPEARANCEUR CLEAR (A) 01/16/2020 0920   APPEARANCEUR Clear 05/29/2014 1127   LABSPEC 1.011 01/16/2020 0920   LABSPEC 1.023 05/29/2014 1127   PHURINE 6.0 01/16/2020 0920   GLUCOSEU NEGATIVE 01/16/2020 0920   GLUCOSEU Negative 05/29/2014 1127   HGBUR NEGATIVE 01/16/2020 0920   BILIRUBINUR NEGATIVE 01/16/2020 0920   BILIRUBINUR Negative 05/29/2014 1127   KETONESUR NEGATIVE 01/16/2020 0920   PROTEINUR NEGATIVE 01/16/2020 0920   NITRITE NEGATIVE 01/16/2020 0920   LEUKOCYTESUR NEGATIVE 01/16/2020 0920   LEUKOCYTESUR Negative 05/29/2014 1127   Sepsis Labs: @LABRCNTIP (procalcitonin:4,lacticidven:4)  ) Recent Results (from the past 240 hour(s))  Culture, blood (Routine x 2)     Status: None (Preliminary result)   Collection Time: 01/16/20  8:44 AM   Specimen: BLOOD  Result Value Ref Range Status   Specimen Description BLOOD RIGHT ASSIST CONTROL  Final   Special Requests   Final    BOTTLES DRAWN AEROBIC AND ANAEROBIC Blood Culture adequate volume   Culture   Final    NO GROWTH 2 DAYS Performed at [facility], [address], [place], [facility] [address]    Report Status PENDING  Incomplete  Culture, blood (Routine x 2)     Status: None (Preliminary result)   Collection Time: 01/16/20  9:20 AM   Specimen: BLOOD  Result Value Ref Range Status   Specimen Description BLOOD BLOOD LEFT HAND  Final   Special Requests   Final    BOTTLES DRAWN AEROBIC AND ANAEROBIC Blood Culture adequate volume   Culture   Final    NO GROWTH 2 DAYS Performed at [facility], [address], [place], [facility] [address]    Report Status PENDING  Incomplete  SARS Coronavirus 2 by RT PCR (hospital order, performed in [facility]  lab) Nasopharyngeal     Status: None   Collection Time: 01/16/20 10:09 AM   Specimen: Nasopharyngeal  Result Value Ref Range Status   SARS Coronavirus 2 NEGATIVE  NEGATIVE Final    Comment: (NOTE) SARS-CoV-2 target nucleic acids are NOT DETECTED.  The SARS-CoV-2 RNA is generally detectable in upper and lower respiratory specimens during the acute phase of infection. The lowest concentration of SARS-CoV-2 viral copies this assay can detect is 250 copies / mL. A negative result does not preclude SARS-CoV-2 infection and should not be used as the sole basis for treatment or other patient management decisions.  A negative result may occur with improper specimen collection / handling, submission of specimen other than nasopharyngeal swab, presence of viral mutation(s) within the areas targeted by this assay, and inadequate number of viral copies (<250 copies / mL). A negative result must be combined with clinical observations, patient history, and epidemiological information.  Fact Sheet for Patients:   BoilerBrush.com.cyhttps://www.fda.gov/media/136312/download  Fact Sheet for Healthcare Providers: https://pope.com/https://www.fda.gov/media/136313/download  This test is not yet approved or  cleared by the [place] FDA and has been authorized for detection and/or diagnosis of SARS-CoV-2 by FDA under an Emergency Use Authorization (EUA).  This EUA will remain in effect (meaning this test can be used) for the duration of the COVID-19 declaration under Section 564(b)(1) of the Act, 21 U.S.C. section 360bbb-3(b)(1), unless the authorization is terminated or revoked sooner.  Performed at [facility], [address] [place], [place] [address]   Sputum culture     Status: None   Collection Time: 01/17/20  5:42 AM   Specimen: Expectorated Sputum  Result Value Ref Range Status   Specimen Description EXPECTORATED SPUTUM  Final   Special Requests NONE  Final   Sputum evaluation   Final    THIS SPECIMEN IS  ACCEPTABLE FOR SPUTUM CULTURE Performed at [facility], [address] [place], [place] [address]    Report Status 01/17/2020 FINAL  Final  Culture, respiratory     Status: None (Preliminary result)   Collection Time: 01/17/20  5:42 AM  Result Value Ref Range Status   Specimen Description   Final    EXPECTORATED SPUTUM Performed at [facility], [address] [place], [place] [address]    Special Requests   Final    NONE Reflexed from [id number] Performed at [facility], [address] [place], [place] [address]    Gram Stain   Final    FEW WBC PRESENT, PREDOMINANTLY PMN FEW GRAM POSITIVE COCCI IN PAIRS IN CLUSTERS    Culture   Final    CULTURE REINCUBATED FOR BETTER GROWTH Performed at [facility], [address] [place], [place] [address]    Report Status PENDING  Incomplete  Respiratory Panel by PCR     Status: Abnormal   Collection Time: 01/17/20 10:27 AM   Specimen: Nasopharyngeal Swab; Respiratory  Result Value Ref Range Status   Adenovirus NOT DETECTED NOT DETECTED Final   Coronavirus 229E NOT DETECTED NOT DETECTED Final    Comment: (NOTE) The Coronavirus on the Respiratory Panel, DOES NOT test for the novel  Coronavirus (2019 nCoV)    Coronavirus HKU1 NOT DETECTED NOT DETECTED Final   Coronavirus NL63 NOT DETECTED NOT DETECTED Final   Coronavirus OC43 DETECTED (A) NOT DETECTED Final   Metapneumovirus NOT DETECTED NOT DETECTED Final   Rhinovirus / Enterovirus NOT DETECTED NOT DETECTED Final   Influenza A NOT DETECTED NOT DETECTED Final   Influenza B NOT DETECTED NOT DETECTED Final   Parainfluenza Virus 1 NOT DETECTED NOT DETECTED Final   Parainfluenza Virus 2 NOT DETECTED NOT DETECTED Final   Parainfluenza  Virus 3 NOT DETECTED NOT DETECTED Final   Parainfluenza Virus 4 NOT DETECTED NOT DETECTED Final   Respiratory Syncytial Virus NOT DETECTED NOT DETECTED Final   Bordetella pertussis NOT DETECTED NOT DETECTED Final    Chlamydophila pneumoniae NOT DETECTED NOT DETECTED Final   Mycoplasma pneumoniae NOT DETECTED NOT DETECTED Final    Comment: Performed at [facility], [place] [address] [place], [place] [address]      Radiology Studies: CT Abdomen Pelvis W Contrast  Result Date: 01/16/2020 CLINICAL DATA:  Lower abdominal pain, generalized body aches EXAM: CT ABDOMEN AND PELVIS WITH CONTRAST TECHNIQUE: Multidetector CT imaging of the abdomen and pelvis was performed using the standard protocol following bolus administration of intravenous contrast. CONTRAST:  161mL OMNIPAQUE IOHEXOL 300 MG/ML  SOLN COMPARISON:  None. FINDINGS: Lower chest: Predominantly ground-glass airspace opacity within the medial aspect of the left lung base compatible with pneumonia. No pleural effusion. Normal heart size. No pericardial effusion. Hepatobiliary: No focal liver abnormality is seen. No gallstones, gallbladder wall thickening, or biliary dilatation. Pancreas: Unremarkable. No pancreatic ductal dilatation or surrounding inflammatory changes. Spleen: Normal in size without focal abnormality. Adrenals/Urinary Tract: Unremarkable adrenal glands. Ovoid 1.9 cm cyst within the mid pole of the left kidney. There are additional scattered subcentimeter low-density lesions throughout both kidneys, too small to definitively characterize but most likely represent cysts. Kidneys have otherwise symmetric enhancement. No renal stone or hydronephrosis. Ureters and urinary bladder are unremarkable. Stomach/Bowel: Stomach is within normal limits. Appendix appears normal. No evidence of bowel wall thickening, distention, or inflammatory changes. Vascular/Lymphatic: No significant vascular findings are present. No enlarged abdominal or pelvic lymph nodes. Reproductive: Prostate is unremarkable. Other: No abdominal wall hernia or abnormality. No abdominopelvic ascites. Musculoskeletal: No acute or significant osseous findings. IMPRESSION: 1. Predominantly  ground-glass airspace opacity within the medial aspect of the left lung base compatible with pneumonia. 2. No acute abdominopelvic findings. Normal appendix. Electronically Signed   By: Davina Poke D.O.   On: 01/16/2020 11:20     Scheduled Meds: . atovaquone  750 mg Oral BID WC  . enoxaparin (LOVENOX) injection  40 mg Subcutaneous Q24H  . folic acid  1 mg Oral Daily  . multivitamin with minerals  1 tablet Oral Daily  . nicotine  21 mg Transdermal Daily  . OLANZapine zydis  5 mg Oral QHS  . thiamine  100 mg Oral Daily   Or  . thiamine  100 mg Intravenous Daily   Continuous Infusions: . sodium chloride 75 mL/hr at 01/18/20 0627     LOS: 2 days   Time Spent in minutes   30 minutes  Devyn Griffing D.O. on 01/18/2020 at 10:32 AM  Between 7am to 7pm - Please see pager noted on [email or web address]  After 7pm go to [email or web address]  And look for the night coverage person covering for me after hours  [facility] Office  [phone]

## 2020-01-18 NOTE — Progress Notes (Signed)
Patient complains of nod on the back of his head on the right side.

## 2020-01-18 NOTE — Progress Notes (Addendum)
ID Patient was doing well until yesterday and today complaining of feeling bad.  Very nonspecific symptoms.  Cannot explain what he is feeling.  Vitals are all normal.  He is not tachycardic.  He is not tachypneic. No pain abdomen No shortness of breath No headache Asked him whether he he had any visitors.  He said his friend visited him yesterday. Also has a knot on the head for the past 2 months.  Patient Vitals for the past 24 hrs:  BP Temp Temp src Pulse Resp SpO2  01/18/20 1152 111/63 98.1 F (36.7 C) Oral 67 16 98 %  01/18/20 1031 102/76 97.7 F (36.5 C) Oral 83 20 98 %  01/18/20 0513 112/72 97.8 F (36.6 C) Oral (!) 58 (!) 24 98 %  01/17/20 2347 108/66 97.9 F (36.6 C) Oral 69 (!) 24 99 %  01/17/20 2034 106/66 97.6 F (36.4 C) Oral 76 (!) 24 99 %   On examination awake and alert in no distress nonseptic Chest a few rhonchi present Throat NAD Heart sounds S1-S2 no tachycardia Abdomen soft Small nodule felt on the right occipital area posteriorly.  Kind of look bony.  No erythema no tenderness no discharge.  Labs CBC Latest Ref Rng & Units 01/18/2020 01/17/2020 01/17/2020  WBC 4.0 - 10.5 K/uL 1.6(L) 1.8(L) 1.6(L)  Hemoglobin 13.0 - 17.0 g/dL 12.8(L) 13.1 13.4  Hematocrit 39 - 52 % 36.5(L) 36.6(L) 37.9(L)  Platelets 150 - 400 K/uL 149(L) 146(L) 143(L)    CMP Latest Ref Rng & Units 01/18/2020 01/17/2020 01/16/2020  Glucose 70 - 99 mg/dL 89 91 295(M)  BUN 6 - 20 mg/dL 7 9 13   Creatinine 0.61 - 1.24 mg/dL 8.41 3.24)  Sodium 135 - 145 mmol/L 140 136 132(L)  Potassium 3.5 - 5.1 mmol/L 4.0 4.0 3.0(L)  Chloride 98 - 111 mmol/L 107 107 98  CO2 22 - 32 mmol/L 26 23 22   Calcium 8.9 - 10.3 mg/dL 4.01(U) ) 9.1  Total Protein 6.5 - 8.1 g/dL - - 8.5(H)  Total Bilirubin 0.3 - 1.2 mg/dL - - 1.1  Alkaline Phos 38 - 126 U/L - - 50  AST 15 - 41 U/L - - 30  ALT 0 - 44 U/L - - 21    Microbiology Blood culture from 01/16/2020 - no growth  Respiratory viral PCR panel  coronavirus known SARS-CoV-2 present  Legionella negative Strep pneumo antigen negative in the urine  Imaging Chest x-ray: Possible faint infiltrate seen on the lateral view only CT abdomen pelvis groundglass airspace opacity within the medial aspect of the left lung base compatible with pneumonia.   Impression/recommendation Patient presented with vague symptoms of just not feeling well with generalized body aches left lower quadrant pain on 01/16/2020.  He had SIRS-like picture with tachycardia and increased respiratory rate.  He did not have any fever CT abdomen pelvis incidentally revealed a left lower lobe infiltrate groundglass opacity.  He was not having any cough on presentation.  Procalcitonin was also less than 0.10 consecutively on 2 days.  Initially he was treated like pneumonia with ceftriaxone and azithromycin.  But on further questioning he had done IV crystal meth and has also used cocaine in the 2-day.period Before he came to the hospital.  So it was more in tune with polysubstance use and possible withdrawal.  So his symptoms could be either from polysubstance use or viral infection as non SARS-CoV-2 coronavirus was isolated from respiratory panel  NON  Covid coronavirus present in his  respiratory viral panel.  He could have viral pneumonitis  Both ceftriaxone and azithromycin has been stopped after 48 hours.  HIV/AIDS.  In January his CD4 count was less than 200 but on 01/16/2020 it is 279 with 27%. So he does not need Mepron or Bactrim. We will restart Biktarvy  Leukopenia.  Could be related to HIV ,may also be precipitated by a viral infection or substance use.  History of treatment of syphilis in January 2021.  RPR then was 1 :128.  RPR checked today is 1 : 8.  Appropriate response to 3 weekly doses of benzathine penicillin  Recently treated gonococcal urethritis.  Patient was to be discharged today when he complained of feeling sick.  Nonspecific symptoms.  Normal  vitals.  He  talks with his teeth clenched which is seen in crystal meth .  Patient had a friend visiting him yesterday who brought him food but he  denies any illegal substance being given to him.  Discussed the management with the care team. ID will follow him peripherally this weekend.  Call if needed.

## 2020-01-18 NOTE — Discharge Summary (Addendum)
Physician Discharge Summary  Dustin Bennett:[id number] DOB: [birth year] DOA: 01/16/2020  PCP: Leonel Ramsay, MD  Admit date: 01/16/2020 Discharge date: 01/19/2020  Time spent: 45 minutes  Recommendations for Outpatient Follow-up:  Patient will be discharged to home.  Patient will need to follow up with primary care provider within one week of discharge, repeat CBC and BMP.  Follow-up with [facility], Alwyn Pea, NP. patient should continue medications as prescribed.  Patient should follow a regular diet.   Discharge Diagnoses:  Sepsis secondary to viral syndrome Acute kidney injury Polysubstance abuse HIV/AIDs Nausea/left upper quadrant abdominal pain Hypokalemia Mild hyponatremia Leukopenia/thrombocytopenia  Discharge Condition: Stable  Diet recommendation: regular  Filed Weights   01/16/20 0824  Weight: 80 kg    History of present illness:  on 01/16/2020  Dustin Bennett a 34 y.o.malewith a medical history ofHIV with noncompliance, bipolar disorder, depression, who presents to the emergency department complaints of body aches, shortness of breath,leftupper quadrant pain, nausea. His abdominal pain has been crampy, with no radiation of symptoms. Patient states he is also been having some chills at home but cannot recall if he had a fever. Symptoms started approximately 2 days ago. Patient denies any current cough. Denies current chest pain, dizziness, headache, diarrhea or constipation. As above he has had some nausea without vomiting. Denies any recent travel or ill contacts. Has not received his Covid vaccination.  Hospital Course:  Sepsis secondary to viral syndrome -Present on admission, patient was noted to have tachycardia and tachypnea in the emergency department -CT and chest x-ray noted patient to have pneumonia -Patient was placed on azithromycin, Bactrim, ceftriaxone -Given his immunocompromise state- Infectious disease consulted and  appreciated- feels that the infiltrate was incidental as patient is not symptomatic, procalcitonin has been negative x2.  And antibiotics were discontinued. -Strep pneumonia urine antigen unremarkable -Legionella urine antigen negative -Blood culture showed no growth to date -Procalcitonin unremarkable -Respiratory viral panel Coronavirus OC43 (not COVID-19) -COVID negative  Acute kidney injury -Creatinine 1.31. In December 2020, creatinine was 1.03 -Likely secondary to the above  -Improved, creatinine down to 1.2 -repeat BMP in one week  Polysubstance abuse -Patient admits to IV drug use as well as marijuana and cocaine. Last useapproximately 2 to 3 days ago,as well as alcohol and nicotine abuse -UDS ordered: Positive for cocaine, amphetamines, THC -Discussed cessation -Continue CIWA as well as nicotine patch -no signs of withdrawal  HIV/AIDs -Patient admits to noncompliance with medications -Infectious disease consulted and appreciated-recommended the patient restart Biktarvy, continue atovaquone for PCP prophylaxis, follow-up as an outpatient  Nausea/left upper quadrant abdominal pain -Suspect secondary to the above -Continue antiemetics as needed -Resolved  Hypokalemia -Resolved  -Magnesium 2  Mild hyponatremia -Suspect secondary to dehydration from the above -Resolved   Leukopenia/thrombocytopenia -likely secondary to HIV/AIDs and viral illness -Repeat CBC in 1 week  History of syphilis -Treated in Jan 2021 -RPR reactive  Procedures: none  Consultations: Infectious disease  Discharge Exam: Vitals:   01/18/20 1937 01/19/20 0452  BP: 105/62 (!) 98/51  Pulse: (!) 54 62  Resp: 17 18  Temp: 97.8 F (36.6 C) 98 F (36.7 C)  SpO2: 99% 98%     General: Well developed, well nourished, NAD, appears stated age  HEENT: NCAT, mucous membranes moist. Knot located on the back of head (right lower)- no fluctuance, erythema or drainage  noted  Cardiovascular: S1 S2 auscultated, RRR, no murmur  Respiratory: Clear to auscultation bilaterally with equal chest rise  Abdomen: Soft,  nontender, nondistended, + bowel sounds  Extremities: warm dry without cyanosis clubbing or edema  Neuro: AAOx3, nonfocal  Psych: Normal affect and demeanor with intact judgement and insight  Discharge Instructions Discharge Instructions    Discharge instructions   Complete by: As directed    Patient will be discharged to home.  Patient will need to follow up with primary care provider within one week of discharge, repeat CBC and BMP.  Follow-up with [facility], Eula Flax, NP. patient should continue medications as prescribed.  Patient should follow a regular diet.     Allergies as of 01/19/2020   No Known Allergies     Medication List    STOP taking these medications   sulfamethoxazole-trimethoprim 800-160 MG tablet Commonly known as: BACTRIM DS   Triumeq 600-50-300 MG tablet Generic drug: abacavir-dolutegravir-lamiVUDine     TAKE these medications   atovaquone 750 MG/5ML suspension Commonly known as: MEPRON Take 5 mLs (750 mg total) by mouth 2 (two) times daily with a meal.   Biktarvy 50-200-25 MG Tabs tablet Generic drug: bictegravir-emtricitabine-tenofovir AF Take 1 tablet by mouth daily.   OLANZapine zydis 5 MG disintegrating tablet Commonly known as: ZYPREXA Take by mouth.      No Known Allergies  Follow-up Information    Eula Flax, NP. Schedule an appointment as soon as possible for a visit in 1 week(s).   Specialty: Family Medicine Why: Hospital follow up Contact information: .   [place] [address] [phone]                The results of significant diagnostics from this hospitalization (including imaging, microbiology, ancillary and laboratory) are listed below for reference.    Significant Diagnostic Studies: DG Chest 2 View  Result Date: 01/16/2020 CLINICAL DATA:  Shortness of  breath and chest pain. Suspected sepsis. EXAM: CHEST - 2 VIEW COMPARISON:  None. FINDINGS: The heart size and mediastinal contours are within normal limits. On the lateral view there is increased density over the lower thoracic spine which may represent a patchy infiltrate but this is not visible on the PA view. No effusions. Bones are normal. IMPRESSION: Possible faint infiltrate seen on the lateral view only. Electronically Signed   By: Francene Boyers M.D.   On: 01/16/2020 09:32   CT Abdomen Pelvis W Contrast  Result Date: 01/16/2020 CLINICAL DATA:  Lower abdominal pain, generalized body aches EXAM: CT ABDOMEN AND PELVIS WITH CONTRAST TECHNIQUE: Multidetector CT imaging of the abdomen and pelvis was performed using the standard protocol following bolus administration of intravenous contrast. CONTRAST:  OMNIPAQUE IOHEXOL 300 MG/ML  SOLN COMPARISON:  None. FINDINGS: Lower chest: Predominantly ground-glass airspace opacity within the medial aspect of the left lung base compatible with pneumonia. No pleural effusion. Normal heart size. No pericardial effusion. Hepatobiliary: No focal liver abnormality is seen. No gallstones, gallbladder wall thickening, or biliary dilatation. Pancreas: Unremarkable. No pancreatic ductal dilatation or surrounding inflammatory changes. Spleen: Normal in size without focal abnormality. Adrenals/Urinary Tract: Unremarkable adrenal glands. Ovoid 1.9 cm cyst within the mid pole of the left kidney. There are additional scattered subcentimeter low-density lesions throughout both kidneys, too small to definitively characterize but most likely represent cysts. Kidneys have otherwise symmetric enhancement. No renal stone or hydronephrosis. Ureters and urinary bladder are unremarkable. Stomach/Bowel: Stomach is within normal limits. Appendix appears normal. No evidence of bowel wall thickening, distention, or inflammatory changes. Vascular/Lymphatic: No significant vascular findings are  present. No enlarged abdominal or pelvic lymph nodes. Reproductive: Prostate is unremarkable. Other:  No abdominal wall hernia or abnormality. No abdominopelvic ascites. Musculoskeletal: No acute or significant osseous findings. IMPRESSION: 1. Predominantly ground-glass airspace opacity within the medial aspect of the left lung base compatible with pneumonia. 2. No acute abdominopelvic findings. Normal appendix. Electronically Signed   By: Duanne Guess D.O.   On: 01/16/2020 11:20    Microbiology: Recent Results (from the past 240 hour(s))  Culture, blood (Routine x 2)     Status: None (Preliminary result)   Collection Time: 01/16/20  8:44 AM   Specimen: BLOOD  Result Value Ref Range Status   Specimen Description BLOOD RIGHT ASSIST CONTROL  Final   Special Requests   Final    BOTTLES DRAWN AEROBIC AND ANAEROBIC Blood Culture adequate volume   Culture   Final    NO GROWTH 3 DAYS Performed at [facility], [address], [place], [place] [address]    Report Status PENDING  Incomplete  Culture, blood (Routine x 2)     Status: None (Preliminary result)   Collection Time: 01/16/20  9:20 AM   Specimen: BLOOD  Result Value Ref Range Status   Specimen Description BLOOD BLOOD LEFT HAND  Final   Special Requests   Final    BOTTLES DRAWN AEROBIC AND ANAEROBIC Blood Culture adequate volume   Culture   Final    NO GROWTH 3 DAYS Performed at [facility], [address] [place], [place] [address]    Report Status PENDING  Incomplete  SARS Coronavirus 2 by RT PCR (hospital order, performed in [facility] hospital lab) Nasopharyngeal     Status: None   Collection Time: 01/16/20 10:09 AM   Specimen: Nasopharyngeal  Result Value Ref Range Status   SARS Coronavirus 2 NEGATIVE NEGATIVE Final    Comment: (NOTE) SARS-CoV-2 target nucleic acids are NOT DETECTED.  The SARS-CoV-2 RNA is generally detectable in upper and lower respiratory specimens during the acute phase of  infection. The lowest concentration of SARS-CoV-2 viral copies this assay can detect is 250 copies / mL. A negative result does not preclude SARS-CoV-2 infection and should not be used as the sole basis for treatment or other patient management decisions.  A negative result may occur with improper specimen collection / handling, submission of specimen other than nasopharyngeal swab, presence of viral mutation(s) within the areas targeted by this assay, and inadequate number of viral copies (<250 copies / mL). A negative result must be combined with clinical observations, patient history, and epidemiological information.  Fact Sheet for Patients:   BoilerBrush.com.cy  Fact Sheet for Healthcare Providers: https://pope.com/  This test is not yet approved or  cleared by the [place] FDA and has been authorized for detection and/or diagnosis of SARS-CoV-2 by FDA under an Emergency Use Authorization (EUA).  This EUA will remain in effect (meaning this test can be used) for the duration of the COVID-19 declaration under Section 564(b)(1) of the Act, 21 U.S.C. section 360bbb-3(b)(1), unless the authorization is terminated or revoked sooner.  Performed at [facility], [address] [place], [place] [address]   Sputum culture     Status: None   Collection Time: 01/17/20  5:42 AM   Specimen: Expectorated Sputum  Result Value Ref Range Status   Specimen Description EXPECTORATED SPUTUM  Final   Special Requests NONE  Final   Sputum evaluation   Final    THIS SPECIMEN IS ACCEPTABLE FOR SPUTUM CULTURE Performed at [facility], [address] [place], [place] [address]  Report Status 01/17/2020 FINAL  Final  Culture, respiratory     Status: None   Collection Time: 01/17/20  5:42 AM  Result Value Ref Range Status   Specimen Description   Final    EXPECTORATED SPUTUM Performed at [facility], [address] [place], [place] [address]    Special Requests   Final    NONE Reflexed from [id number] Performed at [facility], [address] [place], [place] [address]    Gram Stain   Final    FEW WBC PRESENT, PREDOMINANTLY PMN FEW GRAM POSITIVE COCCI IN PAIRS IN CLUSTERS    Culture   Final    FEW Consistent with normal respiratory flora. Performed at [facility], [address] [place], [place] [address]    Report Status 01/19/2020 FINAL  Final  Respiratory Panel by PCR     Status: Abnormal   Collection Time: 01/17/20 10:27 AM   Specimen: Nasopharyngeal Swab; Respiratory  Result Value Ref Range Status   Adenovirus NOT DETECTED NOT DETECTED Final   Coronavirus 229E NOT DETECTED NOT DETECTED Final    Comment: (NOTE) The Coronavirus on the Respiratory Panel, DOES NOT test for the novel  Coronavirus (2019 nCoV)    Coronavirus HKU1 NOT DETECTED NOT DETECTED Final   Coronavirus NL63 NOT DETECTED NOT DETECTED Final   Coronavirus OC43 DETECTED (A) NOT DETECTED Final   Metapneumovirus NOT DETECTED NOT DETECTED Final   Rhinovirus / Enterovirus NOT DETECTED NOT DETECTED Final   Influenza A NOT DETECTED NOT DETECTED Final   Influenza B NOT DETECTED NOT DETECTED Final   Parainfluenza Virus 1 NOT DETECTED NOT DETECTED Final   Parainfluenza Virus 2 NOT DETECTED NOT DETECTED Final   Parainfluenza Virus 3 NOT DETECTED NOT DETECTED Final   Parainfluenza Virus 4 NOT DETECTED NOT DETECTED Final   Respiratory Syncytial Virus NOT DETECTED NOT DETECTED Final   Bordetella pertussis NOT DETECTED NOT DETECTED Final   Chlamydophila pneumoniae NOT DETECTED NOT DETECTED Final   Mycoplasma pneumoniae NOT DETECTED NOT DETECTED Final    Comment: Performed at [facility], [address] [place], [place] [address]     Labs: Basic Metabolic Panel: Recent Labs  Lab 01/16/20 0844 01/17/20 0542 01/18/20 0415 01/19/20 0714  NA 132* 136 140 138  K 3.0* 4.0 4.0 4.1  CL 98 107 107  109  CO2 22 23 26 23   GLUCOSE 117* 91 89 83  BUN 13 9 7 8   CREATININE 1.31* 1.11 1.20 1.14  CALCIUM 9.1 8.4* 8.4* 8.6*  MG 2.0  --   --   --   PHOS 3.4  --   --   --    Liver Function Tests: Recent Labs  Lab 01/16/20 0844  AST 30  ALT 21  ALKPHOS 50  BILITOT 1.1  PROT 8.5*  ALBUMIN 4.3   No results for input(s): LIPASE, AMYLASE in the last 168 hours. No results for input(s): AMMONIA in the last 168 hours. CBC: Recent Labs  Lab 01/16/20 0844 01/16/20 1301 01/17/20 0542 01/17/20 1428 01/18/20 0416 01/19/20 0714  WBC 3.3* 3.0* 1.6* 1.8* 1.6* 1.3*  NEUTROABS 1.6* 1.5  --  0.5* 0.4*  --   HGB 15.7 15.1 13.4 13.1 12.8* 13.3  HCT 43.2 41.9 37.9* 36.6* 36.5* 38.2*  MCV 85.5 89 89.8 88.4 91.5 91.0  PLT 201 171 143* 146* 149* 156   Cardiac Enzymes: No results for input(s): CKTOTAL, CKMB, CKMBINDEX, TROPONINI in the last 168 hours.  BNP: BNP (last 3 results) No results for input(s): BNP in the last 8760 hours.  ProBNP (last 3 results) No results for input(s): PROBNP in the last 8760 hours.  CBG: No results for input(s): GLUCAP in the last 168 hours.     Signed:  Edsel Petrin  [facility] 01/19/2020, 9:13 AM

## 2020-01-19 LAB — CULTURE, RESPIRATORY W GRAM STAIN: Culture: NORMAL

## 2020-01-19 LAB — BASIC METABOLIC PANEL
Anion gap: 6 (ref 5–15)
BUN: 8 mg/dL (ref 6–20)
CO2: 23 mmol/L (ref 22–32)
Calcium: 8.6 mg/dL — ABNORMAL LOW (ref 8.9–10.3)
Chloride: 109 mmol/L (ref 98–111)
Creatinine, Ser: 1.14 mg/dL (ref 0.61–1.24)
GFR calc Af Amer: >60 mL/min (ref >60)
GFR calc non Af Amer: >60 mL/min (ref >60)
Glucose, Bld: 83 mg/dL (ref 70–99)
Potassium: 4.1 mmol/L (ref 3.5–5.1)
Sodium: 138 mmol/L (ref 135–145)

## 2020-01-19 LAB — CBC
HCT: 38.2 % — ABNORMAL LOW (ref 39.0–52.0)
Hemoglobin: 13.3 g/dL (ref 13.0–17.0)
MCH: 31.7 pg (ref 26.0–34.0)
MCHC: 34.8 g/dL (ref 30.0–36.0)
MCV: 91 fL (ref 80.0–100.0)
Platelets: 156 10*3/uL (ref 150–400)
RBC: 4.2 MIL/uL — ABNORMAL LOW (ref 4.22–5.81)
RDW: 12.3 % (ref 11.5–15.5)
WBC: 1.3 10*3/uL — CL (ref 4.0–10.5)
nRBC: 0 % (ref 0.0–0.2)

## 2020-01-19 NOTE — Progress Notes (Signed)
Discharge instructions reviewed with patient including followup visits and new medications.  Understanding was verbalized and all questions were answered.  IV removed without complication; patient tolerated well.  Patient discharged home ambulatory.

## 2020-01-19 NOTE — Progress Notes (Signed)
CRITICAL VALUE ALERT  Critical Value:  WBC 1.3  Date & Time Notied:  01/19/20 8768  Provider Notified: Edsel Petrin, MD  Orders Received/Actions taken: none

## 2020-01-20 LAB — HIV-1 RNA QUANT-NO REFLEX-BLD
HIV 1 RNA Quant: <20 copies/mL
LOG10 HIV-1 RNA: UNDETERMINED log10copy/mL

## 2020-01-21 LAB — CULTURE, BLOOD (ROUTINE X 2)
Culture: NO GROWTH
Culture: NO GROWTH
Special Requests: ADEQUATE
Special Requests: ADEQUATE

## 2020-01-21 LAB — T.PALLIDUM AB, TOTAL: T Pallidum Abs: REACTIVE — AB

## 2020-01-22 LAB — FUNGITELL, SERUM: Fungitell Result: <31 pg/mL (ref <80)

## 2020-02-11 ENCOUNTER — Emergency Department: Payer: Self-pay

## 2020-02-11 ENCOUNTER — Emergency Department
Admission: EM | Admit: 2020-02-11 | Discharge: 2020-02-11 | Disposition: A | Discharge status: Home / Self Care | Payer: Self-pay | Attending: Emergency Medicine | Admitting: Emergency Medicine

## 2020-02-11 ENCOUNTER — Other Ambulatory Visit: Payer: Self-pay

## 2020-02-11 DIAGNOSIS — F1910 Other psychoactive substance abuse, uncomplicated: Secondary | ICD-10-CM | POA: Insufficient documentation

## 2020-02-11 DIAGNOSIS — F17210 Nicotine dependence, cigarettes, uncomplicated: Secondary | ICD-10-CM | POA: Insufficient documentation

## 2020-02-11 DIAGNOSIS — R531 Weakness: Secondary | ICD-10-CM

## 2020-02-11 DIAGNOSIS — F1590 Other stimulant use, unspecified, uncomplicated: Secondary | ICD-10-CM | POA: Insufficient documentation

## 2020-02-11 DIAGNOSIS — F319 Bipolar disorder, unspecified: Secondary | ICD-10-CM | POA: Insufficient documentation

## 2020-02-11 LAB — CBC
HCT: 42.8 % (ref 39.0–52.0)
Hemoglobin: 14.8 g/dL (ref 13.0–17.0)
MCH: 32.2 pg (ref 26.0–34.0)
MCHC: 34.6 g/dL (ref 30.0–36.0)
MCV: 93 fL (ref 80.0–100.0)
Platelets: 170 10*3/uL (ref 150–400)
RBC: 4.6 MIL/uL (ref 4.22–5.81)
RDW: 13.5 % (ref 11.5–15.5)
WBC: 3 10*3/uL — ABNORMAL LOW (ref 4.0–10.5)
nRBC: 0 % (ref 0.0–0.2)

## 2020-02-11 LAB — BASIC METABOLIC PANEL
Anion gap: 9 (ref 5–15)
BUN: 11 mg/dL (ref 6–20)
CO2: 25 mmol/L (ref 22–32)
Calcium: 9.8 mg/dL (ref 8.9–10.3)
Chloride: 100 mmol/L (ref 98–111)
Creatinine, Ser: 1.27 mg/dL — ABNORMAL HIGH (ref 0.61–1.24)
GFR calc Af Amer: >60 mL/min (ref >60)
GFR calc non Af Amer: >60 mL/min (ref >60)
Glucose, Bld: 120 mg/dL — ABNORMAL HIGH (ref 70–99)
Potassium: 3.3 mmol/L — ABNORMAL LOW (ref 3.5–5.1)
Sodium: 134 mmol/L — ABNORMAL LOW (ref 135–145)

## 2020-02-11 LAB — LACTIC ACID, PLASMA: Lactic Acid, Venous: 1.6 mmol/L (ref 0.5–1.9)

## 2020-02-11 LAB — TROPONIN I (HIGH SENSITIVITY): Troponin I (High Sensitivity): 3 ng/L (ref <18)

## 2020-02-11 MED ORDER — SODIUM CHLORIDE 0.9 % IV BOLUS: 1000.0000 mL | Freq: Once | INTRAVENOUS | Status: DC

## 2020-02-11 MED ORDER — KETOROLAC TROMETHAMINE 30 MG/ML IJ SOLN
15.0000 mg | Freq: Once | INTRAMUSCULAR | Status: AC
  Administered 2020-02-11: 15 mg via INTRAVENOUS
  Filled 2020-02-11: qty 1

## 2020-02-11 MED ORDER — IOHEXOL 350 MG/ML SOLN
75.0000 mL | Freq: Once | INTRAVENOUS | Status: AC | PRN
  Administered 2020-02-11: 75 mL via INTRAVENOUS
  Filled 2020-02-11: qty 75

## 2020-02-11 NOTE — ED Notes (Signed)
Pt unable to get meds filled for pneumonia due to cost.

## 2020-02-11 NOTE — ED Provider Notes (Signed)
[facility] Emergency Department Provider Note  Time seen: 7:13 PM  I have reviewed the triage vital signs and the nursing notes.   HISTORY  Chief Complaint Near Syncope    HPI Dustin Bennett is a 34 y.o. male with a past medical history bipolar, HIV, substance induced psychosis, polysubstance abuse, noncompliance, presents to the emergency department with continued  generalized weakness and cough.  According to the patient about 2 weeks ago he was admitted to the hospital for pneumonia.  Patient states he finished his medications but continues to feel weak at times.  Continues to cough, states he is just "not feeling well."  Denies any significant chest pain.  No abdominal pain.  No vomiting or diarrhea.  Per record review patient was admitted to the hospital a little over 2 weeks ago for approximately 3 days at which time he was diagnosed with pneumonia, respiratory panel was positive for a coronavirus not COVID-19, remainder of his work-up was largely nonrevealing.  Patient is known to be HIV positive with CD4 counts in the 270s upon last admission.  Patient underwent syphilis treatment back in January 2021.  Past Medical History:  Diagnosis Date  . Bipolar 1 disorder (HCC)   . Depression   . HIV (human immunodeficiency virus infection) [facility])     Patient Active Problem List   Diagnosis Date Noted  . Pneumonia 01/16/2020  . Syphilis 08/22/2019  . 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 Depression   . Substance induced mood disorder (HCC) 11/01/2016  . Tobacco use disorder 12/09/2015  . 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 Cannabis use disorder, severe, dependence (HCC) 07/25/2015  . 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 Cocaine use disorder, severe, dependence (HCC) 07/25/2015  . HIV positive (HCC) 07/25/2015    Past Surgical History:  Procedure Laterality Date  .  LUNG BIOPSY      Prior to Admission medications   Medication Sig Start Date End Date Taking? Authorizing Provider  atovaquone (MEPRON) 750 MG/5ML suspension Take 5 mLs (750 mg total) by mouth 2 (two) times daily with a meal. 01/18/20 02/17/20  Mikhail, Maryann, DO  BIKTARVY 50-200-25 MG TABS tablet Take 1 tablet by mouth daily. 01/14/20   [provider]  OLANZapine zydis (ZYPREXA) 5 MG disintegrating tablet Take by mouth. 01/14/20   [provider]    No Known Allergies  History reviewed. No pertinent family history.  Social History Social History   Tobacco Use  . Smoking status: Current Every Day Smoker    Packs/day: 1.00    Types: Cigarettes  . Smokeless tobacco: Never Used  Vaping Use  . Vaping Use: Never used  Substance Use Topics  . Alcohol use: Yes  . Drug use: Yes    Types: Marijuana, Cocaine, Methamphetamines    Review of Systems Constitutional: Subjective fever.  Positive for generalized weakness. Cardiovascular: Negative for chest pain. Respiratory: Positive for shortness of breath and cough. Gastrointestinal: Negative for abdominal pain, vomiting Genitourinary: Negative for urinary compaints Musculoskeletal: Negative for musculoskeletal complaints Skin: Negative for skin complaints  Neurological: Negative for headache All other ROS negative  ____________________________________________   PHYSICAL EXAM:  VITAL SIGNS: ED Triage Vitals  Enc Vitals Group     BP 02/11/20 1729 (!) 158/101     Pulse Rate 02/11/20 1729 (!) 118     Resp 02/11/20 1729 18     Temp 02/11/20 1729 98.2 F (36.8 C)  Temp Source 02/11/20 1729 Oral     SpO2 02/11/20 1729 96 %     Weight 02/11/20 1731 175 lb (79.4 kg)     Height 02/11/20 1731 5\' 11"  (1.803 m)     Head Circumference --      Peak Flow --      Pain Score 02/11/20 1731 10     Pain Loc --      Pain Edu? --      Excl. in GC? --    Constitutional: Alert and oriented. Well appearing and in no  distress. Eyes: Normal exam ENT      Head: Normocephalic and atraumatic.      Mouth/Throat: Mucous membranes are moist. Cardiovascular: Regular rhythm rate around 120.  No obvious murmur. Respiratory: Normal respiratory effort without tachypnea nor retractions. Breath sounds are clear.  No wheeze rales or rhonchi. Gastrointestinal: Soft and nontender. No distention. Musculoskeletal: Nontender with normal range of motion in all extremities.  Neurologic:  Normal speech and language. No gross focal neurologic deficits  Skin:  Skin is warm, dry and intact.  Psychiatric: Mood and affect are normal.   ____________________________________________    EKG  EKG viewed and interpreted by myself shows sinus tachycardia 122 bpm with a narrow QRS, normal axis, normal intervals, nonspecific ST changes.  ____________________________________________    RADIOLOGY  Improved left basilar opacity.  ____________________________________________   INITIAL IMPRESSION / ASSESSMENT AND PLAN / ED COURSE  Pertinent labs & imaging results that were available during my care of the patient were reviewed by me and considered in my medical decision making (see chart for details).   Patient presents emergency department for continued generalized weakness fatigue found to be quite tachycardic around 127.  Patient had extensive work-up performed in the hospital at the end of June which time was diagnosed with pneumonia and treated with antibiotics.  Ultimately antibiotics were discontinued after procalcitonin resulted negative x2 and coronavirus test was positive (not COVID-19).  Patient is known to have HIV with poor medication compliance, RPR was positive but underwent syphilis treatment in January.  Patient being tachycardic with continued shortness of breath.  We will proceed with CTA to rule out PE and better evaluate patient's left-sided pneumonia.  Reassuringly patient's lab work thus far is largely  normal/baseline for patient including a negative troponin.  Since pulse rate is improved currently in the 90s.  Patient is much more calm and states he feels much better.  CTA does not appear to show any significant abnormality patient continues to several borderline lymph nodes however just recovered from a viral pneumonia 2 weeks ago.  Otherwise patient appears well we will discharge home.  Dustin Bennett was evaluated in Emergency Department on 02/11/2020 for the symptoms described in the history of present illness. He was evaluated in the context of the global COVID-19 pandemic, which necessitated consideration that the patient might be at risk for infection with the SARS-CoV-2 virus that causes COVID-19. Institutional protocols and algorithms that pertain to the evaluation of patients at risk for COVID-19 are in a state of rapid change based on information released by regulatory bodies including the CDC and federal and state organizations. These policies and algorithms were followed during the patient's care in the ED.  ____________________________________________   FINAL CLINICAL IMPRESSION(S) / ED DIAGNOSES  Weakness   02/13/2020, MD 02/11/20 2057

## 2020-02-11 NOTE — ED Triage Notes (Signed)
Pt here via [facility] from home with c/o weakness. Pt was in hospital for about 2 weeks ago for pneumonia. VSS

## 2020-02-26 ENCOUNTER — Emergency Department
Admission: EM | Admit: 2020-02-26 | Discharge: 2020-02-26 | Disposition: A | Discharge status: Home / Self Care | Payer: Self-pay | Attending: Emergency Medicine | Admitting: Emergency Medicine

## 2020-02-26 ENCOUNTER — Other Ambulatory Visit: Payer: Self-pay

## 2020-02-26 ENCOUNTER — Emergency Department: Payer: Self-pay

## 2020-02-26 ENCOUNTER — Encounter: Payer: Self-pay | Admitting: Emergency Medicine

## 2020-02-26 DIAGNOSIS — F1290 Cannabis use, unspecified, uncomplicated: Secondary | ICD-10-CM | POA: Insufficient documentation

## 2020-02-26 DIAGNOSIS — F1910 Other psychoactive substance abuse, uncomplicated: Secondary | ICD-10-CM | POA: Insufficient documentation

## 2020-02-26 DIAGNOSIS — J209 Acute bronchitis, unspecified: Secondary | ICD-10-CM | POA: Insufficient documentation

## 2020-02-26 DIAGNOSIS — Z20822 Contact with and (suspected) exposure to covid-19: Secondary | ICD-10-CM | POA: Insufficient documentation

## 2020-02-26 DIAGNOSIS — Z7951 Long term (current) use of inhaled steroids: Secondary | ICD-10-CM | POA: Insufficient documentation

## 2020-02-26 DIAGNOSIS — F1420 Cocaine dependence, uncomplicated: Secondary | ICD-10-CM | POA: Insufficient documentation

## 2020-02-26 DIAGNOSIS — F17210 Nicotine dependence, cigarettes, uncomplicated: Secondary | ICD-10-CM | POA: Insufficient documentation

## 2020-02-26 LAB — CBC WITH DIFFERENTIAL/PLATELET
Abs Immature Granulocytes: 0.01 10*3/uL (ref 0.00–0.07)
Basophils Absolute: 0 10*3/uL (ref 0.0–0.1)
Basophils Relative: 1 %
Eosinophils Absolute: 0.1 10*3/uL (ref 0.0–0.5)
Eosinophils Relative: 2 %
HCT: 42.2 % (ref 39.0–52.0)
Hemoglobin: 15.3 g/dL (ref 13.0–17.0)
Immature Granulocytes: 0 %
Lymphocytes Relative: 24 %
Lymphs Abs: 0.8 10*3/uL (ref 0.7–4.0)
MCH: 32.7 pg (ref 26.0–34.0)
MCHC: 36.3 g/dL — ABNORMAL HIGH (ref 30.0–36.0)
MCV: 90.2 fL (ref 80.0–100.0)
Monocytes Absolute: 0.6 10*3/uL (ref 0.1–1.0)
Monocytes Relative: 18 %
Neutro Abs: 1.9 10*3/uL (ref 1.7–7.7)
Neutrophils Relative %: 55 %
Platelets: 213 10*3/uL (ref 150–400)
RBC: 4.68 MIL/uL (ref 4.22–5.81)
RDW: 13.3 % (ref 11.5–15.5)
WBC: 3.4 10*3/uL — ABNORMAL LOW (ref 4.0–10.5)
nRBC: 0 % (ref 0.0–0.2)

## 2020-02-26 LAB — LIPASE, BLOOD: Lipase: 19 U/L (ref 11–51)

## 2020-02-26 LAB — URINALYSIS, COMPLETE (UACMP) WITH MICROSCOPIC
Bacteria, UA: NONE SEEN
Bilirubin Urine: NEGATIVE
Glucose, UA: NEGATIVE mg/dL
Hgb urine dipstick: NEGATIVE
Ketones, ur: 5 mg/dL — AB
Leukocytes,Ua: NEGATIVE
Nitrite: NEGATIVE
Protein, ur: NEGATIVE mg/dL
Specific Gravity, Urine: 1.027 (ref 1.005–1.030)
pH: 5 (ref 5.0–8.0)

## 2020-02-26 LAB — URINE DRUG SCREEN, QUALITATIVE (ARMC ONLY)
Amphetamines, Ur Screen: POSITIVE — AB
Barbiturates, Ur Screen: NOT DETECTED
Benzodiazepine, Ur Scrn: NOT DETECTED
Cannabinoid 50 Ng, Ur ~~LOC~~: POSITIVE — AB
Cocaine Metabolite,Ur ~~LOC~~: POSITIVE — AB
MDMA (Ecstasy)Ur Screen: NOT DETECTED
Methadone Scn, Ur: NOT DETECTED
Opiate, Ur Screen: NOT DETECTED
Phencyclidine (PCP) Ur S: NOT DETECTED
Tricyclic, Ur Screen: NOT DETECTED

## 2020-02-26 LAB — COMPREHENSIVE METABOLIC PANEL
ALT: 17 U/L (ref 0–44)
AST: 37 U/L (ref 15–41)
Albumin: 4.9 g/dL (ref 3.5–5.0)
Alkaline Phosphatase: 51 U/L (ref 38–126)
Anion gap: 11 (ref 5–15)
BUN: 19 mg/dL (ref 6–20)
CO2: 26 mmol/L (ref 22–32)
Calcium: 9.7 mg/dL (ref 8.9–10.3)
Chloride: 97 mmol/L — ABNORMAL LOW (ref 98–111)
Creatinine, Ser: 1.22 mg/dL (ref 0.61–1.24)
GFR calc Af Amer: >60 mL/min (ref >60)
GFR calc non Af Amer: >60 mL/min (ref >60)
Glucose, Bld: 89 mg/dL (ref 70–99)
Potassium: 3.3 mmol/L — ABNORMAL LOW (ref 3.5–5.1)
Sodium: 134 mmol/L — ABNORMAL LOW (ref 135–145)
Total Bilirubin: 1.2 mg/dL (ref 0.3–1.2)
Total Protein: 8.4 g/dL — ABNORMAL HIGH (ref 6.5–8.1)

## 2020-02-26 LAB — SARS CORONAVIRUS 2 BY RT PCR (HOSPITAL ORDER, PERFORMED IN ~~LOC~~ HOSPITAL LAB): SARS Coronavirus 2: NEGATIVE

## 2020-02-26 MED ORDER — IPRATROPIUM-ALBUTEROL 0.5-2.5 (3) MG/3ML IN SOLN
3.0000 mL | Freq: Once | RESPIRATORY_TRACT | Status: AC
  Administered 2020-02-26: 3 mL via RESPIRATORY_TRACT
  Filled 2020-02-26: qty 3

## 2020-02-26 MED ORDER — ALBUTEROL SULFATE HFA 108 (90 BASE) MCG/ACT IN AERS
2.0000 | INHALATION_SPRAY | Freq: Four times a day (QID) | RESPIRATORY_TRACT | 2 refills | Status: DC | PRN
Start: 2020-02-26 — End: 2023-12-26

## 2020-02-26 MED ORDER — PREDNISONE 10 MG (21) PO TBPK
ORAL_TABLET | ORAL | 0 refills | Status: DC
Start: 2020-02-26 — End: 2023-12-26

## 2020-02-26 MED ORDER — AZITHROMYCIN 250 MG PO TABS
ORAL_TABLET | ORAL | 0 refills | Status: DC
Start: 2020-02-26 — End: 2023-12-26

## 2020-02-26 NOTE — Discharge Instructions (Signed)
Follow-up with your regular doctor if not improving in 2 to 3 days.  Return emergency department if worsening.  Drink plenty of fluids.  Start taking your regular medications for HIV/bipolar.  Called the social worker if you need help with housing and addiction.

## 2020-02-26 NOTE — ED Provider Notes (Signed)
[facility] Emergency Department Provider Note  ____________________________________________   First MD Initiated Contact with Patient 02/26/20 1044     (approximate)  I have reviewed the triage vital signs and the nursing notes.   HISTORY  Chief Complaint Abdominal Pain    HPI Dustin Bennett is a 34 y.o. male with history of HIV and bipolar disease along with substance abuse history that presents to the emergency department with complaints of abdominal cramping.  Patient has not eaten in 2 days because he has been on a "binge ".  Patient's had a cough.  Some headache.  He denies any fever chills.  Is not been on his HIV medication in at least 2 weeks.  Unsure of his CD4 count and viral load.  He denies any vomiting or diarrhea.  Is stating that he feels little better since he got here versus when he came in. denies si/hi   Past Medical History:  Diagnosis Date  . Bipolar 1 disorder (HCC)   . Depression   . HIV (human immunodeficiency virus infection) [facility])     Patient Active Problem List   Diagnosis Date Noted  . Pneumonia 01/16/2020  . Syphilis 08/22/2019  . 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 Depression   . Substance induced mood disorder (HCC) 11/01/2016  . Tobacco use disorder 12/09/2015  . 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 Cannabis use disorder, severe, dependence (HCC) 07/25/2015  . 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 Cocaine use disorder, severe, dependence (HCC) 07/25/2015  . HIV positive (HCC) 07/25/2015    Past Surgical History:  Procedure Laterality Date  . LUNG BIOPSY      Prior to Admission medications   Medication Sig Start Date End Date Taking? Authorizing Provider  albuterol (VENTOLIN HFA) 108 (90 Base) MCG/ACT inhaler Inhale 2 puffs into the lungs every 6 (six) hours as needed for wheezing or shortness of breath. 02/26/20    Sherrie Mustache Roselyn Bering, PA-C  azithromycin (ZITHROMAX Z-PAK) 250 MG tablet 2 pills today then 1 pill a day for 4 days 02/26/20   Faythe Ghee, PA-C  BIKTARVY 50-200-25 MG TABS tablet Take 1 tablet by mouth daily. 01/14/20   [provider]  OLANZapine zydis (ZYPREXA) 5 MG disintegrating tablet Take by mouth. 01/14/20   [provider]  predniSONE (STERAPRED UNI-PAK 21 TAB) 10 MG (21) TBPK tablet Take 6 pills on day one then decrease by 1 pill each day 02/26/20   Faythe Ghee, PA-C    Allergies Patient has no known allergies.  No family history on file.  Social History Social History   Tobacco Use  . Smoking status: Current Every Day Smoker    Packs/day: 1.00    Types: Cigarettes  . Smokeless tobacco: Never Used  Vaping Use  . Vaping Use: Never used  Substance Use Topics  . Alcohol use: Yes  . Drug use: Yes    Types: Marijuana, Cocaine, Methamphetamines    Review of Systems  Constitutional: No fever/chills Eyes: No visual changes. ENT: No sore throat. Respiratory: Positive cough Cardiovascular: Denies chest pain Gastrointestinal: Positive abdominal pain Genitourinary: Negative for dysuria. Musculoskeletal: Negative for back pain. Skin: Negative for rash. Psychiatric: no mood changes,     ____________________________________________   PHYSICAL EXAM:  VITAL SIGNS: ED Triage Vitals [02/26/20 0552]  Enc Vitals Group     BP 120/75     Pulse Rate 94     Resp 18  Temp 98.9 F (37.2 C)     Temp Source Oral     SpO2      Weight 175 lb (79.4 kg)     Height 5\' 11"  (1.803 m)     Head Circumference      Peak Flow      Pain Score 7     Pain Loc      Pain Edu?      Excl. in GC?     Constitutional: Alert and oriented. Well appearing and in no acute distress. Eyes: Conjunctivae are normal.  Head: Atraumatic. Nose: No congestion/rhinnorhea. Mouth/Throat: Mucous membranes are moist.   Neck:  supple no lymphadenopathy noted Cardiovascular: Normal  rate, regular rhythm. Heart sounds are normal Respiratory: Normal respiratory effort.  No retractions, lungs wheezing bilaterally Abd: soft nontender bs normal all 4 quad GU: deferred Musculoskeletal: FROM all extremities, warm and well perfused Neurologic:  Normal speech and language.  Skin:  Skin is warm, dry and intact. No rash noted. Psychiatric: Mood and affect are normal. Speech and behavior are normal.  ____________________________________________   LABS (all labs ordered are listed, but only abnormal results are displayed)  Labs Reviewed  CBC WITH DIFFERENTIAL/PLATELET - Abnormal; Notable for the following components:      Result Value   WBC 3.4 (*)    MCHC 36.3 (*)    All other components within normal limits  COMPREHENSIVE METABOLIC PANEL - Abnormal; Notable for the following components:   Sodium 134 (*)    Potassium 3.3 (*)    Chloride 97 (*)    Total Protein 8.4 (*)    All other components within normal limits  URINALYSIS, COMPLETE (UACMP) WITH MICROSCOPIC - Abnormal; Notable for the following components:   Color, Urine YELLOW (*)    APPearance CLEAR (*)    Ketones, ur 5 (*)    All other components within normal limits  URINE DRUG SCREEN, QUALITATIVE ([facility] ONLY) - Abnormal; Notable for the following components:   Amphetamines, Ur Screen POSITIVE (*)    Cocaine Metabolite,Ur [place] POSITIVE (*)    Cannabinoid 50 Ng, Ur Dustin POSITIVE (*)    All other components within normal limits  SARS CORONAVIRUS 2 BY RT PCR (HOSPITAL ORDER, PERFORMED IN [facility])  LIPASE, BLOOD   ____________________________________________   ____________________________________________  RADIOLOGY  Chest x-ray is negative  ____________________________________________   PROCEDURES  Procedure(s) performed: No  Procedures    ____________________________________________   INITIAL IMPRESSION / ASSESSMENT AND PLAN / ED COURSE  Pertinent labs & imaging results that were  available during my care of the patient were reviewed by me and considered in my medical decision making (see chart for details).   Patient is 34 year old HIV-positive patient with history of drug abuse that presents emergency department after 2-day to 3-day binge.  Patient's been doing cocaine and marijuana.  Is complaining of lower abdominal pain.  Also has had a cough and headache.  Physical exam shows patient to appear well.  Vitals normal.  He is afebrile.  Lungs with crackles and wheezing bilaterally.  Decreased air movement.  Abdomen is nontender.  DDx: PCP pneumonia, acute bronchitis, Covid, drug abuse  CBC has decreased WBC of 3.4 which may be typical of his HIV status, comprehensive metabolic panel is basically normal, lipase normal, urinalysis normal, Covid and UDS are pending  Chest x-ray is normal Covid test is negative Urine drug screen shows cocaine, amphetamines, and cannabinoids  Patient was given a DuoNeb to help with the  wheezing.  Does have better air movement after the DuoNeb.  I explained all the findings to the patient.  He was given a prescription for Z-Pak, Sterapred, and albuterol inhaler.  He is to follow-up with his regular doctor and go back on his meds for his HIV and bipolar.  He was offered to consult with social work which he states he does not want at this time.  He was discharged in stable condition and instructed to return if worsening.  I do feel the abdominal pain was strictly due to the fact that he had not eaten in 2 days.  He is denying any abdominal pain at discharge after he has eaten.     Dustin Bennett was evaluated in Emergency Department on 02/26/2020 for the symptoms described in the history of present illness. He was evaluated in the context of the global COVID-19 pandemic, which necessitated consideration that the patient might be at risk for infection with the SARS-CoV-2 virus that causes COVID-19. Institutional protocols and algorithms that  pertain to the evaluation of patients at risk for COVID-19 are in a state of rapid change based on information released by regulatory bodies including the CDC and federal and state organizations. These policies and algorithms were followed during the patient's care in the ED.    As part of my medical decision making, I reviewed the following data within the electronic MEDICAL RECORD NUMBER History obtained from family, Nursing notes reviewed and incorporated, Labs reviewed , Old chart reviewed, Radiograph reviewed , Notes from prior ED visits and [place] Controlled Substance Database  ____________________________________________   FINAL CLINICAL IMPRESSION(S) / ED DIAGNOSES  Final diagnoses:  Acute bronchitis, unspecified organism  Substance abuse (HCC)      NEW MEDICATIONS STARTED DURING THIS VISIT:  Discharge Medication List as of 02/26/2020  1:02 PM    START taking these medications   Details  albuterol (VENTOLIN HFA) 108 (90 Base) MCG/ACT inhaler Inhale 2 puffs into the lungs every 6 (six) hours as needed for wheezing or shortness of breath., Starting Tue 02/26/2020, Normal    azithromycin (ZITHROMAX Z-PAK) 250 MG tablet 2 pills today then 1 pill a day for 4 days, Normal    predniSONE (STERAPRED UNI-PAK 21 TAB) 10 MG (21) TBPK tablet Take 6 pills on day one then decrease by 1 pill each day, Normal         Note:  This document was prepared using Dragon voice recognition software and may include unintentional dictation errors.    Faythe Ghee, PA-C 02/26/20 1435    Gilles Chiquito, MD 02/26/20 1524

## 2020-02-26 NOTE — ED Triage Notes (Signed)
Pt to triage via w/c with no distress noted; pt reports awoke with mid lower abd pain with no accomp symptoms

## 2020-02-26 NOTE — ED Notes (Signed)
See triage note  Presents with some abd cramping  States he was feeling light headed when he got here  But feeling some better  No fever,no n/v  States he has not been taking his meds or taking care of himself

## 2023-12-25 ENCOUNTER — Emergency Department
Admission: EM | Admit: 2023-12-25 | Discharge: 2023-12-26 | Disposition: A | Discharge status: Other Acute Inpatient Hospital | Payer: Self-pay | Attending: Emergency Medicine | Admitting: Emergency Medicine

## 2023-12-25 ENCOUNTER — Other Ambulatory Visit: Payer: Self-pay

## 2023-12-25 ENCOUNTER — Emergency Department: Payer: Self-pay

## 2023-12-25 DIAGNOSIS — R000 Tachycardia, unspecified: Secondary | ICD-10-CM | POA: Insufficient documentation

## 2023-12-25 DIAGNOSIS — R0789 Other chest pain: Secondary | ICD-10-CM | POA: Insufficient documentation

## 2023-12-25 DIAGNOSIS — F310 Bipolar disorder, current episode hypomanic: Secondary | ICD-10-CM | POA: Insufficient documentation

## 2023-12-25 DIAGNOSIS — F1910 Other psychoactive substance abuse, uncomplicated: Secondary | ICD-10-CM | POA: Insufficient documentation

## 2023-12-25 DIAGNOSIS — F909 Attention-deficit hyperactivity disorder, unspecified type: Secondary | ICD-10-CM | POA: Insufficient documentation

## 2023-12-25 DIAGNOSIS — Z21 Asymptomatic human immunodeficiency virus [HIV] infection status: Secondary | ICD-10-CM | POA: Insufficient documentation

## 2023-12-25 LAB — BASIC METABOLIC PANEL WITH GFR
Anion gap: 11 (ref 5–15)
BUN: 12 mg/dL (ref 6–20)
CO2: 19 mmol/L — ABNORMAL LOW (ref 22–32)
Calcium: 9.5 mg/dL (ref 8.9–10.3)
Chloride: 102 mmol/L (ref 98–111)
Creatinine, Ser: 1.22 mg/dL (ref 0.61–1.24)
GFR, Estimated: >60 mL/min (ref >60)
Glucose, Bld: 96 mg/dL (ref 70–99)
Potassium: 3 mmol/L — ABNORMAL LOW (ref 3.5–5.1)
Sodium: 132 mmol/L — ABNORMAL LOW (ref 135–145)

## 2023-12-25 LAB — CBC
HCT: 41 % (ref 39.0–52.0)
Hemoglobin: 14.2 g/dL (ref 13.0–17.0)
MCH: 30.4 pg (ref 26.0–34.0)
MCHC: 34.6 g/dL (ref 30.0–36.0)
MCV: 87.8 fL (ref 80.0–100.0)
Platelets: 194 10*3/uL (ref 150–400)
RBC: 4.67 MIL/uL (ref 4.22–5.81)
RDW: 12.7 % (ref 11.5–15.5)
WBC: 6 10*3/uL (ref 4.0–10.5)
nRBC: 0 % (ref 0.0–0.2)

## 2023-12-25 LAB — ETHANOL: Alcohol, Ethyl (B): <15 mg/dL (ref <15)

## 2023-12-25 LAB — TSH: TSH: 2.484 u[IU]/mL (ref 0.350–4.500)

## 2023-12-25 LAB — TROPONIN I (HIGH SENSITIVITY): Troponin I (High Sensitivity): 7 ng/L (ref <18)

## 2023-12-25 MED ORDER — SODIUM CHLORIDE 0.9 % IV BOLUS
1000.0000 mL | Freq: Once | INTRAVENOUS | Status: AC
  Administered 2023-12-25: 1000 mL via INTRAVENOUS

## 2023-12-25 MED ORDER — ONDANSETRON HCL 4 MG PO TABS: 4.0000 mg | ORAL_TABLET | Freq: Three times a day (TID) | ORAL | Status: DC | PRN

## 2023-12-25 MED ORDER — IBUPROFEN 600 MG PO TABS
600.0000 mg | ORAL_TABLET | Freq: Three times a day (TID) | ORAL | Status: DC | PRN
Start: 2023-12-25 — End: 2023-12-26

## 2023-12-25 MED ORDER — PANTOPRAZOLE SODIUM 40 MG IV SOLR
40.0000 mg | Freq: Once | INTRAVENOUS | Status: AC
  Administered 2023-12-25: 40 mg via INTRAVENOUS
  Filled 2023-12-25: qty 10

## 2023-12-25 MED ORDER — DIPHENHYDRAMINE HCL 50 MG/ML IJ SOLN
25.0000 mg | INTRAMUSCULAR | Status: AC
  Administered 2023-12-25: 25 mg via INTRAVENOUS
  Filled 2023-12-25: qty 1

## 2023-12-25 MED ORDER — ALUM & MAG HYDROXIDE-SIMETH 200-200-20 MG/5ML PO SUSP: 30.0000 mL | Freq: Four times a day (QID) | ORAL | Status: DC | PRN

## 2023-12-25 MED ORDER — HALOPERIDOL LACTATE 5 MG/ML IJ SOLN
5.0000 mg | Freq: Once | INTRAMUSCULAR | Status: AC
  Administered 2023-12-25: 5 mg via INTRAVENOUS
  Filled 2023-12-25: qty 1
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
## 2023-12-25 NOTE — BH Assessment (Signed)
 This Clinical research associate contacted [facility] via phone to request assessment, request has been made, assessment is currently pending

## 2023-12-25 NOTE — ED Notes (Signed)
 Pt refused to provide a urine specimen states it is illegal but willingly provided blood specimens

## 2023-12-25 NOTE — ED Triage Notes (Signed)
 Pt arrived from home via POV c/o chest pain described as tightness 10/10  that began about an hour ago accompanied with sob and diaphoresis. Pt states hx HTN, not has not been able to get his medication for approx a month
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
## 2023-12-25 NOTE — ED Notes (Addendum)
 Note entered under incorrect Set designer; removed for this reason.

## 2023-12-25 NOTE — ED Notes (Signed)
 [employer] officer at bedside taking pt's statement.
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
## 2023-12-25 NOTE — ED Notes (Addendum)
 PT to xray

## 2023-12-25 NOTE — ED Notes (Signed)
 Pt is unwilling to provide a urine sample or to have vitals monitored.

## 2023-12-26 ENCOUNTER — Inpatient Hospital Stay
Admission: AD | Admit: 2023-12-26 | Discharge: 2023-12-30 | DRG: 885 | Disposition: A | Discharge status: Home / Self Care | Payer: 59 | Source: Intra-hospital | Attending: Psychiatry | Admitting: Psychiatry

## 2023-12-26 ENCOUNTER — Encounter: Payer: Self-pay | Admitting: Psychiatry

## 2023-12-26 DIAGNOSIS — F329 Major depressive disorder, single episode, unspecified: Principal | ICD-10-CM | POA: Insufficient documentation

## 2023-12-26 DIAGNOSIS — Z91148 Patient's other noncompliance with medication regimen for other reason: Secondary | ICD-10-CM | POA: Diagnosis not present

## 2023-12-26 DIAGNOSIS — Z79899 Other long term (current) drug therapy: Secondary | ICD-10-CM

## 2023-12-26 DIAGNOSIS — F909 Attention-deficit hyperactivity disorder, unspecified type: Secondary | ICD-10-CM | POA: Diagnosis present

## 2023-12-26 DIAGNOSIS — F319 Bipolar disorder, unspecified: Principal | ICD-10-CM | POA: Diagnosis present

## 2023-12-26 DIAGNOSIS — F17210 Nicotine dependence, cigarettes, uncomplicated: Secondary | ICD-10-CM | POA: Diagnosis present

## 2023-12-26 DIAGNOSIS — Z888 Allergy status to other drugs, medicaments and biological substances status: Secondary | ICD-10-CM | POA: Diagnosis not present

## 2023-12-26 DIAGNOSIS — F419 Anxiety disorder, unspecified: Secondary | ICD-10-CM | POA: Diagnosis present

## 2023-12-26 DIAGNOSIS — Z21 Asymptomatic human immunodeficiency virus [HIV] infection status: Secondary | ICD-10-CM | POA: Diagnosis present

## 2023-12-26 DIAGNOSIS — F3130 Bipolar disorder, current episode depressed, mild or moderate severity, unspecified: Secondary | ICD-10-CM | POA: Diagnosis not present

## 2023-12-26 LAB — TROPONIN I (HIGH SENSITIVITY): Troponin I (High Sensitivity): 8 ng/L (ref <18)

## 2023-12-26 MED ORDER — HALOPERIDOL 5 MG PO TABS: 5.0000 mg | ORAL_TABLET | Freq: Three times a day (TID) | ORAL | Status: DC | PRN

## 2023-12-26 MED ORDER — DIPHENHYDRAMINE HCL 50 MG/ML IJ SOLN: 50.0000 mg | Freq: Three times a day (TID) | INTRAMUSCULAR | Status: DC | PRN

## 2023-12-26 MED ORDER — LORAZEPAM 2 MG/ML IJ SOLN: 2.0000 mg | Freq: Three times a day (TID) | INTRAMUSCULAR | Status: DC | PRN

## 2023-12-26 MED ORDER — HALOPERIDOL LACTATE 5 MG/ML IJ SOLN: 5.0000 mg | Freq: Three times a day (TID) | INTRAMUSCULAR | Status: DC | PRN

## 2023-12-26 MED ORDER — ACETAMINOPHEN 325 MG PO TABS: 650.0000 mg | ORAL_TABLET | Freq: Four times a day (QID) | ORAL | Status: DC | PRN

## 2023-12-26 MED ORDER — MAGNESIUM HYDROXIDE 400 MG/5ML PO SUSP: 30.0000 mL | Freq: Every day | ORAL | Status: DC | PRN

## 2023-12-26 MED ORDER — HALOPERIDOL LACTATE 5 MG/ML IJ SOLN: 10.0000 mg | Freq: Three times a day (TID) | INTRAMUSCULAR | Status: DC | PRN

## 2023-12-26 MED ORDER — DIPHENHYDRAMINE HCL 25 MG PO CAPS: 50.0000 mg | ORAL_CAPSULE | Freq: Three times a day (TID) | ORAL | Status: DC | PRN

## 2023-12-26 NOTE — BH Assessment (Signed)
 Attempted to assess patient with Psyc NP but patient is unable to stay awake during the assessment, Psyc team to follow
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
## 2023-12-26 NOTE — Tx Team (Signed)
 Initial Treatment Plan 12/26/2023 10:29 PM Dustin Bennett [id number]:[id number]    PATIENT STRESSORS: Substance abuse   Traumatic event     PATIENT STRENGTHS: Ability for insight  Active sense of humor    PATIENT IDENTIFIED PROBLEMS: SI                     DISCHARGE CRITERIA:  Ability to meet basic life and health needs  PRELIMINARY DISCHARGE PLAN: Attend aftercare/continuing care group  PATIENT/FAMILY INVOLVEMENT: This treatment plan has been presented to and reviewed with the patient, Dustin Bennett. The patient and family have been given the opportunity to ask questions and make suggestions.  Jeanmarie Millet, RN 12/26/2023, 10:29 PM

## 2023-12-26 NOTE — ED Notes (Signed)
 This RN attempted to call report-informed receiving nurses are giving medications and unable to take report at this time.

## 2023-12-26 NOTE — ED Notes (Signed)
 Pt given a lunch tray

## 2023-12-26 NOTE — ED Notes (Signed)
 Psych team to bedside but pt to sleepy to complete psych assessment.

## 2023-12-26 NOTE — ED Notes (Signed)
 TTS Cart at the bedside. PT currently speaking with NP.

## 2023-12-26 NOTE — ED Notes (Signed)
 Pt dressed out into hospital scrubs from hospital gown.   Pt stated he had placed all his clothing and wallet into the trash. Trash had been emptied out of room and dirty utility. Charge nurse will be notified of situation.   Only belonging jelly ring. Placed in pt belonging bag.

## 2023-12-26 NOTE — BH Assessment (Signed)
 [facility] consult has been placed for this patient to be seen.

## 2023-12-26 NOTE — ED Notes (Signed)
 Patient given a breakfast tray.

## 2023-12-26 NOTE — ED Notes (Signed)
VOL  GOING TO  BEH MED  TONIGHT 

## 2023-12-26 NOTE — ED Notes (Signed)
Vol /psych consult pending 

## 2023-12-26 NOTE — BH Assessment (Signed)
 Patient is to be admitted to [facility] BM[facility] tonight 12/26/23 after 8:00pm by Dr. Jadapalle.  Attending Physician will be Dr. Jadpalle.   Patient has been assigned to room 322, by [facility] Charge Nurse Zaira.    ER staff is aware of the admission: Edwina Gram, ER Secretary   Dr. Felipe Horton, ER MD  Vet, Patient's Nurse

## 2023-12-26 NOTE — BH Assessment (Signed)
 Comprehensive Clinical Assessment (CCA) Screening, Triage and Referral Note  12/26/2023 Dustin Bennett [id number]  Dustin Bennett, 38 year old male who presents to [facility] ED voluntarily for treatment. Per triage note, Pt arrived from home via POV c/o chest pain described as tightness 10/10  that began about an hour ago accompanied with sob and diaphoresis. Pt states hx HTN, not has not been able to get his medication for approx a month.   During TTS assessment pt presents alert and oriented x 4, restless but cooperative, and mood-congruent with affect. The pt does not appear to be responding to internal or external stimuli. Neither is the pt presenting with any delusional thinking. Pt verified the information provided to triage RN.   Pt identifies his main complaint to be that he came to the ED to get started back on his medications that he has been off for about 1 month. Patient reports his depressive symptoms have worsened. Patient states he has been feeling paranoid and currently experiencing suicidal thoughts. Patient denies any past attempts at harming himself.  Pt reports INPT hx about 2-3 months ago but is not being followed by an outpatient provider at this time. Patient lives at home with his mom and is not aware of any family hx with MH. Pt denies current HI/AH/VH. Pt is not able to contract for safety. Patient states he believes inpatient admission would be beneficial for him. "I want to start my meds, so I won't be tripping out."     Per Romelle Clutter, NP([facility],) pt is recommended for inpatient psychiatric admission.    Chief Complaint:  Chief Complaint  Patient presents with   Chest Pain   Visit Diagnosis: Bipolar  Patient Reported Information How did you hear about us ? Self  What Is the Reason for Your Visit/Call Today? Patient came to the ED to get started back on his medications. Patient reports he has been experiencing suicidal thoughts and paranoia.  How Long Has This Been  Causing You Problems? 1-6 months  What Do You Feel Would Help You the Most Today? Treatment for Depression or other mood problem; Medication(s)   Have You Recently Had Any Thoughts About Hurting Yourself? Yes  Are You Planning to Commit Suicide/Harm Yourself At This time? No   Have you Recently Had Thoughts About Hurting Someone Marigene Shoulder? No  Are You Planning to Harm Someone at This Time? No  Explanation: No data recorded  Have You Used Any Alcohol or Drugs in the Past 24 Hours? No data recorded How Long Ago Did You Use Drugs or Alcohol? No data recorded What Did You Use and How Much? No data recorded  Do You Currently Have a Therapist/Psychiatrist? No  Name of Therapist/Psychiatrist: No data recorded  Have You Been Recently Discharged From Any Office Practice or Programs? No  Explanation of Discharge From Practice/Program: No data recorded   CCA Screening Triage Referral Assessment Type of Contact: Face-to-Face  Telemedicine Service Delivery:   Is this Initial or Reassessment?   Date Telepsych consult ordered in [facility]:    Time Telepsych consult ordered in [facility]:    Location of Assessment: [facility] ED  Provider Location: [facility] ED    Collateral Involvement: None provided   Does Patient Have a Court Appointed Legal Guardian? No data recorded Name and Contact of Legal Guardian: No data recorded If Minor and Not Living with Parent(s), Who has Custody? No data recorded Is CPS involved or ever been involved? No data recorded Is APS involved or ever  been involved? No data recorded  Patient Determined To Be At Risk for Harm To Self or Others Based on Review of Patient Reported Information or Presenting Complaint? Yes, for Self-Harm  Method: No Plan  Availability of Means: No access or NA  Intent: Vague intent or NA  Notification Required: No need or identified person  Additional Information for Danger to Others Potential: No data recorded Additional Comments for Danger to  Others Potential: No data recorded Are There Guns or Other Weapons in Your Home? No data recorded Types of Guns/Weapons: No data recorded Are These Weapons Safely Secured?                            No data recorded Who Could Verify You Are Able To Have These Secured: No data recorded Do You Have any Outstanding Charges, Pending Court Dates, Parole/Probation? No data recorded Contacted To Inform of Risk of Harm To Self or Others: No data recorded  Does Patient Present under Involuntary Commitment? No    [place] of Residence: [place]   Patient Currently Receiving the Following Services: Not Receiving Services   Determination of Need: Emergent (2 hours)   Options For Referral: ED Visit; Inpatient Hospitalization; Medication Management   Disposition Recommendation per psychiatric provider: Per Romelle Clutter, NP([facility],) pt is recommended for inpatient psychiatric admission.  Tra Wilemon Willian Harrow, Counselor, LCAS-A

## 2023-12-26 NOTE — ED Notes (Signed)
Snack given to pt.

## 2023-12-26 NOTE — ED Notes (Signed)
 This RN attempted to call report- informed to call back in 1 hour due to situation occurring on the unit.

## 2023-12-26 NOTE — ED Notes (Signed)
 Pt up to bathroom to urinate. Pt back in bed.

## 2023-12-26 NOTE — Progress Notes (Signed)
 Psych team attempted assessment of the patient. It was noted that the patient is sleeping and when aroused for evaluation is unable to maintain consciousness for further evaluation.

## 2023-12-27 DIAGNOSIS — F329 Major depressive disorder, single episode, unspecified: Secondary | ICD-10-CM | POA: Diagnosis not present

## 2023-12-27 LAB — URINE DRUG SCREEN, QUALITATIVE (ARMC ONLY)
Amphetamines, Ur Screen: POSITIVE — AB
Barbiturates, Ur Screen: NOT DETECTED
Benzodiazepine, Ur Scrn: NOT DETECTED
Cannabinoid 50 Ng, Ur ~~LOC~~: POSITIVE — AB
Cocaine Metabolite,Ur ~~LOC~~: POSITIVE — AB
MDMA (Ecstasy)Ur Screen: NOT DETECTED
Methadone Scn, Ur: NOT DETECTED
Opiate, Ur Screen: NOT DETECTED
Phencyclidine (PCP) Ur S: NOT DETECTED
Tricyclic, Ur Screen: NOT DETECTED

## 2023-12-27 LAB — CHLAMYDIA/NGC RT PCR (ARMC ONLY)
Chlamydia Tr: NOT DETECTED
N gonorrhoeae: NOT DETECTED

## 2023-12-27 MED ORDER — OLANZAPINE 5 MG PO TBDP
5.0000 mg | ORAL_TABLET | Freq: Two times a day (BID) | ORAL | Status: DC
  Administered 2023-12-27 – 2023-12-30 (×6): 5 mg via ORAL
  Filled 2023-12-27 (×6): qty 1

## 2023-12-27 MED ORDER — QUETIAPINE FUMARATE 100 MG PO TABS: 100.0000 mg | ORAL_TABLET | Freq: Every day | ORAL | Status: DC

## 2023-12-27 NOTE — [facility] Suicide Risk Assessment (Signed)
 [facility] INPATIENT:  Family/Significant Other Suicide Prevention Education  Suicide Prevention Education:  Education Completed; Trey Gulbranson, mother, [phone] has been identified by the patient as the family member/significant other with whom the patient will be residing, and identified as the person(s) who will aid the patient in the event of a mental health crisis (suicidal ideations/suicide attempt).  With written consent from the patient, the family member/significant other has been provided the following suicide prevention education, prior to the and/or following the discharge of the patient.  The suicide prevention education provided includes the following: Suicide risk factors Suicide prevention and interventions National Suicide Hotline telephone number [facility] assessment telephone number [facility] Emergency Assistance 911 [facility] and/or Residential Mobile Crisis Unit telephone number  Request made of family/significant other to: Remove weapons (e.g., guns, rifles, knives), all items previously/currently identified as safety concern.   Remove drugs/medications (over-the-counter, prescriptions, illicit drugs), all items previously/currently identified as a safety concern.  The family member/significant other verbalizes understanding of the suicide prevention education information provided.  The family member/significant other agrees to remove the items of safety concern listed above.  Larri Ply 12/27/2023, 3:25 PM

## 2023-12-27 NOTE — BHH Suicide Risk Assessment (Signed)
 [facility] Admission Suicide Risk Assessment   Nursing information obtained from:  Patient Demographic factors:  Male Current Mental Status:  Suicidal ideation indicated by patient Loss Factors:  Decrease in vocational status Historical Factors:  Prior suicide attempts Risk Reduction Factors:  NA  Total Time spent with patient: 1 hour Principal Problem: MDD (major depressive disorder) Diagnosis:  Principal Problem:   MDD (major depressive disorder)  Subjective Data: 38 y/o male previous dx of Bipolar disorder, adhd, and derpession. Also note HIV diagnosis. Notes initially presented for depression and SI x2 weeks specifically in the context of increased substance use. notes increased marijuana and cocaine. Reports he just moved back from [facility] after five years and has been off of his meds for approximately a month. Denies current SI/HI/AVH  Continued Clinical Symptoms:  Alcohol Use Disorder Identification Test Final Score (AUDIT): 0 The "Alcohol Use Disorders Identification Test", Guidelines for Use in Primary Care, Second Edition.  World Science writer [facility]). Score between 0-7:  no or low risk or alcohol related problems. Score between 8-15:  moderate risk of alcohol related problems. Score between 16-19:  high risk of alcohol related problems. Score 20 or above:  warrants further diagnostic evaluation for alcohol dependence and treatment.   CLINICAL FACTORS:   Bipolar Disorder:   Depressive phase Alcohol/Substance Abuse/Dependencies   Musculoskeletal: Strength & Muscle Tone: within normal limits Gait & Station: normal Patient leans: N/A  Psychiatric Specialty Exam:  Presentation  General Appearance:  Casual  Eye Contact: Fair  Speech: Clear and Coherent  Speech Volume: Normal  Handedness:No data recorded  Mood and Affect  Mood: Euthymic  Affect: Congruent   Thought Process  Thought Processes: Coherent  Descriptions of  Associations:Intact  Orientation:Full (Time, Place and Person)  Thought Content:No data recorded History of Schizophrenia/Schizoaffective disorder:No  Duration of Psychotic Symptoms:No data recorded Hallucinations:Hallucinations: None  Ideas of Reference:None  Suicidal Thoughts:Suicidal Thoughts: No  Homicidal Thoughts:Homicidal Thoughts: No   Sensorium  Memory: Immediate Fair; Recent Fair  Judgment: Fair  Insight: Fair   Art therapist  Concentration: Good  Attention Span: Good  Recall: Good  Fund of Knowledge: Fair  Language: Good   Psychomotor Activity  Psychomotor Activity: Psychomotor Activity: Normal   Assets  Assets: Communication Skills; Desire for Improvement; Social Support; Housing   Sleep  Sleep: Sleep: Fair    Physical Exam: Physical Exam Vitals and nursing note reviewed.  HENT:     Head: Atraumatic.  Eyes:     Extraocular Movements: Extraocular movements intact.  Pulmonary:     Effort: Pulmonary effort is normal.  Neurological:     Mental Status: He is alert and oriented to person, place, and time.  Psychiatric:        Behavior: Behavior normal.    Review of Systems  Constitutional:  Negative for chills and fever.  Genitourinary:  Positive for frequency. Negative for dysuria, flank pain, hematuria and urgency.  Psychiatric/Behavioral:  Positive for depression and substance abuse. Negative for hallucinations and suicidal ideas. The patient is nervous/anxious.    Blood pressure 125/74, pulse 78, temperature 97.8 F (36.6 C), temperature source Oral, resp. rate 18, height 5\' 11"  (1.803 m), weight 95 kg, SpO2 99%. Body mass index is 29.21 kg/m.   COGNITIVE FEATURES THAT CONTRIBUTE TO RISK:  None    SUICIDE RISK:   Mild:  Suicidal ideation of limited frequency, intensity, duration, and specificity.  There are no identifiable plans, no associated intent, mild dysphoria and related symptoms, good self-control (both  objective  and subjective assessment), few other risk factors, and identifiable protective factors, including available and accessible social support.  PLAN OF CARE:  Safety and Monitoring:   --  Voluntary admission to inpatient psychiatric unit for safety, stabilization and treatment -- Daily contact with patient to assess and evaluate symptoms and progress in treatment -- Patient's case to be discussed in multi-disciplinary team meeting -- Observation Level : q15 minute checks -- Vital signs:  q12 hours -- Precautions: suicide   I certify that inpatient services furnished can reasonably be expected to improve the patient's condition.   Fay Hoop, PA-C 12/27/2023, 9:34 PM

## 2023-12-27 NOTE — Progress Notes (Signed)
   12/27/23 2120  Psych Admission Type (Psych Patients Only)  Admission Status Voluntary  Psychosocial Assessment  Patient Complaints None  Eye Contact Fair  Facial Expression Animated  Affect Appropriate to circumstance  Speech Soft  Interaction Assertive  Motor Activity Slow  Appearance/Hygiene In scrubs  Behavior Characteristics Cooperative;Appropriate to situation;Calm  Mood Pleasant  Thought Process  Coherency WDL  Content WDL  Delusions WDL  Perception WDL  Hallucination None reported or observed  Judgment WDL  Confusion WDL  Danger to Self  Current suicidal ideation? Denies  Danger to Others  Danger to Others None reported or observed

## 2023-12-27 NOTE — Plan of Care (Signed)
   Problem: Education: Goal: Knowledge of [facility] Education information/materials will improve Outcome: Progressing Goal: Emotional status will improve Outcome: Progressing Goal: Mental status will improve Outcome: Progressing Goal: Verbalization of understanding the information provided will improve Outcome: Progressing

## 2023-12-27 NOTE — Plan of Care (Signed)
   Problem: Education: Goal: Knowledge of [facility] Education information/materials will improve Outcome: Progressing Goal: Emotional status will improve Outcome: Progressing Goal: Mental status will improve Outcome: Progressing

## 2023-12-27 NOTE — Group Note (Signed)
 Recreation Therapy Group Note   Group Topic:Other  Group Date: 12/27/2023 Start Time: 1500 End Time: 1600 Facilitators: Deatrice Factor, LRT, CTRS Location: Courtyard  Group Description: [employer]. LRT and patients played games of basketball, drew with chalk, and played corn hole while outside in the courtyard while getting fresh air and sunlight. Music was being played in the background. LRT and peers conversed about different games they have played before, what they do in their free time and anything else that is on their minds. LRT encouraged pts to drink water after being outside, sweating and getting their heart rate up.  Goal Area(s) Addressed: Patient will build on frustration tolerance skills. Patients will partake in a competitive play game with peers. Patients will gain knowledge of new leisure interest/hobby.    Affect/Mood: N/A   Participation Level: Did not attend    Clinical Observations/Individualized Feedback: Patient did not attend group.   Plan: Continue to engage patient in RT group sessions 2-3x/week.   Deatrice Factor, LRT, CTRS 12/27/2023 4:41 PM

## 2023-12-27 NOTE — BH Assessment (Signed)
.  Admission Note:  38 yr male who presents vol no acute distress for the treatment of SI. Pt appears depressed. Pt was calm and cooperative with admission process. Pt presents with  states he is currently having si thoughts but denies a plan  Pt denies HI and AVH.Patient states he is a cocaine and [place] user. Skin was assessed. PT searched and no contraband found, POC and unit policies explained and understanding verbalized. Consents obtained. Food and fluids offered, and fluids accepted. Pt had no additional questions or concerns

## 2023-12-27 NOTE — Group Note (Signed)
 Date:  12/27/2023 Time:  8:36 PM  Group Topic/Focus:  Wrap-Up Group:   The focus of this group is to help patients review their daily goal of treatment and discuss progress on daily workbooks.    Participation Level:  Did Not Attend  Participation Quality:  NONE  Affect:  NONE  Cognitive:  NONE  Insight: None  Engagement in Group:  NONE  Modes of Intervention:  NONE  Additional Comments:  NONE  Fabiola Holy 12/27/2023, 8:36 PM

## 2023-12-27 NOTE — Group Note (Signed)
 [employer] LCSW Group Therapy Note   Group Date: 12/27/2023 Start Time: 1300 End Time: 1400  Type of Therapy/Topic:  Group Therapy:  Feelings about Diagnosis  Participation Level:  Active   Description of Group:    This group will allow patients to explore their thoughts and feelings about diagnoses they have received. Patients will be guided to explore their level of understanding and acceptance of these diagnoses. Facilitator will encourage patients to process their thoughts and feelings about the reactions of others to their diagnosis, and will guide patients in identifying ways to discuss their diagnosis with significant others in their lives. This group will be process-oriented, with patients participating in exploration of their own experiences as well as giving and receiving support and challenge from other group members.   Therapeutic Goals: 1. Patient will demonstrate understanding of diagnosis as evidence by identifying two or more symptoms of the disorder:  2. Patient will be able to express two feelings regarding the diagnosis 3. Patient will demonstrate ability to communicate their needs through discussion and/or role plays  Summary of Patient Progress: Patient was present in group.  Patient was an active and supportive group member.  Patient was able to discuss the negative and positive aspects of knowing your mental health diagnosis.  Patient was able to provide other group members with appropriate feedback.  Patient was engaged and displayed fair insight.    Therapeutic Modalities:   Cognitive Behavioral Therapy Brief Therapy Feelings Identification    Larri Ply, LCSW

## 2023-12-27 NOTE — Progress Notes (Signed)
   12/27/23 1400  Psych Admission Type (Psych Patients Only)  Admission Status Voluntary  Psychosocial Assessment  Patient Complaints Anxiety  Eye Contact Brief  Facial Expression Flat  Affect Flat  Speech Soft  Interaction Minimal;Isolative  Motor Activity Slow  Appearance/Hygiene In scrubs  Behavior Characteristics Cooperative  Mood Depressed;Anxious  Thought Process  Coherency WDL  Content WDL  Delusions WDL  Perception WDL  Hallucination None reported or observed  Judgment Impaired  Confusion WDL  Danger to Self  Current suicidal ideation? Denies  Danger to Others  Danger to Others None reported or observed

## 2023-12-27 NOTE — Group Note (Signed)
 Date:  12/27/2023 Time:  6:07 PM  Group Topic/Focus:  Activity Group: The focus of this group is to promote activity for the patients and to encourage them to go outside to the courtyard and get some fresh air and some exercise.    Participation Level:  Did Not Attend   Dustin Bennett [name] 12/27/2023, 6:07 PM
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
## 2023-12-27 NOTE — Group Note (Signed)
 Recreation Therapy Group Note   Group Topic:Coping Skills  Group Date: 12/27/2023 Start Time: 1000 End Time: 1050 Facilitators: Deatrice Factor, LRT, CTRS Location: Craft Room  Group Description: Mind Map.  Patient was provided a blank template of a diagram with 32 blank boxes in a tiered system, branching from the center (similar to a bubble chart). LRT directed patients to label the middle of the diagram "Coping Skills". LRT and patients then came up with 8 different coping skills as examples. Pt were directed to record their coping skills in the 2nd tier boxes closest to the center.  Patients would then share their coping skills with the group as LRT wrote them out. LRT gave a handout of 99 different coping skills at the end of group.   Goal Area(s) Addressed: Patients will be able to define "coping skills". Patient will identify new coping skills.  Patient will increase communication.   Affect/Mood: N/A   Participation Level: Did not attend    Clinical Observations/Individualized Feedback: Patient did not attend group.   Plan: Continue to engage patient in RT group sessions 2-3x/week.   Deatrice Factor, LRT, CTRS 12/27/2023 11:06 AM

## 2023-12-27 NOTE — BHH Counselor (Signed)
 CSW spoke with patient's mother, Miracle Criado, [phone].  Mother reports that she was unsure of patient's admission.  Mother reports that she would like to take the patient to [facility] for his aftercare and medications.   She reports pt is not a danger to self or others.    She reports that pt is not a danger to self or others.    She requests assistance in getting patient established with a PCP as well as mental health provider.   Shasta Deist, MSW, LCSW 12/27/2023 3:34 PM

## 2023-12-27 NOTE — H&P (Signed)
 Psychiatric Admission Assessment Adult  Patient Identification: Dustin Bennett MRN:  [id number] Date of Evaluation:  12/27/2023 Chief Complaint: Bipolar disorder  History of Present Illness:   Notes previous dx of Bipolar disorder, adhd, and derpession. Also note HIV diagnosis. Notes initially presented for depression and SI x2 weeks following stopping his medication 4 weeks ago due to running out and then relocating back to [place]. Reports increased substance use during this period of time specifically cocaine and marijuana. Consents to UDS today notes he was "high" when he came in yesterday and refused. Also denies the allergies he report for seroquel  and zyprexa  reports he tolerates well and does not remember why he said these things yesterday. He was at a facility in [place] that helped him get a plane ticket home and restarted on his meds, but that they gave him the insufficient and or incorrect meds.  Notes was previously taking 10 mg of zyprexa  in the morning and in the afternoon. He was also taking seroquel  200 mg at night. States he had taken both for several years together and this is the best combination he has found. He has tried just taking Seroquel  multiple times a day and did not work. Previously taken as much as 800 mg of seroquel  nightly.. No SI/HI and AVH. Reports he wants to get back on his meds so his mood will be stable and that he can get a job. Plan is to live with mom at discharge. Has appt to get new SSN card on 12/30/23. He is linear, logical, and future oriented. Is not interested in treatment for substance use at this time. Notes some urinary frequency and request STD test. No signs of mania/psychosis on exam. Wants to continue taking HIV medications and plans to establish with a PCP on discharge to continue this medication. Extensive pt education was provided on why dual antipsychotic therapy is not advised, pt is agreeable to proceeding with only zyprexa , discussed antidepressant and  or continuing with just the seroquel  and pt will consider.   Total Time spent with patient: 1 hour Sleep  Sleep:Sleep: Fair  Past Psychiatric History:  Psychiatric History:  Information collected from patient and chart review  Prev Dx/Sx: see above  Current Psych Provider: none Home Meds (current): none Previous Med Trials: see above  Therapy: see above  Prior Psych Hospitalization: notes 4 different hosp in [place] most recently at [facility] in [place] at the end of April.   Prior Self Harm: none reported Prior Violence: none  Family Psych History: none Family Hx suicide: none  Social History:  Developmental Hx: ADHD previously was on adderall and ritalin Educational Hx: 10 th grade got ged Occupational Hx: Hotel manager work previously  Armed forces operational officer Hx: None Living Situation: with Mother Spiritual Hx: Pension scheme manager to weapons/lethal means: no   Substance History Alcohol: 2-3 x weel  Type of alcohol 3 12oz beers Last Drink Day prior to arrival  Number of drinks per day not daily drinker History of alcohol withdrawal seizures none History of DT's none Tobacco: vape and smoke declines patch Illicit drugs: Marijuana 3-4x week and cocaine 3-4 x week some meth use once a week. States has been hanging out with dealer.  Prescription drug abuse: none Rehab hx: 4x not interested at present Is the patient at risk to self? No.  Has the patient been a risk to self in the past 6 months? No.  Has the patient been a risk to self within the  distant past? No.  Is the patient a risk to others? No.  Has the patient been a risk to others in the past 6 months? No.  Has the patient been a risk to others within the distant past? No.   [place] Scale:  Flowsheet Row Admission (Current) from 12/26/2023 in [facility] INPATIENT BEHAV[facility]AL MEDICINE ED from 12/25/2023 in [facility] Emergency Department at [facility]  C-SSRS RISK CATEGORY No Risk No Risk        Past Medical  History:  Past Medical History:  Diagnosis Date   Bipolar 1 disorder (HCC)    Depression    HIV (human immunodeficiency virus infection) (HCC)     Past Surgical History:  Procedure Laterality Date   LUNG BIOPSY     Family History: History reviewed. No pertinent family history.  Social History:  Social History   Substance and Sexual Activity  Alcohol Use Yes     Social History   Substance and Sexual Activity  Drug Use Yes   Types: Marijuana, Cocaine, Methamphetamines      Allergies:   Allergies  Allergen Reactions   Seroquel  [Quetiapine ] Hives   Wellbutrin [Bupropion] Hives   Zyprexa  [Olanzapine ] Hives   Lab Results:  Results for orders placed or performed during the hospital encounter of 12/26/23 (from the past 48 hours)  Chlamydia/NGC rt PCR ([facility] only)     Status: None   Collection Time: 12/27/23  2:02 PM   Specimen: Urine; Genital  Result Value Ref Range   Specimen source GC/Chlam URINE, RANDOM    Chlamydia Tr NOT DETECTED NOT DETECTED   N gonorrhoeae NOT DETECTED NOT DETECTED    Comment: (NOTE) This CT/NG assay has not been evaluated in patients with a history of  hysterectomy. Performed at [facility], [address], [place], [place] [address]   Urine Drug Screen, Qualitative [facility] only)     Status: Abnormal   Collection Time: 12/27/23  2:02 PM  Result Value Ref Range   Tricyclic, Ur Screen NONE DETECTED NONE DETECTED   Amphetamines, Ur Screen POSITIVE (A) NONE DETECTED    Comment: (NOTE) Trazodone is metabolized in vivo to several metabolites, including pharmacologically active m-CPP, which is excreted in the urine. Immunoassay screens for amphetamines and MDMA have potential cross-reactivity with these compounds and may provide false positive  results.     MDMA (Ecstasy)Ur Screen NONE DETECTED NONE DETECTED   Cocaine Metabolite,Ur [place] POSITIVE (A) NONE DETECTED   Opiate, Ur Screen NONE DETECTED NONE DETECTED   Phencyclidine (PCP) Ur S  NONE DETECTED NONE DETECTED   Cannabinoid 50 Ng, Ur [place] POSITIVE (A) NONE DETECTED   Barbiturates, Ur Screen NONE DETECTED NONE DETECTED   Benzodiazepine, Ur Scrn NONE DETECTED NONE DETECTED   Methadone Scn, Ur NONE DETECTED NONE DETECTED    Comment: (NOTE) Tricyclics + metabolites, urine    Cutoff 1000 ng/mL Amphetamines + metabolites, urine  Cutoff 1000 ng/mL MDMA (Ecstasy), urine              Cutoff 500 ng/mL Cocaine Metabolite, urine          Cutoff 300 ng/mL Opiate + metabolites, urine        Cutoff 300 ng/mL Phencyclidine (PCP), urine         Cutoff 25 ng/mL Cannabinoid, urine                 Cutoff 50 ng/mL Barbiturates + metabolites, urine  Cutoff 200 ng/mL Benzodiazepine, urine  Cutoff 200 ng/mL Methadone, urine                   Cutoff 300 ng/mL  The urine drug screen provides only a preliminary, unconfirmed analytical test result and should not be used for non-medical purposes. Clinical consideration and professional judgment should be applied to any positive drug screen result due to possible interfering substances. A more specific alternate chemical method must be used in order to obtain a confirmed analytical result. Gas chromatography / mass spectrometry (GC/MS) is the preferred confirm atory method. Performed at [facility], [address] [place], [place] [address]     Blood Alcohol level:  Lab Results  Component Value Date   [facility] <15 12/25/2023   ETH 85 (H) 07/24/2019    Metabolic Disorder Labs:  Lab Results  Component Value Date   HGBA1C 5.1 12/08/2015   Lab Results  Component Value Date   PROLACTIN 11.2 12/08/2015   Lab Results  Component Value Date   CHOL 127 12/08/2015   TRIG 98 12/08/2015   HDL 39 (L) 12/08/2015   CHOLHDL 3.3 12/08/2015   VLDL 20 12/08/2015   LDLCALC 68 12/08/2015    Current Medications: Current Facility-Administered Medications  Medication Dose Route Frequency Provider Last Rate Last Admin    acetaminophen  (TYLENOL ) tablet 650 mg  650 mg Oral Q6H PRN Dorthea Gauze, NP       haloperidol  (HALDOL ) tablet 5 mg  5 mg Oral TID PRN Dorthea Gauze, NP       And   diphenhydrAMINE  (BENADRYL ) capsule 50 mg  50 mg Oral TID PRN Dorthea Gauze, NP       haloperidol  lactate (HALDOL ) injection 5 mg  5 mg Intramuscular TID PRN Dorthea Gauze, NP       And   diphenhydrAMINE  (BENADRYL ) injection 50 mg  50 mg Intramuscular TID PRN Dorthea Gauze, NP       And   LORazepam  (ATIVAN ) injection 2 mg  2 mg Intramuscular TID PRN Dorthea Gauze, NP       haloperidol  lactate (HALDOL ) injection 10 mg  10 mg Intramuscular TID PRN Dorthea Gauze, NP       And   diphenhydrAMINE  (BENADRYL ) injection 50 mg  50 mg Intramuscular TID PRN Dorthea Gauze, NP       And   LORazepam  (ATIVAN ) injection 2 mg  2 mg Intramuscular TID PRN Dorthea Gauze, NP       magnesium  hydroxide (MILK OF MAGNESIA) suspension 30 mL  30 mL Oral Daily PRN Dorthea Gauze, NP       OLANZapine  zydis (ZYPREXA ) disintegrating tablet 5 mg  5 mg Oral BID Reyana Leisey E, PA-C   5 mg at 12/27/23 1709   PTA Medications: Medications Prior to Admission  Medication Sig Dispense Refill Last Dose/Taking   atorvastatin (LIPITOR) 20 MG tablet Take 20 mg by mouth daily.      CABENUVA 600 & 900 MG/3ML injection Inject 1 kit into the muscle every 30 (thirty) days. (Patient not taking: Reported on 12/26/2023)      OLANZapine  zydis (ZYPREXA ) 10 MG disintegrating tablet Take 10 mg by mouth at bedtime.       Psychiatric Specialty Exam:  Presentation  General Appearance: Casual  Eye Contact:Fair  Speech:Clear and Coherent  Speech Volume:Normal    Mood and Affect  Mood:Euthymic  Affect:Congruent   Thought Process  Thought Processes:Coherent  Descriptions of Associations:Intact  Orientation:Full (Time, Place and Person)  Thought Content:No data recorded Hallucinations:Hallucinations: None  Ideas of Reference:None  Suicidal Thoughts:Suicidal  Thoughts: No  Homicidal Thoughts:Homicidal Thoughts: No   Sensorium  Memory:Immediate Fair; Recent Fair  Judgment:Fair  Insight:Fair   Executive Functions  Concentration:Good  Attention Span:Good  Recall:Good  Fund of Knowledge:Fair  Language:Good   Psychomotor Activity  Psychomotor Activity:Psychomotor Activity: Normal   Assets  Assets:Communication Skills; Desire for Improvement; Social Support; Housing    Musculoskeletal: Strength & Muscle Tone: within normal limits Gait & Station: normal  Physical Exam: Physical Exam Vitals and nursing note reviewed.  HENT:     Head: Atraumatic.  Eyes:     Extraocular Movements: Extraocular movements intact.  Pulmonary:     Effort: Pulmonary effort is normal.  Neurological:     Mental Status: He is alert and oriented to person, place, and time.  Psychiatric:        Mood and Affect: Mood normal.        Behavior: Behavior normal.    Review of Systems  Psychiatric/Behavioral:  Positive for depression and substance abuse. Negative for hallucinations and suicidal ideas. The patient is nervous/anxious.    Blood pressure 125/74, pulse 78, temperature 97.8 F (36.6 C), temperature source Oral, resp. rate 18, height 5\' 11"  (1.803 m), weight 95 kg, SpO2 99%. Body mass index is 29.21 kg/m.  Principal Diagnosis: MDD (major depressive disorder) Diagnosis:  Principal Problem:   MDD (major depressive disorder)   Clinical Decision Making:  Treatment Plan Summary:  Safety and Monitoring:             -- Voluntary admission to inpatient psychiatric unit for safety, stabilization and treatment             -- Daily contact with patient to assess and evaluate symptoms and progress in treatment             -- Patient's case to be discussed in multi-disciplinary team meeting             -- Observation Level: q15 minute checks             -- Vital signs:  q12 hours             -- Precautions: suicide, elopement, and assault    2. Psychiatric Diagnoses and Treatment:            Bipolar disorder    Zyprexa  5 mg odt bid    Polysubstance use:  Not seeking treatment at this time   -- The risks/benefits/side-effects/alternatives to this medication were discussed in detail with the patient and time was given for questions. The patient consents to medication trial.                -- Metabolic profile and EKG monitoring obtained while on an atypical antipsychotic (BMI: Lipid Panel: HbgA1c: QTc:)              -- Encouraged patient to participate in unit milieu and in scheduled group therapies                            3. Medical Issues Being Addressed:      4. Discharge Planning:              -- Social work and case management to assist with discharge planning and identification of hospital follow-up needs prior to discharge             -- Estimated LOS: 5-7 days             --  Discharge Concerns: Need to establish a safety plan; Medication compliance and effectiveness             -- Discharge Goals: Return home with outpatient referrals follow ups  -- request d/c by Friday so he can go to [facility] office.   Physician Treatment Plan for Primary Diagnosis: MDD (major depressive disorder) Long Term Goal(s): Improvement in symptoms so as ready for discharge  Short Term Goals: Ability to maintain clinical measurements within normal limits will improve, Compliance with prescribed medications will improve, and Ability to identify triggers associated with substance abuse/mental health issues will improve   I certify that inpatient services furnished can reasonably be expected to improve the patient's condition.    Fay Hoop, PA-C 6/3/20259:45 PM

## 2023-12-28 DIAGNOSIS — F3130 Bipolar disorder, current episode depressed, mild or moderate severity, unspecified: Secondary | ICD-10-CM | POA: Diagnosis not present

## 2023-12-28 DIAGNOSIS — F319 Bipolar disorder, unspecified: Principal | ICD-10-CM | POA: Diagnosis present

## 2023-12-28 MED ORDER — FLUOXETINE HCL 20 MG PO CAPS
20.0000 mg | ORAL_CAPSULE | Freq: Every day | ORAL | Status: DC
  Administered 2023-12-28 – 2023-12-30 (×3): 20 mg via ORAL
  Filled 2023-12-28 (×3): qty 1

## 2023-12-28 NOTE — Progress Notes (Signed)
 [facility] MD Progress Note  12/28/2023 1:29 PM Dustin Bennett  MRN:  [id number]   Subjective:  Chart reviewed, case discussed in multidisciplinary meeting, patient seen during rounds. They are alert and oriented. They are pleasant and cooperative. Deny adverse effects of medications. Note stable mood appetite and sleep. Deny SI/HI/AVH. Reviewed labs with pt. Is agreeable with trial of Prozac.  Depression 2/10 Anxiety 4/10  Sleep: Good  Appetite:  Good  Past Psychiatric History: see h&P Family History: History reviewed. No pertinent family history. Social History:  Social History   Substance and Sexual Activity  Alcohol Use Yes     Social History   Substance and Sexual Activity  Drug Use Yes   Types: Marijuana, Cocaine, Methamphetamines    Social History   Socioeconomic History   Marital status: Single    Spouse name: Not on file   Number of children: Not on file   Years of education: Not on file   Highest education level: Not on file  Occupational History   Not on file  Tobacco Use   Smoking status: Every Day    Current packs/day: 1.00    Types: Cigarettes   Smokeless tobacco: Never  Vaping Use   Vaping status: Never Used  Substance and Sexual Activity   Alcohol use: Yes   Drug use: Yes    Types: Marijuana, Cocaine, Methamphetamines   Sexual activity: Not on file  Other Topics Concern   Not on file  Social History Narrative   Not on file   Social Drivers of Health   Financial Resource Strain: Not on file  Food Insecurity: No Food Insecurity (12/26/2023)   Hunger Vital Sign    Worried About Running Out of Food in the Last Year: Never true    Ran Out of Food in the Last Year: Never true  Transportation Needs: No Transportation Needs (12/26/2023)   PRAPARE - Administrator, Civil Service (Medical): No    Lack of Transportation (Non-Medical): No  Physical Activity: Not on file  Stress: Not on file  Social Connections: Not on file   Past Medical  History:  Past Medical History:  Diagnosis Date   Bipolar 1 disorder (HCC)    Depression    HIV (human immunodeficiency virus infection) (HCC)     Past Surgical History:  Procedure Laterality Date   LUNG BIOPSY      Current Medications: Current Facility-Administered Medications  Medication Dose Route Frequency Provider Last Rate Last Admin   acetaminophen  (TYLENOL ) tablet 650 mg  650 mg Oral Q6H PRN Dorthea Gauze, NP       haloperidol  (HALDOL ) tablet 5 mg  5 mg Oral TID PRN Dorthea Gauze, NP       And   diphenhydrAMINE  (BENADRYL ) capsule 50 mg  50 mg Oral TID PRN Dorthea Gauze, NP       haloperidol  lactate (HALDOL ) injection 5 mg  5 mg Intramuscular TID PRN Dorthea Gauze, NP       And   diphenhydrAMINE  (BENADRYL ) injection 50 mg  50 mg Intramuscular TID PRN Dorthea Gauze, NP       And   LORazepam  (ATIVAN ) injection 2 mg  2 mg Intramuscular TID PRN Dorthea Gauze, NP       haloperidol  lactate (HALDOL ) injection 10 mg  10 mg Intramuscular TID PRN Dorthea Gauze, NP       And   diphenhydrAMINE  (BENADRYL ) injection 50 mg  50 mg Intramuscular TID PRN Dorthea Gauze, NP  And   LORazepam  (ATIVAN ) injection 2 mg  2 mg Intramuscular TID PRN Dorthea Gauze, NP       FLUoxetine (PROZAC) capsule 20 mg  20 mg Oral Daily Perina Salvaggio E, PA-C   20 mg at 12/28/23 1033   magnesium  hydroxide (MILK OF MAGNESIA) suspension 30 mL  30 mL Oral Daily PRN Dorthea Gauze, NP       OLANZapine  zydis (ZYPREXA ) disintegrating tablet 5 mg  5 mg Oral BID Bracha Frankowski E, PA-C   5 mg at 12/28/23 1478    Lab Results:  Results for orders placed or performed during the hospital encounter of 12/26/23 (from the past 48 hours)  Chlamydia/NGC rt PCR ([facility] only)     Status: None   Collection Time: 12/27/23  2:02 PM   Specimen: Urine; Genital  Result Value Ref Range   Specimen source GC/Chlam URINE, RANDOM    Chlamydia Tr NOT DETECTED NOT DETECTED   N gonorrhoeae NOT DETECTED NOT DETECTED    Comment:  (NOTE) This CT/NG assay has not been evaluated in patients with a history of  hysterectomy. Performed at [facility], [address], [place], [place] [address]   Urine Drug Screen, Qualitative [facility] only)     Status: Abnormal   Collection Time: 12/27/23  2:02 PM  Result Value Ref Range   Tricyclic, Ur Screen NONE DETECTED NONE DETECTED   Amphetamines, Ur Screen POSITIVE (A) NONE DETECTED    Comment: (NOTE) Trazodone is metabolized in vivo to several metabolites, including pharmacologically active m-CPP, which is excreted in the urine. Immunoassay screens for amphetamines and MDMA have potential cross-reactivity with these compounds and may provide false positive  results.     MDMA (Ecstasy)Ur Screen NONE DETECTED NONE DETECTED   Cocaine Metabolite,Ur [place] POSITIVE (A) NONE DETECTED   Opiate, Ur Screen NONE DETECTED NONE DETECTED   Phencyclidine (PCP) Ur S NONE DETECTED NONE DETECTED   Cannabinoid 50 Ng, Ur [place] POSITIVE (A) NONE DETECTED   Barbiturates, Ur Screen NONE DETECTED NONE DETECTED   Benzodiazepine, Ur Scrn NONE DETECTED NONE DETECTED   Methadone Scn, Ur NONE DETECTED NONE DETECTED    Comment: (NOTE) Tricyclics + metabolites, urine    Cutoff 1000 ng/mL Amphetamines + metabolites, urine  Cutoff 1000 ng/mL MDMA (Ecstasy), urine              Cutoff 500 ng/mL Cocaine Metabolite, urine          Cutoff 300 ng/mL Opiate + metabolites, urine        Cutoff 300 ng/mL Phencyclidine (PCP), urine         Cutoff 25 ng/mL Cannabinoid, urine                 Cutoff 50 ng/mL Barbiturates + metabolites, urine  Cutoff 200 ng/mL Benzodiazepine, urine              Cutoff 200 ng/mL Methadone, urine                   Cutoff 300 ng/mL  The urine drug screen provides only a preliminary, unconfirmed analytical test result and should not be used for non-medical purposes. Clinical consideration and professional judgment should be applied to any positive drug screen result due to  possible interfering substances. A more specific alternate chemical method must be used in order to obtain a confirmed analytical result. Gas chromatography / mass spectrometry (GC/MS) is the preferred confirm atory method. Performed at [facility], [address]  [place], [place] [address]     Blood Alcohol level:  Lab Results  Component Value Date   ETH <15 12/25/2023   ETH 85 (H) 07/24/2019    Metabolic Disorder Labs: Lab Results  Component Value Date   HGBA1C 5.1 12/08/2015   Lab Results  Component Value Date   PROLACTIN 11.2 12/08/2015   Lab Results  Component Value Date   CHOL 127 12/08/2015   TRIG 98 12/08/2015   HDL 39 (L) 12/08/2015   CHOLHDL 3.3 12/08/2015   VLDL 20 12/08/2015   LDLCALC 68 12/08/2015    Physical Findings: AIMS:  , ,  ,  ,    CIWA:    COWS:      Psychiatric Specialty Exam:  Presentation  General Appearance:  Casual  Eye Contact: Fair  Speech: Clear and Coherent  Speech Volume: Normal    Mood and Affect  Mood: Euthymic  Affect: Congruent   Thought Process  Thought Processes: Coherent  Descriptions of Associations:Intact  Orientation:Full (Time, Place and Person)  Thought Content:Logical  Hallucinations:Hallucinations: None  Ideas of Reference:None  Suicidal Thoughts:Suicidal Thoughts: No  Homicidal Thoughts:Homicidal Thoughts: No   Sensorium  Memory: Immediate Fair; Recent Fair  Judgment: Good  Insight: Good   Executive Functions  Concentration: Good  Attention Span: Good  Recall: Good  Fund of Knowledge: Fair  Language: Good   Psychomotor Activity  Psychomotor Activity: Psychomotor Activity: Normal  Musculoskeletal: Strength & Muscle Tone: within normal limits Gait & Station: normal Assets  Assets: Manufacturing systems engineer; Desire for Improvement    Physical Exam: Physical Exam Vitals and nursing note reviewed.  HENT:     Head: Atraumatic.  Eyes:      Extraocular Movements: Extraocular movements intact.  Pulmonary:     Effort: Pulmonary effort is normal.  Neurological:     Mental Status: He is alert and oriented to person, place, and time.  Psychiatric:        Mood and Affect: Mood normal.        Behavior: Behavior normal.    Review of Systems  Constitutional:  Negative for chills and fever.  Genitourinary:  Negative for dysuria, flank pain, frequency, hematuria and urgency.  Psychiatric/Behavioral:  Positive for depression. Negative for hallucinations, substance abuse and suicidal ideas. The patient is nervous/anxious.    Blood pressure 107/62, pulse 75, temperature 98.1 F (36.7 C), temperature source Oral, resp. rate 18, height 5\' 11"  (1.803 m), weight 95 kg, SpO2 97%. Body mass index is 29.21 kg/m.  Diagnosis: Principal Problem:   MDD (major depressive disorder)   PLAN: Safety and Monitoring:  -- Voluntary admission to inpatient psychiatric unit for safety, stabilization and treatment  -- Daily contact with patient to assess and evaluate symptoms and progress in treatment  -- Patient's case to be discussed in multi-disciplinary team meeting  -- Observation Level : q15 minute checks  -- Vital signs:  q12 hours  -- Precautions: suicide, elopement, and assault -- Encouraged patient to participate in unit milieu and in scheduled group therapies  2. Psychiatric Diagnoses and Treatment:         Bipolar disorder    Zyprexa  5 mg odt bid  Start prozac 20 mg daily     Polysubstance use:   Not seeking treatment at this time   -- The risks/benefits/side-effects/alternatives to this medication were discussed in detail with the patient and time was given for questions. The patient consents to medication trial.                --  Metabolic profile and EKG monitoring obtained while on an atypical antipsychotic (BMI: Lipid Panel: HbgA1c: QTc:)              -- Encouraged patient to participate in unit milieu and in scheduled group  therapies    3. Medical Issues Being Addressed:   G/c was negative uds reviewed.   4. Discharge Planning:   -- Social work and case management to assist with discharge planning and identification of hospital follow-up needs prior to discharge  -- Estimated LOS: 3-4 days  Fay Hoop, PA-C 12/28/2023, 1:29 PM

## 2023-12-28 NOTE — BH IP Treatment Plan (Signed)
 Interdisciplinary Treatment and Diagnostic Plan Update  12/28/2023 Time of Session: 10:37AM Dustin Bennett MRN: [id number]  Principal Diagnosis: MDD (major depressive disorder)  Secondary Diagnoses: Principal Problem:   MDD (major depressive disorder)   Current Medications:  Current Facility-Administered Medications  Medication Dose Route Frequency Provider Last Rate Last Admin   acetaminophen  (TYLENOL ) tablet 650 mg  650 mg Oral Q6H PRN Dorthea Gauze, NP       haloperidol  (HALDOL ) tablet 5 mg  5 mg Oral TID PRN Dorthea Gauze, NP       And   diphenhydrAMINE  (BENADRYL ) capsule 50 mg  50 mg Oral TID PRN Dorthea Gauze, NP       haloperidol  lactate (HALDOL ) injection 5 mg  5 mg Intramuscular TID PRN Dorthea Gauze, NP       And   diphenhydrAMINE  (BENADRYL ) injection 50 mg  50 mg Intramuscular TID PRN Dorthea Gauze, NP       And   LORazepam  (ATIVAN ) injection 2 mg  2 mg Intramuscular TID PRN Dorthea Gauze, NP       haloperidol  lactate (HALDOL ) injection 10 mg  10 mg Intramuscular TID PRN Dorthea Gauze, NP       And   diphenhydrAMINE  (BENADRYL ) injection 50 mg  50 mg Intramuscular TID PRN Dorthea Gauze, NP       And   LORazepam  (ATIVAN ) injection 2 mg  2 mg Intramuscular TID PRN Dorthea Gauze, NP       FLUoxetine (PROZAC) capsule 20 mg  20 mg Oral Daily [name], [name], PA-C   20 mg at 12/28/23 1033   magnesium  hydroxide (MILK OF MAGNESIA) suspension 30 mL  30 mL Oral Daily PRN Dorthea Gauze, NP       OLANZapine  zydis (ZYPREXA ) disintegrating tablet 5 mg  5 mg Oral BID [name], [name], PA-C   5 mg at 12/28/23 0865   PTA Medications: Medications Prior to Admission  Medication Sig Dispense Refill Last Dose/Taking   atorvastatin (LIPITOR) 20 MG tablet Take 20 mg by mouth daily.      CABENUVA 600 & 900 MG/3ML injection Inject 1 kit into the muscle every 30 (thirty) days. (Patient not taking: Reported on 12/26/2023)      OLANZapine  zydis (ZYPREXA ) 10 MG disintegrating tablet Take  10 mg by mouth at bedtime.       Patient Stressors: Substance abuse   Traumatic event    Patient Strengths: Ability for insight  Active sense of humor   Treatment Modalities: Medication Management, Group therapy, Case management,  1 to 1 session with clinician, Psychoeducation, Recreational therapy.   Physician Treatment Plan for Primary Diagnosis: MDD (major depressive disorder) Long Term Goal(s): Improvement in symptoms so as ready for discharge   Short Term Goals: Ability to maintain clinical measurements within normal limits will improve Compliance with prescribed medications will improve Ability to identify triggers associated with substance abuse/mental health issues will improve  Medication Management: Evaluate patient's response, side effects, and tolerance of medication regimen.  Therapeutic Interventions: 1 to 1 sessions, Unit Group sessions and Medication administration.  Evaluation of Outcomes: Progressing  Physician Treatment Plan for Secondary Diagnosis: Principal Problem:   MDD (major depressive disorder)  Long Term Goal(s): Improvement in symptoms so as ready for discharge   Short Term Goals: Ability to maintain clinical measurements within normal limits will improve Compliance with prescribed medications will improve Ability to identify triggers associated with substance abuse/mental health issues will improve     Medication Management: Evaluate patient's response, side  effects, and tolerance of medication regimen.  Therapeutic Interventions: 1 to 1 sessions, Unit Group sessions and Medication administration.  Evaluation of Outcomes: Progressing   RN Treatment Plan for Primary Diagnosis: MDD (major depressive disorder) Long Term Goal(s): Knowledge of disease and therapeutic regimen to maintain health will improve  Short Term Goals: Ability to demonstrate self-control, Ability to participate in decision making will improve, Ability to verbalize feelings  will improve, Ability to disclose and discuss suicidal ideas, Ability to identify and develop effective coping behaviors will improve, and Compliance with prescribed medications will improve  Medication Management: RN will administer medications as ordered by provider, will assess and evaluate patient's response and provide education to patient for prescribed medication. RN will report any adverse and/or side effects to prescribing provider.  Therapeutic Interventions: 1 on 1 counseling sessions, Psychoeducation, Medication administration, Evaluate responses to treatment, Monitor vital signs and CBGs as ordered, Perform/monitor CIWA, COWS, AIMS and Fall Risk screenings as ordered, Perform wound care treatments as ordered.  Evaluation of Outcomes: Progressing   LCSW Treatment Plan for Primary Diagnosis: MDD (major depressive disorder) Long Term Goal(s): Safe transition to appropriate next level of care at discharge, Engage patient in therapeutic group addressing interpersonal concerns.  Short Term Goals: Engage patient in aftercare planning with referrals and resources, Increase social support, Increase ability to appropriately verbalize feelings, Increase emotional regulation, Facilitate acceptance of mental health diagnosis and concerns, Facilitate patient progression through stages of change regarding substance use diagnoses and concerns, Identify triggers associated with mental health/substance abuse issues, and Increase skills for wellness and recovery  Therapeutic Interventions: Assess for all discharge needs, 1 to 1 time with Social worker, Explore available resources and support systems, Assess for adequacy in community support network, Educate family and significant other(s) on suicide prevention, Complete Psychosocial Assessment, Interpersonal group therapy.  Evaluation of Outcomes: Progressing   Progress in Treatment: Attending groups: Yes. Participating in groups: Yes. Taking  medication as prescribed: Yes. Toleration medication: Yes. Family/Significant other contact made: Yes, individual(s) contacted:  SPE completed with the patient's mother.  Patient understands diagnosis: Yes. Discussing patient identified problems/goals with staff: Yes. Medical problems stabilized or resolved: Yes. Denies suicidal/homicidal ideation: Yes. Issues/concerns per patient self-inventory: No. Other: none  New problem(s) identified: No, Describe:  none   New Short Term/Long Term Goal(s): detox, elimination of symptoms of psychosis, medication management for mood stabilization; elimination of SI thoughts; development of comprehensive mental wellness/sobriety plan.   Patient Goals:  "get stable on meds and get connected with a therapist"  Discharge Plan or Barriers: CSW to assist patient in development of appropriate discharge plans.   Reason for Continuation of Hospitalization: Anxiety Depression Medication stabilization Suicidal ideation Withdrawal symptoms  Estimated Length of Stay:  1-7 days  Last 3 [place] Suicide Severity Risk Score: Flowsheet Row Admission (Current) from 12/26/2023 in [facility] INPATIENT BEHAVIORAL MEDICINE ED from 12/25/2023 in [facility] Emergency Department at [facility]  C-SSRS RISK CATEGORY No Risk No Risk       Last PHQ 2/9 Scores:     No data to display          Scribe for Treatment Team: Larri Ply, Buzz Cass 12/28/2023 12:47 PM

## 2023-12-28 NOTE — Plan of Care (Signed)
  Problem: Coping: Goal: Ability to verbalize frustrations and anger appropriately will improve Outcome: Progressing   Problem: Activity: Goal: Interest or engagement in activities will improve Outcome: Not Progressing

## 2023-12-28 NOTE — Progress Notes (Signed)
   12/28/23 0834  Psych Admission Type (Psych Patients Only)  Admission Status Voluntary  Psychosocial Assessment  Patient Complaints Anxiety  Eye Contact Fair  Facial Expression Animated  Affect Appropriate to circumstance  Speech Logical/coherent  Interaction Minimal  Motor Activity Slow  Appearance/Hygiene In scrubs  Behavior Characteristics Cooperative;Calm  Mood Sad;Pleasant  Thought Process  Coherency WDL  Content WDL  Delusions None reported or observed  Perception WDL  Hallucination None reported or observed  Judgment Impaired  Confusion None  Danger to Self  Current suicidal ideation? Denies  Danger to Others  Danger to Others None reported or observed

## 2023-12-28 NOTE — Group Note (Signed)
 [facility] LCSW Group Therapy Note   Group Date: 12/28/2023 Start Time: 1300 End Time: 1400   Type of Therapy and Topic: Group Therapy: Avoiding Self-Sabotaging and Enabling Behaviors  Participation Level: Did Not Attend  Mood:  Description of Group:  In this group, patients will learn how to identify obstacles, self-sabotaging and enabling behaviors, as well as: what are they, why do we do them and what needs these behaviors meet. Discuss unhealthy relationships and how to have positive healthy boundaries with those that sabotage and enable. Explore aspects of self-sabotage and enabling in yourself and how to limit these self-destructive behaviors in everyday life.   Therapeutic Goals: 1. Patient will identify one obstacle that relates to self-sabotage and enabling behaviors 2. Patient will identify one personal self-sabotaging or enabling behavior they did prior to admission 3. Patient will state a plan to change the above identified behavior 4. Patient will demonstrate ability to communicate their needs through discussion and/or role play.    Summary of Patient Progress:   Patient did not attend group.    Therapeutic Modalities:  Cognitive Behavioral Therapy Person-Centered Therapy Motivational Interviewing    Roselle Conner, LCSW

## 2023-12-28 NOTE — BHH Group Notes (Signed)
 Psychoeducational Group Note  Date:  12/28/2023 Time:  2000  Group Topic/Focus:  Wrap up group  Participation Level: Did Not Attend  Participation Quality:  Not Applicable  Affect:  Not Applicable  Cognitive:  Not Applicable  Insight:  Not Applicable  Engagement in Group: Not Applicable  Additional Comments:  Did not attend.  Catharine Clock 12/28/2023, 9:37 PM

## 2023-12-28 NOTE — Group Note (Signed)
 Date:  12/28/2023 Time:  5:51 PM  Group Topic/Focus:  Wellness Toolbox:   The focus of this group is to discuss various aspects of wellness, balancing those aspects and exploring ways to increase the ability to experience wellness.  Patients will create a wellness toolbox for use upon discharge.    Participation Level:  Active  Participation Quality:  Appropriate  Affect:  Appropriate  Cognitive:  Appropriate  Insight: Appropriate  Engagement in Group:  Engaged  Modes of Intervention:  Activity and Socialization  Additional Comments:    Laverne Potter 12/28/2023, 5:51 PM

## 2023-12-28 NOTE — Plan of Care (Signed)
   Problem: Education: Goal: Emotional status will improve Outcome: Progressing Goal: Mental status will improve Outcome: Progressing Goal: Verbalization of understanding the information provided will improve Outcome: Progressing

## 2023-12-28 NOTE — Group Note (Signed)
 Date:  12/28/2023 Time:  11:13 AM  Group Topic/Focus:  Goals Group:   The focus of this group is to help patients establish daily goals to achieve during treatment and discuss how the patient can incorporate goal setting into their daily lives to aide in recovery.    Participation Level:  Did Not Attend   [name] [facility] 12/28/2023, 11:13 AM

## 2023-12-29 ENCOUNTER — Encounter: Payer: Self-pay | Admitting: Psychiatry

## 2023-12-29 DIAGNOSIS — F3130 Bipolar disorder, current episode depressed, mild or moderate severity, unspecified: Secondary | ICD-10-CM | POA: Diagnosis not present

## 2023-12-29 MED ORDER — OLANZAPINE 5 MG PO TBDP
5.0000 mg | ORAL_TABLET | Freq: Two times a day (BID) | ORAL | 0 refills | Status: DC
  Filled 2023-12-29: qty 60, 30d supply, fill #0

## 2023-12-29 MED ORDER — FLUOXETINE HCL 20 MG PO CAPS
20.0000 mg | ORAL_CAPSULE | Freq: Every day | ORAL | 0 refills | Status: DC
  Filled 2023-12-29: qty 30, 30d supply, fill #0

## 2023-12-29 NOTE — BHH Suicide Risk Assessment (Signed)
 [facility] Discharge Suicide Risk Assessment   Principal Problem: Bipolar disorder [facility]) Discharge Diagnoses: Principal Problem:   Bipolar disorder (HCC)   Total Time spent with patient: 30 minutes  Musculoskeletal: Strength & Muscle Tone: within normal limits Gait & Station: normal Patient leans: N/A  Psychiatric Specialty Exam  Presentation  General Appearance:  Casual  Eye Contact: Fair  Speech: Clear and Coherent  Speech Volume: Normal  Handedness:No data recorded  Mood and Affect  Mood: Euthymic  Duration of Depression Symptoms: Greater than two weeks  Affect: Congruent   Thought Process  Thought Processes: Coherent  Descriptions of Associations:Intact  Orientation:Full (Time, Place and Person)  Thought Content:Logical  History of Schizophrenia/Schizoaffective disorder:No  Duration of Psychotic Symptoms:No data recorded Hallucinations:Hallucinations: None  Ideas of Reference:None  Suicidal Thoughts:Suicidal Thoughts: No  Homicidal Thoughts:Homicidal Thoughts: No   Sensorium  Memory: Immediate Fair; Recent Fair  Judgment: Good  Insight: Good   Executive Functions  Concentration: Good  Attention Span: Good  Recall: Good  Fund of Knowledge: Fair  Language: Good   Psychomotor Activity  Psychomotor Activity: Psychomotor Activity: Normal   Assets  Assets: Communication Skills; Desire for Improvement   Sleep  Sleep: Sleep: Fair   Physical Exam: Physical Exam Vitals and nursing note reviewed.  HENT:     Head: Atraumatic.  Eyes:     Extraocular Movements: Extraocular movements intact.  Pulmonary:     Effort: Pulmonary effort is normal.  Neurological:     Mental Status: He is alert and oriented to person, place, and time.  Psychiatric:        Mood and Affect: Mood normal.        Behavior: Behavior normal.        Thought Content: Thought content normal.    Review of Systems  Psychiatric/Behavioral:   Negative for hallucinations, substance abuse and suicidal ideas.    Blood pressure 120/72, pulse (!) 58, temperature 97.9 F (36.6 C), resp. rate 18, height 5\' 11"  (1.803 m), weight 95 kg, SpO2 98%. Body mass index is 29.21 kg/m.  Mental Status Per Nursing Assessment::   On Admission:  Suicidal ideation indicated by patient  Demographic Factors:  Male, Low socioeconomic status, and Unemployed  Loss Factors: NA  Historical Factors: Impulsivity  Risk Reduction Factors:   Living with another person, especially a relative, Positive social support, and Positive coping skills or problem solving skills  Continued Clinical Symptoms:  Alcohol/Substance Abuse/Dependencies  Cognitive Features That Contribute To Risk:  None    Suicide Risk:  Minimal: No identifiable suicidal ideation.  Patients presenting with no risk factors but with morbid ruminations; may be classified as minimal risk based on the severity of the depressive symptoms   Follow-up Information     Ostwalt, Janna, PA-C. Go on 02/23/2024.   Specialty: Physician Assistant Why: You are scheduled to see Blane Bunting, PA-C at [facility] on Thursday, July 31st at 3:20pm. Please bring your ID, Insurance card and any copy with you.  Please give a 24 hour notice if you need to reschedule or cancel your appointment. Thank you.  You have been added to the wait list in the event that a sooner appointment becomes available. Contact information: [address] [place] [address] [phone]         [facility] Follow up.   Why: Appointment is scheduled for 01/03/2024 at 8AM for an assessment. Contact information: [address] [place] [address] [phone]  Plan Of Care/Follow-up recommendations:  # It is recommended to the patient to continue psychiatric medications as prescribed, after discharge from the hospital.   # It is  recommended to the patient to follow up with your outpatient psychiatric provider and PCP. # It was discussed with the patient, the impact of alcohol, drugs, tobacco have been there overall psychiatric and medical wellbeing, and total abstinence from substance use was recommended. # Prescriptions provided or sent directly to preferred pharmacy at discharge. Patient agreeable to plan. Given the opportunity to ask questions. Appears to feel comfortable with discharge.  # In the event of worsening symptoms, the patient is instructed to call the crisis hotline (988), 911 and or go to the nearest ED for appropriate evaluation and treatment of symptoms. To follow-up with primary care provider for other medical issues, concerns and or health care needs # Patient was discharged home as requested with a plan to follow up as noted above.    Fay Hoop, PA-C 12/29/2023, 6:28 PM

## 2023-12-29 NOTE — Progress Notes (Signed)
 [facility] MD Progress Note  12/29/2023 6:19 PM Dustin Bennett  MRN:  [id number]   Subjective:     12/29/23: Chart reviewed, case discussed in multidisciplinary meeting, patient seen during rounds they are found sitting up in bed they are alert and oriented x 3.  They are pleasant and cooperative.  They continue tolerating medications well.  They note stable appetite mood and sleep.  They deny SI, HI, and AVH.  There is depression today at 2 out of 10 and anxiety at 2 out of 10.  Did not require any PRNs.  They have consistently denied SI HI and AVH.  They continue to not wish to seek treatment for substance use however demonstrate good insight into the need for outpatient follow-up for psychiatric medication management and medication compliance.  They also plan to follow-up with PCP for HIV medications they are linear logical and future oriented.  They request discharge tomorrow as they have an appointment at 3 PM to get there so security card so that they can work.  Chart reviewed, case discussed in multidisciplinary meeting, patient seen during rounds. They are alert and oriented. They are pleasant and cooperative. Deny adverse effects of medications. Note stable mood appetite and sleep. Deny SI/HI/AVH. Reviewed labs with pt. Is agreeable with trial of Prozac.  Depression 2/10 Anxiety 4/10  Sleep: Good  Appetite:  Good  Past Psychiatric History: see h&P Family History: History reviewed. No pertinent family history. Social History:  Social History   Substance and Sexual Activity  Alcohol Use Yes     Social History   Substance and Sexual Activity  Drug Use Yes   Types: Marijuana, Cocaine, Methamphetamines    Social History   Socioeconomic History   Marital status: Single    Spouse name: Not on file   Number of children: Not on file   Years of education: Not on file   Highest education level: Not on file  Occupational History   Not on file  Tobacco Use   Smoking status: Every Day     Current packs/day: 1.00    Types: Cigarettes   Smokeless tobacco: Never  Vaping Use   Vaping status: Never Used  Substance and Sexual Activity   Alcohol use: Yes   Drug use: Yes    Types: Marijuana, Cocaine, Methamphetamines   Sexual activity: Not on file  Other Topics Concern   Not on file  Social History Narrative   Not on file   Social Drivers of Health   Financial Resource Strain: Not on file  Food Insecurity: No Food Insecurity (12/26/2023)   Hunger Vital Sign    Worried About Running Out of Food in the Last Year: Never true    Ran Out of Food in the Last Year: Never true  Transportation Needs: No Transportation Needs (12/26/2023)   PRAPARE - Administrator, Civil Service (Medical): No    Lack of Transportation (Non-Medical): No  Physical Activity: Not on file  Stress: Not on file  Social Connections: Not on file   Past Medical History:  Past Medical History:  Diagnosis Date   Bipolar 1 disorder (HCC)    Depression    HIV (human immunodeficiency virus infection) (HCC)     Past Surgical History:  Procedure Laterality Date   LUNG BIOPSY      Current Medications: Current Facility-Administered Medications  Medication Dose Route Frequency Provider Last Rate Last Admin   acetaminophen  (TYLENOL ) tablet 650 mg  650 mg Oral Q6H PRN  Dorthea Gauze, NP       haloperidol  (HALDOL ) tablet 5 mg  5 mg Oral TID PRN Dorthea Gauze, NP       And   diphenhydrAMINE  (BENADRYL ) capsule 50 mg  50 mg Oral TID PRN Dorthea Gauze, NP       haloperidol  lactate (HALDOL ) injection 5 mg  5 mg Intramuscular TID PRN Dorthea Gauze, NP       And   diphenhydrAMINE  (BENADRYL ) injection 50 mg  50 mg Intramuscular TID PRN Dorthea Gauze, NP       And   LORazepam  (ATIVAN ) injection 2 mg  2 mg Intramuscular TID PRN Dorthea Gauze, NP       haloperidol  lactate (HALDOL ) injection 10 mg  10 mg Intramuscular TID PRN Dorthea Gauze, NP       And   diphenhydrAMINE  (BENADRYL ) injection 50 mg  50 mg  Intramuscular TID PRN Dorthea Gauze, NP       And   LORazepam  (ATIVAN ) injection 2 mg  2 mg Intramuscular TID PRN Dorthea Gauze, NP       FLUoxetine (PROZAC) capsule 20 mg  20 mg Oral Daily Krysteena Stalker E, PA-C   20 mg at 12/29/23 0803   magnesium  hydroxide (MILK OF MAGNESIA) suspension 30 mL  30 mL Oral Daily PRN Dorthea Gauze, NP       OLANZapine  zydis (ZYPREXA ) disintegrating tablet 5 mg  5 mg Oral BID Marilu Rylander E, PA-C   5 mg at 12/29/23 1658    Lab Results:  No results found for this or any previous visit (from the past 48 hours).   Blood Alcohol level:  Lab Results  Component Value Date   ETH <15 12/25/2023   ETH 85 (H) 07/24/2019    Metabolic Disorder Labs: Lab Results  Component Value Date   HGBA1C 5.1 12/08/2015   Lab Results  Component Value Date   PROLACTIN 11.2 12/08/2015   Lab Results  Component Value Date   CHOL 127 12/08/2015   TRIG 98 12/08/2015   HDL 39 (L) 12/08/2015   CHOLHDL 3.3 12/08/2015   VLDL 20 12/08/2015   LDLCALC 68 12/08/2015    Physical Findings: AIMS:  , ,  ,  ,    CIWA:    COWS:      Psychiatric Specialty Exam:  Presentation  General Appearance:  Casual  Eye Contact: Fair  Speech: Clear and Coherent  Speech Volume: Normal    Mood and Affect  Mood: Euthymic  Affect: Congruent   Thought Process  Thought Processes: Coherent  Descriptions of Associations:Intact  Orientation:Full (Time, Place and Person)  Thought Content:Logical  Hallucinations:Hallucinations: None  Ideas of Reference:None  Suicidal Thoughts:Suicidal Thoughts: No  Homicidal Thoughts:Homicidal Thoughts: No   Sensorium  Memory: Immediate Fair; Recent Fair  Judgment: Good  Insight: Good   Executive Functions  Concentration: Good  Attention Span: Good  Recall: Good  Fund of Knowledge: Fair  Language: Good   Psychomotor Activity  Psychomotor Activity: Psychomotor Activity:  Normal  Musculoskeletal: Strength & Muscle Tone: within normal limits Gait & Station: normal Assets  Assets: Manufacturing systems engineer; Desire for Improvement    Physical Exam: Physical Exam Vitals and nursing note reviewed.  HENT:     Head: Atraumatic.  Eyes:     Extraocular Movements: Extraocular movements intact.  Pulmonary:     Effort: Pulmonary effort is normal.  Neurological:     Mental Status: He is alert and oriented to person, place, and time.  Psychiatric:  Mood and Affect: Mood normal.        Behavior: Behavior normal.        Thought Content: Thought content normal.    Review of Systems  Constitutional:  Negative for chills and fever.  Genitourinary:  Negative for dysuria, flank pain, frequency, hematuria and urgency.  Psychiatric/Behavioral:  Positive for depression. Negative for hallucinations, substance abuse and suicidal ideas. The patient is not nervous/anxious.    Blood pressure 120/72, pulse (!) 58, temperature 97.9 F (36.6 C), resp. rate 18, height 5\' 11"  (1.803 m), weight 95 kg, SpO2 98%. Body mass index is 29.21 kg/m.  Diagnosis: Principal Problem:   Bipolar disorder (HCC)   PLAN: Safety and Monitoring:  -- Voluntary admission to inpatient psychiatric unit for safety, stabilization and treatment  -- Daily contact with patient to assess and evaluate symptoms and progress in treatment  -- Patient's case to be discussed in multi-disciplinary team meeting  -- Observation Level : q15 minute checks  -- Vital signs:  q12 hours  -- Precautions: suicide, elopement, and assault -- Encouraged patient to participate in unit milieu and in scheduled group therapies  2. Psychiatric Diagnoses and Treatment:         Bipolar disorder    Zyprexa  5 mg odt bid  Start prozac 20 mg daily     Polysubstance use:   Not seeking treatment at this time   -- The risks/benefits/side-effects/alternatives to this medication were discussed in detail with the patient  and time was given for questions. The patient consents to medication trial.                -- Metabolic profile and EKG monitoring obtained while on an atypical antipsychotic (BMI: Lipid Panel: HbgA1c: QTc:)              -- Encouraged patient to participate in unit milieu and in scheduled group therapies    3. Medical Issues Being Addressed:   G/c was negative uds reviewed.   4. Discharge Planning:   -- Social work and case management to assist with discharge planning and identification of hospital follow-up needs prior to discharge  -- Estimated LOS: 3-4 days  -- Discharge to home tomorrow, Social work contacted collateral and are setting up follow up. Will send prescriptions.   Fay Hoop, PA-C 12/29/2023, 6:19 PM

## 2023-12-29 NOTE — Plan of Care (Signed)
  Problem: Education: Goal: Emotional status will improve 12/29/2023 0533 by Orrin Blakes, RN Outcome: Progressing 12/29/2023 0531 by Orrin Blakes, RN Outcome: Progressing Goal: Mental status will improve 12/29/2023 0533 by Orrin Blakes, RN Outcome: Progressing 12/29/2023 0531 by Orrin Blakes, RN Outcome: Progressing Goal: Verbalization of understanding the information provided will improve Outcome: Progressing   Problem: Coping: Goal: Ability to verbalize frustrations and anger appropriately will improve Outcome: Progressing

## 2023-12-29 NOTE — Group Note (Signed)
 Date:  12/29/2023 Time:  8:31 PM  Group Topic/Focus:  Orientation:   The focus of this group is to educate the patient on the purpose and policies of crisis stabilization and provide a format to answer questions about their admission.  The group details unit policies and expectations of patients while admitted.    Participation Level:  did not attend  Participation Quality:  none  Affect:  none  Cognitive:  none  Insight: None  Engagement in Group:  none  Modes of Intervention:  none  Additional Comments:  none   Cheick Suhr 12/29/2023, 8:31 PM

## 2023-12-29 NOTE — Progress Notes (Signed)
   12/29/23 1300  Psych Admission Type (Psych Patients Only)  Admission Status Voluntary  Psychosocial Assessment  Patient Complaints None  Eye Contact Fair  Facial Expression Animated  Affect Appropriate to circumstance  Speech Logical/coherent  Interaction Minimal  Motor Activity Other (Comment) (appropriate for developmental age)  Appearance/Hygiene Unremarkable  Behavior Characteristics Cooperative;Calm  Mood Pleasant;Sad  Thought Process  Coherency WDL  Content WDL  Delusions None reported or observed  Perception WDL  Hallucination None reported or observed  Judgment WDL  Confusion None  Danger to Self  Current suicidal ideation? Denies  Danger to Others  Danger to Others None reported or observed

## 2023-12-29 NOTE — Progress Notes (Signed)
   12/28/23 2100  Psych Admission Type (Psych Patients Only)  Admission Status Voluntary  Psychosocial Assessment  Patient Complaints Anxiety  Eye Contact Fair  Facial Expression Animated  Affect Appropriate to circumstance  Speech Logical/coherent  Interaction Minimal  Motor Activity Slow  Appearance/Hygiene In scrubs  Behavior Characteristics Cooperative;Calm  Mood Sad;Pleasant  Thought Process  Coherency WDL  Content WDL  Delusions None reported or observed  Perception WDL  Hallucination None reported or observed  Judgment Impaired  Confusion None  Danger to Self  Current suicidal ideation? Denies  Danger to Others  Danger to Others None reported or observed

## 2023-12-29 NOTE — Group Note (Signed)
 Recreation Therapy Group Note   Group Topic:Relaxation  Group Date: 12/29/2023 Start Time: 1000 End Time: 1050 Facilitators: Deatrice Factor, LRT, CTRS Location: Craft Room  Group Description: PMR (Progressive Muscle Relaxation). LRT asks patients their current level of stress/anxiety from 1-10, with 10 being the highest. LRT educates patients on what PMR is and the benefits that come from it. Patients are asked to sit with their feet flat on the floor while sitting up and all the way back in their chair, if possible. LRT and pts follow a prompt through a speaker that requires you to tense and release different muscles in their body and focus on their breathing. During session, lights are off and soft music is being played. Pts are given a stress ball to use if needed. At the end of the prompt, LRT asks patients to rank their current levels of stress/anxiety from 1-10, 10 being the highest. LRT provides patients with an education handout on PMR.   Goal Area(s) Addressed:  Patients will be able to describe progressive muscle relaxation.  Patient will practice using relaxation technique. Patient will identify a new coping skill.  Patient will follow multistep directions to reduce anxiety and stress.   Affect/Mood: N/A   Participation Level: Did not attend    Clinical Observations/Individualized Feedback: Patient did not attend group.   Plan: Continue to engage patient in RT group sessions 2-3x/week.   Deatrice Factor, LRT, CTRS 12/29/2023 11:42 AM

## 2023-12-29 NOTE — Group Note (Signed)
 [facility] LCSW Group Therapy Note   Group Date: 12/29/2023 Start Time: 1300 End Time: 1335   Type of Therapy/Topic:  Group Therapy:  Balance in Life  Participation Level:  Did Not Attend   Description of Group:    This group will address the concept of balance and how it feels and looks when one is unbalanced. Patients will be encouraged to process areas in their lives that are out of balance, and identify reasons for remaining unbalanced. Facilitators will guide patients utilizing problem- solving interventions to address and correct the stressor making their life unbalanced. Understanding and applying boundaries will be explored and addressed for obtaining  and maintaining a balanced life. Patients will be encouraged to explore ways to assertively make their unbalanced needs known to significant others in their lives, using other group members and facilitator for support and feedback.  Therapeutic Goals: Patient will identify two or more emotions or situations they have that consume much of in their lives. Patient will identify signs/triggers that life has become out of balance:  Patient will identify two ways to set boundaries in order to achieve balance in their lives:  Patient will demonstrate ability to communicate their needs through discussion and/or role plays  Summary of Patient Progress: Patient did not attend group.    Therapeutic Modalities:   Cognitive Behavioral Therapy Solution-Focused Therapy Assertiveness Training   Randolm Butte, LCSW

## 2023-12-29 NOTE — Group Note (Signed)
 Date:  12/29/2023 Time:  10:14 AM  Group Topic/Focus:   Goals Group:   The focus of this group is to help patients establish daily goals to achieve during treatment and discuss how the patient can incorporate goal setting into their daily lives to aide in recovery.   Participation Level:  Did Not Attend   Mallarie Voorhies A Gracieann Stannard 12/29/2023, 10:14 AM

## 2023-12-29 NOTE — Plan of Care (Signed)
  Problem: Education: Goal: Emotional status will improve Outcome: Progressing Goal: Mental status will improve Outcome: Progressing   Problem: Coping: Goal: Ability to verbalize frustrations and anger appropriately will improve Outcome: Progressing   

## 2023-12-29 NOTE — B[facility] Counselor (Signed)
 CSW attempted to reach the patient's mother, Dustin Bennett, [phone].  CSW left HIPAA compliant voicemail.  Shasta Deist, MSW, LCSW 12/29/2023 2:28 PM

## 2023-12-29 NOTE — Group Note (Signed)
 Recreation Therapy Group Note   Group Topic:Coping Skills  Group Date: 12/29/2023 Start Time: 1530 End Time: 1620 Facilitators: Yvonna Herder, CTRS Location: Craft Room  Group Description: Coping A-Z. LRT and patients engage in a guided discussion on what coping skills are and gave specific examples. LRT passed out a handout labeled Coping A-Z with blank spaces beside each letter. LRT prompted patients to come up with a coping skill for each of the letters. LRT and patients went over the handout and gave ideas for each letter if anyone had any blanks left on their paper. Patients kept this handout with them that listed 26 different coping skills.   Goal Area(s) Addressed: Patients will be able to define "coping skills". Patient will identify new coping skills.  Patient will increase communication.   Affect/Mood: N/A   Participation Level: Did not attend    Clinical Observations/Individualized Feedback: Patient did not attend group.   Plan: Continue to engage patient in RT group sessions 2-3x/week.   Deatrice Factor, LRT, CTRS 12/29/2023 5:25 PM

## 2023-12-29 NOTE — Plan of Care (Signed)

## 2023-12-30 ENCOUNTER — Other Ambulatory Visit: Payer: Self-pay

## 2023-12-30 DIAGNOSIS — F3130 Bipolar disorder, current episode depressed, mild or moderate severity, unspecified: Secondary | ICD-10-CM | POA: Diagnosis not present

## 2023-12-30 LAB — LIPID PANEL
Cholesterol: 163 mg/dL (ref 0–200)
HDL: 53 mg/dL (ref >40)
LDL Cholesterol: 96 mg/dL (ref 0–99)
Total CHOL/HDL Ratio: 3.1 ratio
Triglycerides: 68 mg/dL (ref <150)
VLDL: 14 mg/dL (ref 0–40)

## 2023-12-30 LAB — HEMOGLOBIN A1C
Hgb A1c MFr Bld: 5.5 % (ref 4.8–5.6)
Mean Plasma Glucose: 111.15 mg/dL

## 2023-12-30 MED ORDER — OLANZAPINE 5 MG PO TBDP: 5.0000 mg | ORAL_TABLET | Freq: Two times a day (BID) | ORAL | 0 refills | Status: DC

## 2023-12-30 NOTE — Plan of Care (Signed)
  Problem: Education: Goal: Knowledge of [facility] Education information/materials will improve 12/30/2023 0948 by Derenda Flax, RN Outcome: Adequate for Discharge 12/30/2023 [phone] by Derenda Flax, RN Outcome: Progressing Goal: Emotional status will improve 12/30/2023 0948 by Derenda Flax, RN Outcome: Adequate for Discharge 12/30/2023 [phone] by Derenda Flax, RN Outcome: Progressing Goal: Mental status will improve 12/30/2023 0948 by Derenda Flax, RN Outcome: Adequate for Discharge 12/30/2023 [phone] by Derenda Flax, RN Outcome: Progressing Goal: Verbalization of understanding the information provided will improve 12/30/2023 0948 by Derenda Flax, RN Outcome: Adequate for Discharge 12/30/2023 0948 by Derenda Flax, RN Outcome: Progressing   Problem: Activity: Goal: Interest or engagement in activities will improve 12/30/2023 0948 by Derenda Flax, RN Outcome: Adequate for Discharge 12/30/2023 [phone] by Derenda Flax, RN Outcome: Progressing Goal: Sleeping patterns will improve 12/30/2023 0948 by Derenda Flax, RN Outcome: Adequate for Discharge 12/30/2023 [phone] by Derenda Flax, RN Outcome: Progressing   Problem: Coping: Goal: Ability to verbalize frustrations and anger appropriately will improve 12/30/2023 0948 by Derenda Flax, RN Outcome: Adequate for Discharge 12/30/2023 0948 by Derenda Flax, RN Outcome: Progressing Goal: Ability to demonstrate self-control will improve 12/30/2023 0948 by Derenda Flax, RN Outcome: Adequate for Discharge 12/30/2023 0948 by Derenda Flax, RN Outcome: Progressing   Problem: Health Behavior/Discharge Planning: Goal: Identification of resources available to assist in meeting health care needs will improve 12/30/2023 0948 by Derenda Flax, RN Outcome: Adequate for Discharge 12/30/2023 [phone] by Derenda Flax, RN Outcome: Progressing Goal: Compliance with treatment plan for underlying cause of  condition will improve 12/30/2023 0948 by Derenda Flax, RN Outcome: Adequate for Discharge 12/30/2023 0948 by Derenda Flax, RN Outcome: Progressing   Problem: Physical Regulation: Goal: Ability to maintain clinical measurements within normal limits will improve 12/30/2023 0948 by Derenda Flax, RN Outcome: Adequate for Discharge 12/30/2023 0948 by Derenda Flax, RN Outcome: Progressing   Problem: Safety: Goal: Periods of time without injury will increase 12/30/2023 0948 by Derenda Flax, RN Outcome: Adequate for Discharge 12/30/2023 0948 by Derenda Flax, RN Outcome: Progressing

## 2023-12-30 NOTE — Plan of Care (Signed)
 Dustin Bennett is a 38 y.o. male patient. No diagnosis found. Past Medical History:  Diagnosis Date   Bipolar 1 disorder (HCC)    Depression    HIV (human immunodeficiency virus infection) (HCC)    Current Facility-Administered Medications  Medication Dose Route Frequency Provider Last Rate Last Admin   acetaminophen  (TYLENOL ) tablet 650 mg  650 mg Oral Q6H PRN Dorthea Gauze, NP       haloperidol  (HALDOL ) tablet 5 mg  5 mg Oral TID PRN Dorthea Gauze, NP       And   diphenhydrAMINE  (BENADRYL ) capsule 50 mg  50 mg Oral TID PRN Dorthea Gauze, NP       haloperidol  lactate (HALDOL ) injection 5 mg  5 mg Intramuscular TID PRN Dorthea Gauze, NP       And   diphenhydrAMINE  (BENADRYL ) injection 50 mg  50 mg Intramuscular TID PRN Dorthea Gauze, NP       And   LORazepam  (ATIVAN ) injection 2 mg  2 mg Intramuscular TID PRN Dorthea Gauze, NP       haloperidol  lactate (HALDOL ) injection 10 mg  10 mg Intramuscular TID PRN Dorthea Gauze, NP       And   diphenhydrAMINE  (BENADRYL ) injection 50 mg  50 mg Intramuscular TID PRN Dorthea Gauze, NP       And   LORazepam  (ATIVAN ) injection 2 mg  2 mg Intramuscular TID PRN Dorthea Gauze, NP       FLUoxetine (PROZAC) capsule 20 mg  20 mg Oral Daily [name], [name], PA-C   20 mg at 12/29/23 9147   magnesium  hydroxide (MILK OF MAGNESIA) suspension 30 mL  30 mL Oral Daily PRN Dorthea Gauze, NP       OLANZapine  zydis (ZYPREXA ) disintegrating tablet 5 mg  5 mg Oral BID [name], [name], PA-C   5 mg at 12/29/23 1658   Allergies  Allergen Reactions   Seroquel  [Quetiapine ] Hives   Wellbutrin [Bupropion] Hives   Zyprexa  [Olanzapine ] Hives   Principal Problem:   Bipolar disorder (HCC)  Blood pressure 120/72, pulse (!) 58, temperature 97.9 F (36.6 C), resp. rate 18, height 5\' 11"  (1.803 m), weight 95 kg, SpO2 98%.  P/N: Patient was very isolative but cooperative and pleasant who spoken to in his room. Denied SI & HI.   Dustin Bennett Dustin Bennett 12/30/2023

## 2023-12-30 NOTE — Group Note (Signed)
 Date:  12/30/2023 Time:  10:10 AM  Group Topic/Focus:  Goals Group:   The focus of this group is to help patients establish daily goals to achieve during treatment and discuss how the patient can incorporate goal setting into their daily lives to aide in recovery.   Participation Level:  Did Not Attend   Dustin Bennett [name] 12/30/2023, 10:10 AM

## 2023-12-30 NOTE — B[facility] Counselor (Signed)
 CSW spoke with the patients mother who reports no concerns at patient being discharged.  She reports that she will provide transportation for the patient.  Shasta Deist, MSW, LCSW 12/30/2023 9:17 AM

## 2023-12-30 NOTE — Progress Notes (Signed)
   12/30/23 0757  Psych Admission Type (Psych Patients Only)  Admission Status Voluntary  Psychosocial Assessment  Patient Complaints None  Eye Contact Fair  Facial Expression Animated  Affect Other (Comment) (appropriate)  Speech Logical/coherent  Interaction Assertive  Motor Activity Slow  Appearance/Hygiene Unremarkable  Behavior Characteristics Calm  Mood Pleasant  Thought Process  Coherency WDL  Content WDL  Delusions None reported or observed  Perception WDL  Hallucination None reported or observed  Judgment WDL  Confusion None  Danger to Self  Current suicidal ideation? Denies  Danger to Others  Danger to Others None reported or observed

## 2023-12-30 NOTE — Progress Notes (Signed)
  [facility] Adult Case Management Discharge Plan :  Will you be returning to the same living situation after discharge:  Yes,  pt reports that he is returning to his moms home At discharge, do you have transportation home?: Yes,  pts mother will provide transportation Do you have the ability to pay for your medications: No.  Release of information consent forms completed and in the chart;  Patient's signature needed at discharge.  Patient to Follow up at:  Follow-up Information     Ostwalt, Janna, PA-C. Go on 02/23/2024.   Specialty: Physician Assistant Why: You are scheduled to see Blane Bunting, PA-C at [facility] on Thursday, July 31st at 3:20pm. Please bring your ID, Insurance card and any copy with you.  Please give a 24 hour notice if you need to reschedule or cancel your appointment. Thank you.  You have been added to the wait list in the event that a sooner appointment becomes available. Contact information: [address] [place] [address] [phone]         [facility], [facility] Follow up.   Why: Appointment is scheduled for 01/03/2024 at 8AM for an assessment. Contact information: [address] [place] [address] [phone]                 Next level of care provider has access to [facility] Link:no  Safety Planning and Suicide Prevention discussed: Yes,  SPE completed with the patient and family     Has patient been referred to the Quitline?: Patient refused referral for treatment  Patient has been referred for addiction treatment: Patient refused referral for treatment.  Larri Ply, LCSW 12/30/2023, 11:28 AM

## 2023-12-30 NOTE — Progress Notes (Signed)
 Patient ID: Dustin Bennett, male   DOB: 04-30-1986, 38 y.o.   MRN: [id number]  Discharge Note:  Patient denies SI/HI/AVH at this time. Discharge instructions, AVS, prescriptions, and transition record gone over with patient. Patient agrees to comply with medication management, follow-up visit, and outpatient therapy. Patient belongings returned to patient. Patient questions and concerns addressed and answered. Patient ambulatory off unit. Patient discharged to home with parent, mother.  Patient given copy of his suicide safety plan.

## 2023-12-30 NOTE — Discharge Summary (Signed)
 Physician Discharge Summary Note  Patient:  Dustin Bennett is an 38 y.o., male MRN:  [id number] DOB:  09-09-1985 Patient phone:  [phone] (home)  Patient address:   [address] [place] [address],    Date of Admission:  12/26/2023 Date of Discharge: 12/30/2023  Reason for Admission: Patient is a 38 year old male with a history of bipolar disorder, ADHD, depression, and HIV.  He initially presented for depression and suicidal ideation x 2 weeks following medication noncompliance for 4 weeks.  On H&P he denied SI HI and AVH and noted that he wanted to get back on his medications.  Principal Problem: Bipolar disorder [facility]) Discharge Diagnoses: Principal Problem:   Bipolar disorder [facility])   Past Psychiatric History:  Psychiatric History:  Information collected from patient and chart review   Prev Dx/Sx: see above  Current Psych Provider: none Home Meds (current): none Previous Med Trials: see above  Therapy: see above   Prior Psych Hospitalization: notes 4 different hosp in [place] most recently at [facility] in [place] at the end of April.   Prior Self Harm: none reported Prior Violence: none   Family Psych History: none Family Hx suicide: none   Social History:  Developmental Hx: ADHD previously was on adderall and ritalin Educational Hx: 10 th grade got ged Occupational Hx: Hotel manager work previously  Armed forces operational officer Hx: None Living Situation: with Mother Spiritual Hx: Pension scheme manager to weapons/lethal means: no    Substance History Alcohol: 2-3 x weel  Type of alcohol 3 12oz beers Last Drink Day prior to arrival  Number of drinks per day not daily drinker History of alcohol withdrawal seizures none History of DT's none Tobacco: vape and smoke declines patch Illicit drugs: Marijuana 3-4x week and cocaine 3-4 x week some meth use once a week. States has been hanging out with dealer.  Prescription drug abuse: none Rehab hx: 4x not interested at  present   Social History:  Social History   Substance and Sexual Activity  Alcohol Use Yes     Social History   Substance and Sexual Activity  Drug Use Yes   Types: Marijuana, Cocaine, Methamphetamines    Social History   Socioeconomic History   Marital status: Single    Spouse name: Not on file   Number of children: Not on file   Years of education: Not on file   Highest education level: Not on file  Occupational History   Not on file  Tobacco Use   Smoking status: Every Day    Current packs/day: 1.00    Types: Cigarettes   Smokeless tobacco: Never  Vaping Use   Vaping status: Never Used  Substance and Sexual Activity   Alcohol use: Yes   Drug use: Yes    Types: Marijuana, Cocaine, Methamphetamines   Sexual activity: Not on file  Other Topics Concern   Not on file  Social History Narrative   Not on file   Social Drivers of Health   Financial Resource Strain: Not on file  Food Insecurity: No Food Insecurity (12/26/2023)   Hunger Vital Sign    Worried About Running Out of Food in the Last Year: Never true    Ran Out of Food in the Last Year: Never true  Transportation Needs: No Transportation Needs (12/26/2023)   PRAPARE - Administrator, Civil Service (Medical): No    Lack of Transportation (Non-Medical): No  Physical Activity: Not on file  Stress: Not on file  Social Connections: Not on file   Past Medical History:  Past Medical History:  Diagnosis Date   Bipolar 1 disorder (HCC)    Depression    HIV (human immunodeficiency virus infection) (HCC)     Past Surgical History:  Procedure Laterality Date   LUNG BIOPSY     Family History: History reviewed. No pertinent family history.  Hospital Course:  During the course of hospitalization, pt received daily multiple modalities of treatments consisting of Psychopharmacology, individual, group, psychoeducational, recreational, milieu therapy, including case management to coordinate pts inpatient  and outpatient care and in concert with weekly treatment team meetings. Discharge planning was initiated on the day of admission to ensure a safe discharge. The presenting symptoms were closely monitored and medications were started as indicated. There were no complications. The principal reasons for hospitalization consisted of worsening depression and SI in the context of medication noncompliance and polysubstance use(cocaine, meth, and marijuana).  He was started on Zyprexa  5 mg twice daily and Prozac 20 mg daily given his reported history of bipolar disorder and previous medication trials being which she felt he responded well to.  He denied SI on H&P noted that he wanted to get back on his medications.  He consistently denied SI throughout the admission.  He had a goal being discharged today so that he could make it to his appointment at [employer] to get a [employer] guard and be able to gain gainful employment. Medications addressing the principal problem were initiated with improvement in severity sufficient to discharge to a lower level of care.  During admission patient was also tested for gonorrhea and chlamydia at their request.  Results were negative.  They are instructed to follow-up with PCP for any further concerns and for management of HIV, they were agreeable.  It is intended for the outpatient provider to determine whether to continue these medications, or if these medication needs to be titrated for continued outpatient therapy.  He demonstrates good insight to the need for outpatient follow-up medication compliance however he demonstrates poor insight into his substance use and was not seeking any type of substance use treatment at this time.  All identified psychiatric, general medical/surgical psychosocial obstacles to discharge were addressed. Patient tolerated these medications with no noted side effects. All these medications were titrated to discharge levels (Please see  discharge medications below). Patient showed sustained symptomatic improvement before discharge. The patient denied suicidal, homicidal ideations and hallucinations. Collateral contacted to determine baseline behaviors and for safe discharge plan.   On the day of discharge, following sustained improvement in the affect of this patient, continued report of euthymic mood, repeated denial of suicidal, homicidal and other violent ideations, adequate interaction with peers, active participation in groups while on the unit, and denial of adverse reactions from the medications, the treatment team decided that Pt was stable for discharge with scheduled mental health treatment as below. A comprehensive risk assessment was done prior to discharge and shows that patient is at low risk for suicide or violence. At the time of discharge, patient no longer meeting criteria for IVC, patient is not an imminent danger to self or others. patient agrees to call Crisis Services, 911 and/or return to the ED if safety cannot be maintained outside the hospital setting. Discharge medications reviewed with patient, explanation of indication, risks/benefits and side effects profiles. The patient verbalized understanding and is in agreement with the discharge plan.    Physical Findings: AIMS: Facial and Oral Movements Muscles  of Facial Expression: None Lips and Perioral Area: None Jaw: None Tongue: None,Extremity Movements Upper (arms, wrists, hands, fingers): None Lower (legs, knees, ankles, toes): None, Trunk Movements Neck, shoulders, hips: None, Global Judgements Severity of abnormal movements overall : None Incapacitation due to abnormal movements: None Patient's awareness of abnormal movements: No Awareness, Dental Status Current problems with teeth and/or dentures?: No Does patient usually wear dentures?: No Ed[place]ia?: No  CIWA:    COWS:        Psychiatric Specialty Exam:  Presentation  General Appearance:   Casual  Eye Contact: Fair  Speech: Clear and Coherent  Speech Volume: Normal    Mood and Affect  Mood: Euthymic  Affect: Congruent   Thought Process  Thought Processes: Coherent  Descriptions of Associations:Intact  Orientation:Full (Time, Place and Person)  Thought Content:Logical  Hallucinations:Hallucinations: None  Ideas of Reference:None  Suicidal Thoughts:Suicidal Thoughts: No  Homicidal Thoughts:Homicidal Thoughts: No   Sensorium  Memory: Immediate Fair; Recent Fair  Judgment: Good  Insight: Fair   Art therapist  Concentration: Good  Attention Span: Good  Recall: Good  Fund of Knowledge: Fair  Language: Good   Psychomotor Activity  Psychomotor Activity: Psychomotor Activity: Normal  Musculoskeletal: Strength & Muscle Tone: within normal limits Gait & Station: normal Assets  Assets: Manufacturing systems engineer; Desire for Improvement   Sleep  Sleep: Sleep: Good    Physical Exam: Physical Exam Vitals and nursing note reviewed.  HENT:     Head: Atraumatic.  Eyes:     Extraocular Movements: Extraocular movements intact.  Pulmonary:     Effort: Pulmonary effort is normal.  Neurological:     Mental Status: He is alert and oriented to person, place, and time.  Psychiatric:        Mood and Affect: Mood normal.        Behavior: Behavior normal.        Thought Content: Thought content normal.    Review of Systems  Psychiatric/Behavioral:  Negative for hallucinations, substance abuse and suicidal ideas. The patient is not nervous/anxious and does not have insomnia.    Blood pressure 115/78, pulse (!) 53, temperature (!) 97.2 F (36.2 C), resp. rate 16, height 5\' 11"  (1.803 m), weight 95 kg, SpO2 98%. Body mass index is 29.21 kg/m.   Social History   Tobacco Use  Smoking Status Every Day   Current packs/day: 1.00   Types: Cigarettes  Smokeless Tobacco Never   Tobacco Cessation:  A prescription for an  FDA-approved tobacco cessation medication was offered at discharge and the patient refused   Blood Alcohol level:  Lab Results  Component Value Date   [facility] <15 12/25/2023   ETH 85 (H) 07/24/2019    Metabolic Disorder Labs:  Lab Results  Component Value Date   HGBA1C 5.5 12/30/2023   MPG 111.15 12/30/2023   Lab Results  Component Value Date   PROLACTIN 11.2 12/08/2015   Lab Results  Component Value Date   CHOL 163 12/30/2023   TRIG 68 12/30/2023   HDL 53 12/30/2023   CHOLHDL 3.1 12/30/2023   VLDL 14 12/30/2023   LDLCALC 96 12/30/2023   LDLCALC 68 12/08/2015    See Psychiatric Specialty Exam and Suicide Risk Assessment completed by Attending Physician prior to discharge.  Discharge destination:  Home  Is patient on multiple antipsychotic therapies at discharge:  No   Has Patient had three or more failed trials of antipsychotic monotherapy by history:  No  Recommended Plan for Multiple Antipsychotic Therapies: NA  Discharge Instructions     Diet - low sodium heart healthy   Complete by: As directed    Increase activity slowly   Complete by: As directed       Allergies as of 12/30/2023       Reactions   Seroquel  [quetiapine ] Hives   Wellbutrin [bupropion] Hives   Zyprexa  [olanzapine ] Hives        Medication List     STOP taking these medications    Cabenuva 600 & 900 MG/3ML injection Generic drug: cabotegravir & rilpivirine ER       TAKE these medications      Indication  atorvastatin 20 MG tablet Commonly known as: LIPITOR Take 20 mg by mouth daily.  Indication: High Amount of Fats in the Blood   FLUoxetine 20 MG capsule Commonly known as: PROZAC Take 1 capsule (20 mg total) by mouth daily.  Indication: Depressive Phase of Manic-Depression   OLANZapine  zydis 5 MG disintegrating tablet Commonly known as: ZYPREXA  Take 1 tablet (5 mg total) by mouth 2 (two) times daily. What changed:  medication strength how much to take when to take  this  Indication: Depressive Phase of Manic-Depression        Follow-up Information     Ostwalt, Janna, PA-C. Go on 02/23/2024.   Specialty: Physician Assistant Why: You are scheduled to see Blane Bunting, PA-C at [facility] on Thursday, July 31st at 3:20pm. Please bring your ID, Insurance card and any copy with you.  Please give a 24 hour notice if you need to reschedule or cancel your appointment. Thank you.  You have been added to the wait list in the event that a sooner appointment becomes available. Contact information: [address] [place] [address] [phone]         [facility] Follow up.   Why: Appointment is scheduled for 01/03/2024 at 8AM for an assessment. Contact information: [address] [place] [address] [phone]                 Follow-up recommendations:    # It is recommended to the patient to continue psychiatric medications as prescribed, after discharge from the hospital.   # It is recommended to the patient to follow up with your outpatient psychiatric provider and PCP. # It was discussed with the patient, the impact of alcohol, drugs, tobacco have been there overall psychiatric and medical wellbeing, and total abstinence from substance use was recommended. # Prescriptions provided or sent directly to preferred pharmacy at discharge. Patient agreeable to plan. Given the opportunity to ask questions. Appears to feel comfortable with discharge.  # In the event of worsening symptoms, the patient is instructed to call the crisis hotline (988), 911 and or go to the nearest ED for appropriate evaluation and treatment of symptoms. To follow-up with primary care provider for other medical issues, concerns and or health care needs # Patient was discharged home as requested with a plan to follow up as noted above.      Signed: Fay Hoop, PA-C 12/30/2023, 4:02 PM

## 2024-01-22 ENCOUNTER — Encounter: Payer: Self-pay | Admitting: Adult Health

## 2024-01-22 ENCOUNTER — Other Ambulatory Visit: Payer: Self-pay

## 2024-01-22 ENCOUNTER — Emergency Department
Admission: EM | Admit: 2024-01-22 | Discharge: 2024-01-22 | Disposition: A | Discharge status: Other Acute Inpatient Hospital | Payer: 59 | Attending: Emergency Medicine | Admitting: Emergency Medicine

## 2024-01-22 ENCOUNTER — Inpatient Hospital Stay
Admission: AD | Admit: 2024-01-22 | Discharge: 2024-01-24 | DRG: 897 | Disposition: A | Discharge status: Home / Self Care | Payer: 59 | Source: Intra-hospital | Attending: Psychiatry | Admitting: Psychiatry

## 2024-01-22 DIAGNOSIS — F1994 Other psychoactive substance use, unspecified with psychoactive substance-induced mood disorder: Secondary | ICD-10-CM | POA: Diagnosis present

## 2024-01-22 DIAGNOSIS — F1590 Other stimulant use, unspecified, uncomplicated: Secondary | ICD-10-CM | POA: Insufficient documentation

## 2024-01-22 DIAGNOSIS — F32A Depression, unspecified: Secondary | ICD-10-CM | POA: Insufficient documentation

## 2024-01-22 DIAGNOSIS — F1490 Cocaine use, unspecified, uncomplicated: Secondary | ICD-10-CM | POA: Insufficient documentation

## 2024-01-22 DIAGNOSIS — F319 Bipolar disorder, unspecified: Secondary | ICD-10-CM | POA: Insufficient documentation

## 2024-01-22 DIAGNOSIS — D72819 Decreased white blood cell count, unspecified: Secondary | ICD-10-CM | POA: Insufficient documentation

## 2024-01-22 DIAGNOSIS — Z79899 Other long term (current) drug therapy: Secondary | ICD-10-CM | POA: Insufficient documentation

## 2024-01-22 DIAGNOSIS — F17290 Nicotine dependence, other tobacco product, uncomplicated: Secondary | ICD-10-CM | POA: Diagnosis present

## 2024-01-22 DIAGNOSIS — Z5900 Homelessness unspecified: Secondary | ICD-10-CM

## 2024-01-22 DIAGNOSIS — Z5982 Transportation insecurity: Secondary | ICD-10-CM

## 2024-01-22 DIAGNOSIS — F329 Major depressive disorder, single episode, unspecified: Secondary | ICD-10-CM | POA: Insufficient documentation

## 2024-01-22 DIAGNOSIS — R45851 Suicidal ideations: Secondary | ICD-10-CM | POA: Diagnosis present

## 2024-01-22 DIAGNOSIS — Z888 Allergy status to other drugs, medicaments and biological substances status: Secondary | ICD-10-CM | POA: Diagnosis not present

## 2024-01-22 DIAGNOSIS — F1914 Other psychoactive substance abuse with psychoactive substance-induced mood disorder: Secondary | ICD-10-CM | POA: Insufficient documentation

## 2024-01-22 DIAGNOSIS — F17210 Nicotine dependence, cigarettes, uncomplicated: Secondary | ICD-10-CM | POA: Diagnosis present

## 2024-01-22 DIAGNOSIS — Z818 Family history of other mental and behavioral disorders: Secondary | ICD-10-CM

## 2024-01-22 DIAGNOSIS — Z21 Asymptomatic human immunodeficiency virus [HIV] infection status: Secondary | ICD-10-CM | POA: Diagnosis present

## 2024-01-22 HISTORY — DX: Asymptomatic human immunodeficiency virus (hiv) infection status: Z21

## 2024-01-22 LAB — COMPREHENSIVE METABOLIC PANEL WITH GFR
ALT: 25 U/L (ref 0–44)
AST: 25 U/L (ref 15–41)
Albumin: 4 g/dL (ref 3.5–5.0)
Alkaline Phosphatase: 43 U/L (ref 38–126)
Anion gap: 9 (ref 5–15)
BUN: 6 mg/dL (ref 6–20)
CO2: 22 mmol/L (ref 22–32)
Calcium: 9.4 mg/dL (ref 8.9–10.3)
Chloride: 105 mmol/L (ref 98–111)
Creatinine, Ser: 0.98 mg/dL (ref 0.61–1.24)
GFR, Estimated: >60 mL/min (ref >60)
Glucose, Bld: 99 mg/dL (ref 70–99)
Potassium: 3.2 mmol/L — ABNORMAL LOW (ref 3.5–5.1)
Sodium: 136 mmol/L (ref 135–145)
Total Bilirubin: 0.6 mg/dL (ref 0.0–1.2)
Total Protein: 7 g/dL (ref 6.5–8.1)

## 2024-01-22 LAB — CBC WITH DIFFERENTIAL/PLATELET
Abs Immature Granulocytes: 0.01 10*3/uL (ref 0.00–0.07)
Basophils Absolute: 0 10*3/uL (ref 0.0–0.1)
Basophils Relative: 1 %
Eosinophils Absolute: 0 10*3/uL (ref 0.0–0.5)
Eosinophils Relative: 1 %
HCT: 42 % (ref 39.0–52.0)
Hemoglobin: 14.5 g/dL (ref 13.0–17.0)
Immature Granulocytes: 0 %
Lymphocytes Relative: 43 %
Lymphs Abs: 1.4 10*3/uL (ref 0.7–4.0)
MCH: 31 pg (ref 26.0–34.0)
MCHC: 34.5 g/dL (ref 30.0–36.0)
MCV: 89.7 fL (ref 80.0–100.0)
Monocytes Absolute: 0.3 10*3/uL (ref 0.1–1.0)
Monocytes Relative: 10 %
Neutro Abs: 1.5 10*3/uL — ABNORMAL LOW (ref 1.7–7.7)
Neutrophils Relative %: 45 %
Platelets: 187 10*3/uL (ref 150–400)
RBC: 4.68 MIL/uL (ref 4.22–5.81)
RDW: 13.9 % (ref 11.5–15.5)
WBC: 3.2 10*3/uL — ABNORMAL LOW (ref 4.0–10.5)
nRBC: 0 % (ref 0.0–0.2)

## 2024-01-22 LAB — URINE DRUG SCREEN, QUALITATIVE (ARMC ONLY)
Amphetamines, Ur Screen: POSITIVE — AB
Barbiturates, Ur Screen: NOT DETECTED
Benzodiazepine, Ur Scrn: NOT DETECTED
Cannabinoid 50 Ng, Ur ~~LOC~~: NOT DETECTED
Cocaine Metabolite,Ur ~~LOC~~: POSITIVE — AB
MDMA (Ecstasy)Ur Screen: NOT DETECTED
Methadone Scn, Ur: NOT DETECTED
Opiate, Ur Screen: NOT DETECTED
Phencyclidine (PCP) Ur S: NOT DETECTED
Tricyclic, Ur Screen: NOT DETECTED

## 2024-01-22 LAB — ACETAMINOPHEN LEVEL: Acetaminophen (Tylenol), Serum: <10 ug/mL — ABNORMAL LOW (ref 10–30)

## 2024-01-22 LAB — SALICYLATE LEVEL: Salicylate Lvl: <7 mg/dL — ABNORMAL LOW (ref 7.0–30.0)

## 2024-01-22 LAB — ETHANOL: Alcohol, Ethyl (B): 27 mg/dL — ABNORMAL HIGH (ref <15)

## 2024-01-22 MED ORDER — SERTRALINE HCL 25 MG PO TABS
50.0000 mg | ORAL_TABLET | Freq: Every day | ORAL | Status: DC
  Administered 2024-01-23 – 2024-01-24 (×2): 50 mg via ORAL
  Filled 2024-01-22 (×2): qty 2

## 2024-01-22 MED ORDER — HALOPERIDOL 5 MG PO TABS: 5.0000 mg | ORAL_TABLET | Freq: Three times a day (TID) | ORAL | Status: DC | PRN

## 2024-01-22 MED ORDER — DIPHENHYDRAMINE HCL 50 MG/ML IJ SOLN: 50.0000 mg | Freq: Three times a day (TID) | INTRAMUSCULAR | Status: DC | PRN

## 2024-01-22 MED ORDER — MAGNESIUM HYDROXIDE 400 MG/5ML PO SUSP: 30.0000 mL | Freq: Every day | ORAL | Status: DC | PRN

## 2024-01-22 MED ORDER — HALOPERIDOL LACTATE 5 MG/ML IJ SOLN: 5.0000 mg | Freq: Three times a day (TID) | INTRAMUSCULAR | Status: DC | PRN

## 2024-01-22 MED ORDER — LORAZEPAM 2 MG/ML IJ SOLN: 2.0000 mg | Freq: Three times a day (TID) | INTRAMUSCULAR | Status: DC | PRN

## 2024-01-22 MED ORDER — DIPHENHYDRAMINE HCL 25 MG PO CAPS: 50.0000 mg | ORAL_CAPSULE | Freq: Three times a day (TID) | ORAL | Status: DC | PRN

## 2024-01-22 MED ORDER — LORAZEPAM 2 MG/ML IJ SOLN
2.0000 mg | Freq: Once | INTRAMUSCULAR | Status: DC
  Filled 2024-01-22: qty 1

## 2024-01-22 MED ORDER — ALUM & MAG HYDROXIDE-SIMETH 200-200-20 MG/5ML PO SUSP: 30.0000 mL | ORAL | Status: DC | PRN

## 2024-01-22 MED ORDER — ONDANSETRON 4 MG PO TBDP
4.0000 mg | ORAL_TABLET | Freq: Once | ORAL | Status: AC
  Administered 2024-01-22: 4 mg via ORAL
  Filled 2024-01-22: qty 1

## 2024-01-22 MED ORDER — ACETAMINOPHEN 325 MG PO TABS: 650.0000 mg | ORAL_TABLET | Freq: Four times a day (QID) | ORAL | Status: DC | PRN

## 2024-01-22 MED ORDER — LORAZEPAM 1 MG PO TABS
1.0000 mg | ORAL_TABLET | Freq: Once | ORAL | Status: AC
  Administered 2024-01-22: 1 mg via ORAL
  Filled 2024-01-22: qty 1

## 2024-01-22 MED ORDER — NICOTINE 14 MG/24HR TD PT24: 14.0000 mg | MEDICATED_PATCH | Freq: Every day | TRANSDERMAL | Status: DC

## 2024-01-22 MED ORDER — HALOPERIDOL LACTATE 5 MG/ML IJ SOLN: 10.0000 mg | Freq: Three times a day (TID) | INTRAMUSCULAR | Status: DC | PRN

## 2024-01-22 MED ORDER — HALOPERIDOL LACTATE 5 MG/ML IJ SOLN
5.0000 mg | Freq: Once | INTRAMUSCULAR | Status: DC
  Filled 2024-01-22: qty 1

## 2024-01-22 MED ORDER — GABAPENTIN 100 MG PO CAPS: 200.0000 mg | ORAL_CAPSULE | Freq: Two times a day (BID) | ORAL | Status: DC

## 2024-01-22 MED ORDER — SERTRALINE HCL 50 MG PO TABS: 50.0000 mg | ORAL_TABLET | Freq: Every day | ORAL | Status: DC

## 2024-01-22 MED ORDER — NICOTINE 14 MG/24HR TD PT24
14.0000 mg | MEDICATED_PATCH | Freq: Every day | TRANSDERMAL | Status: DC
  Filled 2024-01-22: qty 1

## 2024-01-22 MED ORDER — DIPHENHYDRAMINE HCL 50 MG/ML IJ SOLN
50.0000 mg | Freq: Once | INTRAMUSCULAR | Status: DC
  Filled 2024-01-22: qty 1

## 2024-01-22 MED ORDER — LORAZEPAM 1 MG PO TABS: 1.0000 mg | ORAL_TABLET | Freq: Two times a day (BID) | ORAL | Status: DC | PRN

## 2024-01-22 MED ORDER — GABAPENTIN 100 MG PO CAPS
200.0000 mg | ORAL_CAPSULE | Freq: Two times a day (BID) | ORAL | Status: DC
  Administered 2024-01-22 – 2024-01-24 (×4): 200 mg via ORAL
  Filled 2024-01-22 (×4): qty 2

## 2024-01-22 NOTE — Group Note (Signed)
 LCSW Group Therapy Note   Group Date: 01/22/2024 Start Time: 1300 End Time: 1400   Type of Therapy and Topic:  Group Therapy: Boundaries  Participation Level:  Did Not Attend  Description of Group: This group will address the use of boundaries in their personal lives. Patients will explore why boundaries are important, the difference between healthy and unhealthy boundaries, and negative and postive outcomes of different boundaries and will look at how boundaries can be crossed.  Patients will be encouraged to identify current boundaries in their own lives and identify what kind of boundary is being set. Facilitators will guide patients in utilizing problem-solving interventions to address and correct types boundaries being used and to address when no boundary is being used. Understanding and applying boundaries will be explored and addressed for obtaining and maintaining a balanced life. Patients will be encouraged to explore ways to assertively make their boundaries and needs known to significant others in their lives, using other group members and facilitator for role play, support, and feedback.  Therapeutic Goals:  1.  Patient will identify areas in their life where setting clear boundaries could be  used to improve their life.  2.  Patient will identify signs/triggers that a boundary is not being respected. 3.  Patient will identify two ways to set boundaries in order to achieve balance in  their lives: 4.  Patient will demonstrate ability to communicate their needs and set boundaries  through discussion and/or role plays  Summary of Patient Progress:  Patient did not attend.   Therapeutic Modalities:   Cognitive Behavioral Therapy Solution-Focused Therapy  Alveta CHRISTELLA Kerns, LCSWA 01/22/2024  2:12 PM

## 2024-01-22 NOTE — ED Notes (Addendum)
 Hospital meal provided, pt refused.

## 2024-01-22 NOTE — ED Provider Notes (Signed)
-----------------------------------------   11:26 AM on 01/22/2024 ----------------------------------------- Patient has been seen by psychiatry and informed that the patient will be admitted to the psychiatric unit.  Patient has also become extremely agitated.  I ordered IM sedation medications.  However the nurse was able to de-escalate the situation and the patient is now calm.  Did not end up needing IM sedation medications.   Dorothyann Drivers, MD 01/22/24 1515

## 2024-01-22 NOTE — ED Notes (Signed)
 Pt given a cup of water at this time.

## 2024-01-22 NOTE — ED Provider Notes (Addendum)
 Patient now states that he would like to go home.  He states that he has to attend to urgent family matters that have just come up  (which is curious because I do not think he has had the ability to be in contact with family since being here) and has an upcoming appointment that he does not want to miss.  I spoke with Jasmine Faulcon from behavioral health who interviewed the patient just recently and from my verbal conversation with her and from her note it sounds that the patient does have a plan of overdosing on medication.  She notes that the patient had just over the last couple of weeks he has worsening depression and this plan for overdosing.  She notes in her interview that the patient is very guarded and exhibits some paranoia.  Given this collateral information, for his suicidal ideation acutely and a plan for overdosing on pills, I do not think discharge at this time would be safe, and I have placed the patient under involuntary commitment.    Cyrena Mylar, MD 01/22/24 [phone]

## 2024-01-22 NOTE — ED Notes (Signed)
 Pt dressed out with Manuelita, ED tech  1 Blue backpack 1 pair of black socks 1 blue sweater 1 pair of grey pants 1 pair of grey boxers 1 pair of grey shoes
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
## 2024-01-22 NOTE — Plan of Care (Signed)
  Problem: Education: Goal: Knowledge of [facility] Education information/materials will improve Outcome: Progressing Goal: Mental status will improve Outcome: Progressing   Problem: Activity: Goal: Interest or engagement in activities will improve Outcome: Progressing   Problem: Coping: Goal: Ability to verbalize frustrations and anger appropriately will improve Outcome: Progressing   Problem: Safety: Goal: Periods of time without injury will increase Outcome: Progressing

## 2024-01-22 NOTE — ED Notes (Signed)
 Report given to [facility].

## 2024-01-22 NOTE — Consult Note (Signed)
 [facility] Psychiatric Consult Initial  Patient Name: .Dustin Bennett  MRN: [id number]  DOB: 24-Dec-1985  Consult Order details:  Orders (From admission, onward)     Start     Ordered   01/22/24 0226  CONSULT TO CALL ACT TEAM       Ordering Provider: Cyrena Mylar, MD  Provider:  (Not yet assigned)  Question:  Reason for Consult?  Answer:  Psych consult   01/22/24 0225   01/22/24 0225  IP CONSULT TO PSYCHIATRY       Ordering Provider: Cyrena Mylar, MD  Provider:  (Not yet assigned)  Question Answer Comment  Place call to: ED   Reason for Consult Admit      01/22/24 0225             Mode of Visit: Tele-visit Virtual Statement:TELE PSYCHIATRY ATTESTATION & CONSENT As the provider for this telehealth consult, I attest that I verified the patient's identity using two separate identifiers, introduced myself to the patient, provided my credentials, disclosed my location, and performed this encounter via a HIPAA-compliant, real-time, face-to-face, two-way, interactive audio and video platform and with the full consent and agreement of the patient (or guardian as applicable.) Patient physical location: [facility]. Telehealth provider physical location: home office in state of [place].   Video start time: 0908 Video end time: 0933    Psychiatry Consult Evaluation  Service Date: January 22, 2024 LOS:  LOS: 0 days  Chief Complaint I had an argument with my mom, I mean I had a disagreement.   Primary Psychiatric Diagnoses  Substance Induced Mood Disorder 2.  Bipolar Disorder 3.  Depression  Assessment  Dustin Bennett is a 38 y.o. male admitted: Presented to the EDfor 01/22/2024  2:09 AM for    Per ED Provider Admission Assessment 01/22/2024@0211 :  No chief complaint on file.     HPI   Dustin Bennett is a 38 y.o. male   Past medical history of no known past medical history but self-reported depression, coming home to [place]  from [facility], here for depression and  suicidal ideation.  He called police for help voluntarily to come to the hospital to seek psychiatric help.   He states he has been suicidal and has had thoughts about overdosing but has not harmed himself or overdosed.   Denies HI or AVH.  Denies drug use.  He does occasionally use alcohol but not daily.   He had a mixed drink earlier today given to him by a friend.  There was no self-harm intent.   Independent Historian contributed to assessment above: EMS gives a report of picking up the patient from police after the patient noted suicidal ideation and depression  Psychiatric Assessment 01/22/2024: He carries the psychiatric diagnoses of ADHD, depression, bipolar disorder and polysubstance abuse. He denies prior medical diagnosis.   Patient called 911 for hospital transport for evaluation os suicidal ideations and plan to overdose on medications.  When he arrived at the emergency department patient changed his mind and wanted to go home.  The emergency room provider believed patient was a safety risk and initiated IVC paperwork. On assessment today, patient is alert and oriented to self and events that led to current hospitalization. Otherwise, is disoriented to place and time and has to look at her armband for assistance. He is guarded and minimizes suicidal ideations.  His thoughts are linear, devoid of psychosis. He admits to alcohol usage but denies illicit dug usage despite being informed  his UDS was positive for amphetamines and cocaine. He offers incongruent information regarding medication compliance.  We discussed my concerns of under treated depression,  and disinhibition from polysubstance usage contributes to lack of impulse control and safety concerns.  Based on above, patient is unable to make good decisions and lacks sufficient insight of his concerns. Due to acute safety concerns and potential that patient will follow through on suicide attempt, I recommend upholding his IVC and  referral for inpatient psych treatment.  Above was discussed with patient, as well as plans to restart home meds; patient states he's not interested in inpatient treatment. He is apprised of IVC status.   His current presentation of suicidal ideations with plan to overdose on medications, UDS + for cocaine and amphetamines is most consistent with substance induced mood disorder. He meets criteria for psychiatric admission based on above.  Current outpatient psychotropic medications include olanzapine 5mg  po at bedtime and sertraline 50mg  po daily and historically he has had a good response to these medications. He was non compliant with medications prior to admission as evidenced by patient inconsistent reports.   Please see plan below for detailed recommendations.   Diagnoses:  Active Hospital problems: Principal Problem:   Substance induced mood disorder (HCC) Active Problems:   Bipolar disorder (HCC)   Depression   Amphetamine use   Cocaine use    Plan   ## Psychiatric Medication Recommendations:  Restart home medication Sertraline 50mg  po daily for depression Olanzapine 5mg  po at bedtime for bipolar dx Gabapentin 200mg  po BID for alcohol withdrawal/anxiety  Agitation protocol prn  ## Medical Decision Making Capacity: Not specifically addressed in this encounter  ## Further Work-up:  -- UDS EKG, While pt on Qtc prolonging medications, please monitor & replete K+ to 4 and Mg2+ to 2, or TOC consult for substance abuse resources -- Pertinent labwork reviewed earlier this admission includes: CMP, CBC, UDS + for amphetamines and cocaine, BAL was 27.    ## Disposition:-- We recommend inpatient psychiatric hospitalization when medically cleared. Patient is under voluntary admission status at this time; please IVC if attempts to leave hospital.  ## Behavioral / Environmental: -Difficult Patient (SELECT OPTIONS FROM BELOW) or Utilize compassion and acknowledge the patient's experiences  while setting clear and realistic expectations for care.    ## Safety and Observation Level:  - Based on my clinical evaluation, I estimate the patient to be at low risk of self harm in the current setting. - At this time, we recommend  routine. This decision is based on my review of the chart including patient's history and current presentation, interview of the patient, mental status examination, and consideration of suicide risk including evaluating suicidal ideation, plan, intent, suicidal or self-harm behaviors, risk factors, and protective factors. This judgment is based on our ability to directly address suicide risk, implement suicide prevention strategies, and develop a safety plan while the patient is in the clinical setting. Please contact our team if there is a concern that risk level has changed.  CSSR Risk Category:C-SSRS RISK CATEGORY: High Risk  Suicide Risk Assessment: Patient has following modifiable risk factors for suicide: untreated depression, recklessness, medication noncompliance, triggering events, recent psychiatric hospitalization, and recent loss (death, isolation, vocation), which we are addressing by referral for psychiatric admission to restart psychotropic medications and safety monitoring. Patient has following non-modifiable or demographic risk factors for suicide: male gender and psychiatric hospitalization Patient has the following protective factors against suicide: Supportive family  Thank you for this  consult request. Recommendations have been communicated to the primary team.  We will continue to follow until patient is transferred to psychiatric facility.   Bernadette FORBES Barefoot, NP       History of Present Illness  Relevant Aspects of Hospital ED Course:  Admitted on 01/22/2024 for suicidal ideations and a plan to overdose on medication.   Per RN Triage note dated 01/22/2024@0232 : Pt arrived via EMS from home. Pt called 911 for thoughts of killing himself. Pt  endorses suicidal ideation with a plan of overdosing on medications. Pt has been off of his psychosis medications for a few months. Pt stated he did drink some alcohol today that was mixed with something else. Pt said some guy he had just recently met made him an alcoholic drink mixed other unknown substance.   Patient Report:  Patient observed sitting in interview room; he is adequately groomed, dressed in hospital scrubs, no ADL deficits.  He appears nervous and observed fidgeting in his chair.  Patient greeted by this Clinical research associate and given anticipatory guidance.  Patient is alert and oriented to self, observed looking at his armband before stating his location and today's date.  He reports his primary reason for the hospital visit as, It's not that big a issue. He reports prior to admission, I had a crisis with my mother and I felt like I wanted to kill himself.  He then restates his comments, I mean I had an argument , no really a disagreement with my mother. He is guarded about the exchange.  He initially does not provide additional details until after being encouraged by this Clinical research associate. Patient reports his brother is being locked up tomorrow and his mother was planning a dinner for him tonight.  He states this led to some conflict between him and his mother, again he does not explain why.  He concludes by stating, he's under a lot of stress, recently loss his cousin and he's currently not working.  States he realizes he needs, to step up and be a man.  Patient reports a hx for ADHD, Bipolar and Depression.  He denies manic episodes and states the bipolar dx did not follow substance abuse.  He reports he's been hospitalized 2x over his lifetime for mental illness, most recent was 2 weeks ago when he presented to [facility] for depression and suicidal ideations.  He reports he was started on zoloft (unsure of dose) for depression and felt his symptoms improved.  He also reports taking olanzapine 5mg  po daily  for bipolar disorder. Pt reports medication compliance, last took meds 2 days ago.  However, he did not follow up with [facility] as recommended; and cannot explain what caused barriers to follow up.  Currently, he's not followed by outpatient psychiatry.    He reports getting 6 hours of sleep daily; eat 3-4 meals daily.  Patient denies illicit drug usage, denies marijuana usage. However, his reports are inconsistent with his UDS completed on admission which is + for amphetamine and cocaine.   Psych ROS:  Depression: denies but that is why he is here.  Anxiety:  yes Mania (lifetime and current): denies Psychosis: (lifetime and current): denies  Collateral information:  Contacted patient's mother,Deborah Gulliam at [phone]; Patient gave permission for this writer to speak with his mother for collateral but provided an incorrect #.  Per nursing, following psychiatric assessment, patient reached out to his mother first and then provided the correct # to nursing staff.    Per collateral, His mother  states she worked most of the day, when she arrived home at 9pm last night, she brought patient a beer. She reports a little later patient told her he did not feel right.  She asked him if he took his medication but she was unsure of his response.  She offers several digression and is somewhat guarded.  However, she does report patient asked her to call the the ambulance, she does not elaborate and does not provide details of an argument with her son. She provides collateral that patient recently lost a 1st cousin to violence about one month ago.  She states patient's brother is having a dinner tonight before going to prison for the next 8 months.    She reports patient relocated from [facility] and currently stays with her. She believes he was using methamphetamines and other drugs when he lives in [place].  She reports patient is not working but states he's trying to get a job to get his own place. She states he  needs to follow up with [facility] but he does not want to. She reports  [facility] has called him but he has not returned their call.       Review of Systems  Constitutional: Negative.   HENT: Negative.    Eyes: Negative.   Respiratory: Negative.    Cardiovascular: Negative.   Gastrointestinal: Negative.   Genitourinary: Negative.   Musculoskeletal: Negative.   Skin: Negative.   Neurological: Negative.   Endo/Heme/Allergies: Negative.   Psychiatric/Behavioral:  Positive for depression, substance abuse and suicidal ideas. Negative for hallucinations. The patient is nervous/anxious.      Psychiatric and Social History  Psychiatric History:  Information collected from patient, chart review and his mother, Barnie South   Prev Dx/Sx: Depression, Bipolar Dx Current Psych Provider: none Home Meds (current):  Olanzapine 5mg  po BID; x3 years Zoloft   unsure of dose  started 2 weeks ago for depression.   Previous Med Trials: pt denies Therapy: none  Prior Psych Hospitalization: yes, 1. Two weeks ago [facility] for Depression and suicidal ideation; 2. Hospitalized 6 months ago in [facility] for SI, I needed to see my doctor to get my medication tweaked  Prior Self Harm: he denies Prior Violence: he denies  Family Psych History: denies Family Hx suicide: denies  Social History:  Developmental Hx: denies Educational Hx: GED  Occupational Hx: Unemployed Legal Hx: prior arrest long time ago for larceny charge. Charges were dismissed Living Situation: lives with his mother; Spiritual Hx: I believe in God, yes Access to weapons/lethal means: denies   Substance History Alcohol: yes  Type of alcohol mixed drink Last Drink:prior to admission Number of drinks per day pt denies daily consumption History of alcohol withdrawal seizures pt denies History of DT's denies Tobacco: smokes cigarettes 1/2 pak daily Illicit drugs: denies but UDS is + or amphetamine and cocaine Prescription drug  abuse: denies Rehab hx: denies  Exam Findings  Physical Exam:  Vital Signs:  Temp:  [98.6 F (37 C)-99 F (37.2 C)] 99 F (37.2 C) (06/29 0806) Pulse Rate:  [72-100] 72 (06/29 0806) Resp:  [16] 16 (06/29 0806) BP: (115-154)/(74-102) 115/74 (06/29 0806) SpO2:  [98 %-100 %] 100 % (06/29 0806) Blood pressure 115/74, pulse 72, temperature 99 F (37.2 C), temperature source Oral, resp. rate 16, SpO2 100%. There is no height or weight on file to calculate BMI.  Physical Exam  Cardiovascular:     Rate and Rhythm: Normal rate.  Pulses: Normal pulses.  Pulmonary:     Effort: Pulmonary effort is normal.   Musculoskeletal:        General: Normal range of motion.     Cervical back: Normal range of motion.   Neurological:     Mental Status: He is alert.     Mental Status Exam: General Appearance: Casual and Guarded  Orientation:  Other:  oriented to self and event's that led to current admission; has to look at his armband to determine date and hospital name  Memory:  Immediate;   Fair Recent;   Fair Remote;   Fair  Concentration:  Concentration: Poor and Attention Span: Poor  Recall:  Fair  Attention  Fair  Eye Contact:  Fair  Speech:  Clear and Coherent  Language:  Good  Volume:  Normal  Mood: I'm good   Affect:  Blunt, Non-Congruent, and Depressed  Thought Process:  Linear  Thought Content:  Illogical  Suicidal Thoughts:  had SI with plan that led to admission but currentl y minimizes and denies  Homicidal Thoughts:  No  Judgement:  Poor  Insight:  Lacking  Psychomotor Activity:  Increased  Akathisia:  No  Fund of Knowledge:  Poor      Assets:  Communication Skills Housing Social Support  Cognition:  WNL  ADL's:  Intact  AIMS (if indicated):        Other History   These have been pulled in through the EMR, reviewed, and updated if appropriate.  Family History:  The patient's family history is not on file.  Medical History: No past medical history  on file.  Surgical History: None listed on file   Medications:  No current facility-administered medications for this encounter. No current outpatient medications on file.  Facility-Administered Medications Ordered in Other Encounters:    acetaminophen (TYLENOL) tablet 650 mg, 650 mg, Oral, Q6H PRN, Whittney Steenson E, NP   alum & mag hydroxide-simeth (MAALOX/MYLANTA) 200-200-20 MG/5ML suspension 30 mL, 30 mL, Oral, Q4H PRN, Brighid Koch E, NP   haloperidol (HALDOL) tablet 5 mg, 5 mg, Oral, TID PRN **AND** diphenhydrAMINE (BENADRYL) capsule 50 mg, 50 mg, Oral, TID PRN, Abdulrahim Siddiqi E, NP   haloperidol lactate (HALDOL) injection 5 mg, 5 mg, Intramuscular, TID PRN **AND** diphenhydrAMINE (BENADRYL) injection 50 mg, 50 mg, Intramuscular, TID PRN **AND** LORazepam (ATIVAN) injection 2 mg, 2 mg, Intramuscular, TID PRN, Babbie Dondlinger E, NP   haloperidol lactate (HALDOL) injection 10 mg, 10 mg, Intramuscular, TID PRN **AND** diphenhydrAMINE (BENADRYL) injection 50 mg, 50 mg, Intramuscular, TID PRN **AND** LORazepam (ATIVAN) injection 2 mg, 2 mg, Intramuscular, TID PRN, Gauri Galvao E, NP   gabapentin (NEURONTIN) capsule 200 mg, 200 mg, Oral, BID, Quantavious Eggert E, NP   magnesium hydroxide (MILK OF MAGNESIA) suspension 30 mL, 30 mL, Oral, Daily PRN, Ambria Mayfield E, NP   [START ON 01/23/2024] nicotine (NICODERM CQ - dosed in mg/24 hours) patch 14 mg, 14 mg, Transdermal, Daily, Moishe Burton E, NP   [START ON 01/23/2024] sertraline (ZOLOFT) tablet 50 mg, 50 mg, Oral, Daily, Moishe Burton BRAVO, NP  Allergies: Not on File  Burton BRAVO Moishe, NP

## 2024-01-22 NOTE — BH Assessment (Signed)
 Comprehensive Clinical Assessment (CCA) Screening, Triage and Referral Note  01/22/2024 Dustin Bennett [id number] Recommendations for Services/Supports/Treatments: Pending Disposition/Iris Consult. Dustin Bennett is a 38 y.o., Black, Not Hispanic or Latino ethnicity, ENGLISH speaking male who presented to the ED voluntarily for an evaluation. Per triage note: Pt arrived via EMS from home. Pt called 911 for thoughts of killing himself. Pt endorses suicidal ideation with a plan of overdosing on medications. Pt has been off of his psychosis medications for a few months. Pt stated he did drink some alcohol today that was mixed with something else. Pt said some guy he had just recently met made him an alcoholic drink mixed other unknown substance.  Pt was lying awake in bed upon this writer's arrival. Pt was tense, with a rigid posture. When asked what brought him to the hospital the pt. stated, "I was having suicidal thoughts." Pt reported that he'd drank  cup of mixed alcohol within the last 24 hours. Reports that the he consumes alcohol on occasion; denies daily use. Pt reported that he lives with his mother and things have been going ok. Pt identified his main stressor as relationship issues. Pt denied having court/legal issues. The pt. reported having increased anxiety and depression. The pt was reticent; but appeared to be visibly in distress. The pt. had fair insight and judgement. Pt reported that he is not connected to services; however, he has taken medication in the past. Pt presented with an anxious mood; affect was flat. Pt's BAL/UDS pending. Pt continued to endorse SI with a plan to overdose on pills. Pt denied current AV/H/HI.   Chief Complaint: No chief complaint on file.  Visit Diagnosis: MDD recurrent, severe  Patient Reported Information How did you hear about us ? No data recorded What Is the Reason for Your Visit/Call Today? No data recorded How Long Has This Been Causing You  Problems? No data recorded What Do You Feel Would Help You the Most Today? No data recorded  Have You Recently Had Any Thoughts About Hurting Yourself? No data recorded Are You Planning to Commit Suicide/Harm Yourself At This time? No data recorded  Have you Recently Had Thoughts About Hurting Someone Dustin Bennett? No data recorded Are You Planning to Harm Someone at This Time? No data recorded Explanation: No data recorded  Have You Used Any Alcohol or Drugs in the Past 24 Hours? No data recorded How Long Ago Did You Use Drugs or Alcohol? No data recorded What Did You Use and How Much? No data recorded  Do You Currently Have a Therapist/Psychiatrist? No data recorded Name of Therapist/Psychiatrist: No data recorded  Have You Been Recently Discharged From Any Office Practice or Programs? No data recorded Explanation of Discharge From Practice/Program: No data recorded   CCA Screening Triage Referral Assessment Type of Contact: No data recorded Telemedicine Service Delivery:   Is this Initial or Reassessment?   Date Telepsych consult ordered in [facility]:    Time Telepsych consult ordered in [facility]:    Location of Assessment: No data recorded Provider Location: No data recorded   Collateral Involvement: No data recorded  Does Patient Have a Court Appointed Legal Guardian? No data recorded Name and Contact of Legal Guardian: No data recorded If Minor and Not Living with Parent(s), Who has Custody? No data recorded Is CPS involved or ever been involved? No data recorded Is APS involved or ever been involved? No data recorded  Patient Determined To Be At Risk for Harm To Self or Others Based on  Review of Patient Reported Information or Presenting Complaint? No data recorded Method: No data recorded Availability of Means: No data recorded Intent: No data recorded Notification Required: No data recorded Additional Information for Danger to Others Potential: No data recorded Additional Comments  for Danger to Others Potential: No data recorded Are There Guns or Other Weapons in Your Home? No data recorded Types of Guns/Weapons: No data recorded Are These Weapons Safely Secured?                            No data recorded Who Could Verify You Are Able To Have These Secured: No data recorded Do You Have any Outstanding Charges, Pending Court Dates, Parole/Probation? No data recorded Contacted To Inform of Risk of Harm To Self or Others: No data recorded  Does Patient Present under Involuntary Commitment? No data recorded   [place] of Residence: No data recorded  Patient Currently Receiving the Following Services: No data recorded  Determination of Need: No data recorded  Options For Referral: No data recorded  Disposition Recommendation per psychiatric provider: Pending Disposition  Brettney Ficken R Kenitra Leventhal, LCAS

## 2024-01-22 NOTE — ED Provider Notes (Signed)
 [facility] Provider Note    None    (approximate)   History   No chief complaint on file.   HPI  Dustin Bennett is a 38 y.o. male   Past medical history of no known past medical history but self-reported depression, coming home to [place]  from [facility], here for depression and suicidal ideation.  He called police for help voluntarily to come to the hospital to seek psychiatric help.  He states he has been suicidal and has had thoughts about overdosing but has not harmed himself or overdosed.  Denies HI or AVH.  Denies drug use.  He does occasionally use alcohol but not daily.  He had a mixed drink earlier today given to him by a friend.  There was no self-harm intent.  Independent Historian contributed to assessment above: EMS gives a report of picking up the patient from police after the patient noted suicidal ideation and depression       Physical Exam   Triage Vital Signs: ED Triage Vitals  Encounter Vitals Group     BP      Girls Systolic BP Percentile      Girls Diastolic BP Percentile      Boys Systolic BP Percentile      Boys Diastolic BP Percentile      Pulse      Resp      Temp      Temp src      SpO2      Weight      Height      Head Circumference      Peak Flow      Pain Score      Pain Loc      Pain Education      Exclude from Growth Chart     Most recent vital signs: Vitals:   01/22/24 0237  BP: (!) 154/102  Pulse: 100  Temp: 98.6 F (37 C)  SpO2: 98%    General: Awake, no distress.  CV:  Good peripheral perfusion.  Resp:  Normal effort.  Abd:  No distention.  Other:  Somewhat guarded but answering my questions appropriately, avoids eye contact.  Old appearing left shoulder scar.  No other injuries noted on my exam.  Soft nontender abdomen.  Clear lungs to auscultation in all fields.   ED Results / Procedures / Treatments   Labs (all labs ordered are listed, but only abnormal results are  displayed) Labs Reviewed  CBC WITH DIFFERENTIAL/PLATELET  ETHANOL  URINE DRUG SCREEN, QUALITATIVE ([facility] ONLY)  COMPREHENSIVE METABOLIC PANEL WITH GFR  SALICYLATE LEVEL  ACETAMINOPHEN LEVEL     I ordered and reviewed the above labs they are notable for slight leukopenia with a white blood cell count of 3.2 no prior for comparison  EKG  ED ECG REPORT I, Ginnie Shams, the attending physician, personally viewed and interpreted this ECG.   Date: 01/22/2024  EKG Time: 0227  Rate: 94  Rhythm: sinus  Axis: nl  Intervals:nl  ST&T Change: no stemi      PROCEDURES:  Critical Care performed: No  Procedures   MEDICATIONS ORDERED IN ED: Medications  LORazepam (ATIVAN) tablet 1 mg (1 mg Oral Given 01/22/24 0249)  ondansetron (ZOFRAN-ODT) disintegrating tablet 4 mg (4 mg Oral Given 01/22/24 0249)     IMPRESSION / MDM / ASSESSMENT AND PLAN / ED COURSE  I reviewed the triage vital signs and the nursing notes.  Patient's presentation is most consistent with acute presentation with potential threat to life or bodily function.  Differential diagnosis includes, but is not limited to, suicidal ideation, depression, substance-induced mood disorder, intoxication or overdose    MDM:    Suicidal ideation in this patient with a history of depression here voluntarily seeking psychiatric help but denies self-harm attempts today.  Has had thoughts about overdosing but has not acted on these thoughts, willing to undergo evaluation by psychiatry, I see no reason to hold him against his will at this time and furthermore he is here on his own accord seeking help.  Will obtain toxicologic and basic labs, EKG, and if unremarkable plan will be for medical clearance for psychiatric evaluation.  He notes feeling anxious and a little bit nauseated so I will give him a p.o. Ativan and Zofran.       FINAL CLINICAL IMPRESSION(S) / ED DIAGNOSES   Final diagnoses:   Depression with suicidal ideation     Rx / DC Orders   ED Discharge Orders     None        Note:  This document was prepared using Dragon voice recognition software and may include unintentional dictation errors.    Cyrena Mylar, MD 01/22/24 [phone]

## 2024-01-22 NOTE — ED Notes (Signed)
 To Behavioral quad to assist primary nurse with multiple patients who have become upset and/or aggressive.  This pt was in room talking with security upon my arrival visibly upset.  After brief discussion with primary nurse it is my understanding that this pt was upset that he was being admitted under IVC and that he believed he was to be released.  Medications had been ordered for pt.  This RN to pt room to explain IVC process and offer of bed in BMU to begin his inpatient treatment. Also discussed with pt that if medications became necessary, he would not be allowed to admit to BMU and that he would need to wait for a bed offer from an outside facility, thus lengthening his admission.  Pt allowed to vent his frustrations and again information was shared on pt's options at this point.  Pt begins to calm and verbalize understanding of information share and situation as it stands.  Pt eventually agrees to be admitted to [facility] and to remain calm and cooperative with staff and requests.  Pt allowed to call mother before transfer.  Pt escorted to BMU with security without incident.

## 2024-01-22 NOTE — ED Notes (Signed)
 Attempted to call report. RN stated she had not reviewed the chat and would call back.
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
## 2024-01-22 NOTE — Group Note (Signed)
 Date:  01/22/2024 Time:  9:01 PM  Group Topic/Focus:  Self Care:   The focus of this group is to help patients understand the importance of self-care in order to improve or restore emotional, physical, spiritual, interpersonal, and financial health. Stages of Change:   The focus of this group is to explain the stages of change and help patients identify changes they want to make upon discharge. Wrap-Up Group:   The focus of this group is to help patients review their daily goal of treatment and discuss progress on daily workbooks.    Participation Level:  Did Not Attend   Larrie Leita BRAVO 01/22/2024, 9:01 PM

## 2024-01-23 ENCOUNTER — Encounter: Payer: Self-pay | Admitting: Adult Health

## 2024-01-23 DIAGNOSIS — F1994 Other psychoactive substance use, unspecified with psychoactive substance-induced mood disorder: Secondary | ICD-10-CM

## 2024-01-23 NOTE — Group Note (Signed)
 Recreation Therapy Group Note   Group Topic:Coping Skills  Group Date: 01/23/2024 Start Time: 1045 End Time: 1130 Facilitators: Celestia Jeoffrey BRAVO, LRT, CTRS Location: Craft Room   Group Description: Mind Map.  Patient was provided a blank template of a diagram with 32 blank boxes in a tiered system, branching from the center (similar to a bubble chart). LRT directed patients to label the middle of the diagram Coping Skills. LRT and patients then came up with 8 different coping skills as examples. Pt were directed to record their coping skills in the 2nd tier boxes closest to the center.  Patients would then share their coping skills with the group as LRT wrote them out. LRT gave a handout of 99 different coping skills at the end of group.   Goal Area(s) Addressed: Patients will be able to define "coping skills". Patient will identify new coping skills.  Patient will increase communication.   Affect/Mood: N/A   Participation Level: Did not attend    Clinical Observations/Individualized Feedback: Patient did not attend group.   Plan: Continue to engage patient in RT group sessions 2-3x/week.   Jeoffrey BRAVO Celestia, LRT, CTRS 01/23/2024 1:28 PM

## 2024-01-23 NOTE — Progress Notes (Signed)
   01/23/24 1100  Psych Admission Type (Psych Patients Only)  Admission Status Involuntary  Psychosocial Assessment  Patient Complaints None  Eye Contact Fair  Facial Expression Flat  Affect Appropriate to circumstance  Speech Logical/coherent  Interaction Minimal  Motor Activity Other (Comment) (appropriate for developmental age)  Appearance/Hygiene Unremarkable  Behavior Characteristics Cooperative  Mood Pleasant  Thought Process  Coherency WDL  Content WDL  Delusions None reported or observed  Perception WDL  Hallucination None reported or observed  Judgment Poor  Confusion None  Danger to Self  Current suicidal ideation? Denies  Danger to Others  Danger to Others None reported or observed

## 2024-01-23 NOTE — BHH Suicide Risk Assessment (Signed)
 [facility] Admission Suicide Risk Assessment   Nursing information obtained from:  Patient Demographic factors:  Unemployed Current Mental Status:  NA Loss Factors:  Loss of significant relationship Historical Factors:  NA Risk Reduction Factors:  Sense of responsibility to family  Total Time spent with patient: 30 minutes Principal Problem: Substance induced mood disorder (HCC) Diagnosis:  Principal Problem:   Substance induced mood disorder (HCC)  Subjective Data: Dustin Bennett is a 38 y.o. male  Past medical history of no known past medical history but self-reported depression, coming home to [place]  from [facility], here for depression and suicidal ideation.  He called police for help voluntarily to come to the hospital to seek psychiatric help.He states he has been suicidal and has had thoughts about overdosing but has not harmed himself or overdosed. Patient is admitted to adult psych unit with Q15 min safety monitoring. Multidisciplinary team approach is offered. Medication management; group/milieu therapy is offered.   Continued Clinical Symptoms:  Alcohol Use Disorder Identification Test Final Score (AUDIT): 2 The Alcohol Use Disorders Identification Test, Guidelines for Use in Primary Care, Second Edition.  World [employer] writer [facility]). Score between 0-7:  no or low risk or alcohol related problems. Score between 8-15:  moderate risk of alcohol related problems. Score between 16-19:  high risk of alcohol related problems. Score 20 or above:  warrants further diagnostic evaluation for alcohol dependence and treatment.   CLINICAL FACTORS:   Depression:   Insomnia   Musculoskeletal: Strength & Muscle Tone: within normal limits Gait & Station: normal Patient leans: N/A  Psychiatric Specialty Exam:  Presentation  General Appearance:  Appropriate for Environment; Casual  Eye Contact: Fair  Speech: Clear and Coherent  Speech  Volume: Normal  Handedness: Right   Mood and Affect  Mood: Anxious  Affect: Appropriate   Thought Process  Thought Processes: Coherent  Descriptions of Associations:Intact  Orientation:Full (Time, Place and Person)  Thought Content:Logical  History of Schizophrenia/Schizoaffective disorder:No data recorded Duration of Psychotic Symptoms:No data recorded Hallucinations:Hallucinations: None  Ideas of Reference:None  Suicidal Thoughts:Suicidal Thoughts: No  Homicidal Thoughts:Homicidal Thoughts: No   Sensorium  Memory: Immediate Fair; Remote Fair; Recent Fair  Judgment: Fair  Insight: Shallow   Executive Functions  Concentration: Fair  Attention Span: Fair  Recall: [employer] of Knowledge: Fair  Language: Fair   Psychomotor Activity  Psychomotor Activity: Psychomotor Activity: Normal   Assets  Assets: Communication Skills; Desire for Improvement; Resilience; Social Support   Sleep  Sleep: Sleep: Fair    Physical Exam: Physical Exam ROS Blood pressure 102/74, pulse (!) 59, temperature (!) 97.2 F (36.2 C), resp. rate 18, SpO2 100%. There is no height or weight on file to calculate BMI.   COGNITIVE FEATURES THAT CONTRIBUTE TO RISK:  None    SUICIDE RISK:   Minimal: No identifiable suicidal ideation.  Patients presenting with no risk factors but with morbid ruminations; may be classified as minimal risk based on the severity of the depressive symptoms  PLAN OF CARE: Patient is admitted to adult psych unit with Q15 min safety monitoring. Multidisciplinary team approach is offered. Medication management; group/milieu therapy is offered.   I certify that inpatient services furnished can reasonably be expected to improve the patient's condition.   Allyn Foil, MD 01/23/2024, 5:05 PM
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
## 2024-01-23 NOTE — Group Note (Signed)
 Date:  01/23/2024 Time:  11:28 AM  Group Topic/Focus:  Developing a Wellness Toolbox:   The focus of this group is to help patients develop a wellness toolbox with skills and strategies to promote recovery upon discharge.    Participation Level:  Active  Participation Quality:  Appropriate  Affect:  Appropriate  Cognitive:  Appropriate  Insight: Appropriate  Engagement in Group:  Engaged  Modes of Intervention:  Activity  Additional Comments:    Tierany Appleby 01/23/2024, 11:28 AM

## 2024-01-23 NOTE — Group Note (Signed)
 Recreation Therapy Group Note   Group Topic:Relaxation  Group Date: 01/23/2024 Start Time: 1530 End Time: 1610 Facilitators: Celestia Jeoffrey BRAVO, LRT, CTRS Location: Craft Room  Group Description: PMR (Progressive Muscle Relaxation). LRT educates patients on what PMR is and the benefits that come from it. Patients are asked to sit with their feet flat on the floor while sitting up and all the way back in their chair, if possible. LRT and pts follow a prompt through a speaker that requires you to tense and release different muscles in their body and focus on their breathing. During session, lights are off and soft music is being played.   Goal Area(s) Addressed:  Patients will be able to describe progressive muscle relaxation.  Patient will practice using relaxation technique. Patient will identify a new coping skill.  Patient will follow multistep directions to reduce anxiety and stress.   Affect/Mood: N/A   Participation Level: Did not attend    Clinical Observations/Individualized Feedback: Patient did not attend group.   Plan: Continue to engage patient in RT group sessions 2-3x/week.   Jeoffrey BRAVO Celestia, LRT, CTRS 01/23/2024 5:13 PM

## 2024-01-23 NOTE — H&P (Signed)
 Psychiatric Admission Assessment Adult  Patient Identification: Dustin Bennett MRN:  [id number] Date of Evaluation:  01/23/2024 Chief Complaint:  Substance induced mood disorder (HCC) [F19.94]   History of Present Illness: Dustin Bennett is a 38 y.o. male  Past medical history of no known past medical history but self-reported depression, coming home to [place]  from [facility], here for depression and suicidal ideation.  He called police for help voluntarily to come to the hospital to seek psychiatric help.He states he has been suicidal and has had thoughts about overdosing but has not harmed himself or overdosed.Patient is admitted to adult psych unit with Q15 min safety monitoring. Multidisciplinary team approach is offered. Medication management; group/milieu therapy is offered.   Patient reports that he was recently discharged from the hospital in [place] and went back to [place].  He reports coming back to [place]  as his big brother is going to jail today.  He reports that his family is gathering to be able to spend some time with the big brother who patient describes as the biggest support in the family and taking care of everybody's problems.  Patient reports feeling overwhelmed with everything going on and needing to step in for his family after his big brother goes to jail for few months.  He reports that his brother came to the ED to check on him last night.  Patient is currently denying SI/HI/plan at this time saying he needs to go home to be there for his family as his older brother who is the family head went to jail today morning. He denies mania/hypomania.He denies auditory/visual hallucinations. He denies anxiety, panic attacks. He is not displaying any grandiose delusions. He reports having appointment with SSN office to get his ID and has a job Copy. He is future oriented  and want to go home to help his mom.  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 No.  Has the patient been a risk to self in the past 6 months? No.  Has the patient been a risk to self within the distant past? No.  Is the patient a risk to others? No.  Has the patient been a risk to others in the past 6 months? No.  Has the patient been a risk to others within the distant past? No.   [place] Scale:  Flowsheet Row Admission (Current) from 01/22/2024 in [facility] INPATIENT BEH[facility]AL MEDICINE Most recent reading at 01/22/2024  2:00 PM ED from 01/22/2024 in [facility] Emergency Department at [facility] Most recent reading at 01/22/2024  2:40 AM  C-SSRS RISK CATEGORY Moderate Risk High Risk  Past Medical History:  Past Medical History:  Diagnosis Date   HIV (human immunodeficiency virus infection) (HCC)    History reviewed. No pertinent surgical history. Family History: History reviewed. No pertinent family history.  Social History:  Social History   Substance and Sexual Activity   Alcohol Use Yes   Comment: 2-4 monthly     Social History   Substance and Sexual Activity  Drug Use Yes   Types: Cocaine, Methamphetamines, Marijuana      Allergies:   Allergies  Allergen Reactions   Seroquel [Quetiapine] Hives   Wellbutrin [Bupropion] Hives   Zyprexa [Olanzapine] Hives   Lab Results:  Results for orders placed or performed during the hospital encounter of 01/22/24 (from the past 48 hours)  CBC with Differential     Status: Abnormal   Collection Time: 01/22/24  2:44 AM  Result Value Ref Range   WBC 3.2 (L) 4.0 - 10.5 K/uL   RBC 4.68 4.22 - 5.81 MIL/uL   Hemoglobin 14.5 13.0 - 17.0 g/dL   HCT 57.9 60.9 - 47.9 %   MCV 89.7 80.0 - 100.0 fL   MCH 31.0 26.0 - 34.0 pg   MCHC 34.5 30.0 - 36.0 g/dL   RDW 86.0 88.4 - 84.4 %   Platelets 187 150 - 400 K/uL   nRBC 0.0 0.0 - 0.2 %   Neutrophils Relative % 45 %   Neutro Abs 1.5 (L) 1.7 - 7.7 K/uL   Lymphocytes Relative 43 %   Lymphs Abs 1.4 0.7 - 4.0 K/uL   Monocytes Relative 10 %   Monocytes Absolute 0.3 0.1 - 1.0 K/uL   Eosinophils Relative 1 %   Eosinophils Absolute 0.0 0.0 - 0.5 K/uL   Basophils Relative 1 %   Basophils Absolute 0.0 0.0 - 0.1 K/uL   Immature Granulocytes 0 %   Abs Immature Granulocytes 0.01 0.00 - 0.07 K/uL    Comment: Performed at [facility], [address] [place], [place] [address]  Ethanol     Status: Abnormal   Collection Time: 01/22/24  2:44 AM  Result Value Ref Range   Alcohol, Ethyl (B) 27 (H) <15 mg/dL    Comment: (NOTE) For medical purposes only. Performed at [facility], [address] [place], [place] [address]   Comprehensive metabolic panel     Status: Abnormal   Collection Time: 01/22/24  2:44 AM  Result Value Ref Range   Sodium 136 135 - 145 mmol/L   Potassium 3.2 (L) 3.5 - 5.1 mmol/L   Chloride 105 98 - 111 mmol/L   CO2 22 22 - 32 mmol/L   Glucose, Bld 99 70 - 99 mg/dL    Comment: Glucose reference range applies only to samples taken  after fasting for at least 8 hours.   BUN 6 6 - 20 mg/dL   Creatinine, Ser 9.01 0.61 - 1.24 mg/dL   Calcium 9.4 8.9 - 89.6 mg/dL   Total Protein 7.0 6.5 - 8.1 g/dL   Albumin 4.0 3.5 - 5.0 g/dL   AST 25 15 - 41 U/L   ALT 25 0 - 44 U/L   Alkaline Phosphatase 43 38 - 126 U/L   Total Bilirubin 0.6 0.0 - 1.2 mg/dL   GFR, Estimated >39 >39 mL/min    Comment: (NOTE) Calculated using the CKD-EPI Creatinine Equation (2021)    Anion gap 9 5 - 15    Comment: Performed at [facility], [address] [place], [place] [address]  Salicylate  level     Status: Abnormal   Collection Time: 01/22/24  2:44 AM  Result Value Ref Range   Salicylate Lvl <7.0 (L) 7.0 - 30.0 mg/dL    Comment: Performed at [facility], [address] [place], [place] [address]  Acetaminophen level     Status: Abnormal   Collection Time: 01/22/24  2:44 AM  Result Value Ref Range   Acetaminophen (Tylenol), Serum <10 (L) 10 - 30 ug/mL    Comment: (NOTE) Therapeutic concentrations vary significantly. A range of 10-30 ug/mL  may be an effective concentration for many patients. However, some  are best treated at concentrations outside of this range. Acetaminophen concentrations >150 ug/mL at 4 hours after ingestion  and >50 ug/mL at 12 hours after ingestion are often associated with  toxic reactions.  Performed at [facility], [address] [place], [place] [address]   Urine Drug Screen, Qualitative     Status: Abnormal   Collection Time: 01/22/24  8:45 AM  Result Value Ref Range   Tricyclic, Ur Screen NONE DETECTED NONE DETECTED   Amphetamines, Ur Screen POSITIVE (A) NONE DETECTED    Comment: (NOTE) Trazodone is metabolized in vivo to several metabolites, including pharmacologically active m-CPP, which is excreted in the urine. Immunoassay screens for amphetamines and MDMA have potential cross-reactivity with these compounds and may provide false positive  results.     MDMA  (Ecstasy)Ur Screen NONE DETECTED NONE DETECTED   Cocaine Metabolite,Ur [place] POSITIVE (A) NONE DETECTED   Opiate, Ur Screen NONE DETECTED NONE DETECTED   Phencyclidine (PCP) Ur S NONE DETECTED NONE DETECTED   Cannabinoid 50 Ng, Ur [place] NONE DETECTED NONE DETECTED   Barbiturates, Ur Screen NONE DETECTED NONE DETECTED   Benzodiazepine, Ur Scrn NONE DETECTED NONE DETECTED   Methadone Scn, Ur NONE DETECTED NONE DETECTED    Comment: (NOTE) Tricyclics + metabolites, urine    Cutoff 1000 ng/mL Amphetamines + metabolites, urine  Cutoff 1000 ng/mL MDMA (Ecstasy), urine              Cutoff 500 ng/mL Cocaine Metabolite, urine          Cutoff 300 ng/mL Opiate + metabolites, urine        Cutoff 300 ng/mL Phencyclidine (PCP), urine         Cutoff 25 ng/mL Cannabinoid, urine                 Cutoff 50 ng/mL Barbiturates + metabolites, urine  Cutoff 200 ng/mL Benzodiazepine, urine              Cutoff 200 ng/mL Methadone, urine                   Cutoff 300 ng/mL  The urine drug screen provides only a preliminary, unconfirmed analytical test result and should not be used for non-medical purposes. Clinical consideration and professional judgment should be applied to any positive drug screen result due to possible interfering substances. A more specific alternate chemical method must be used in order to obtain a confirmed analytical result. Gas chromatography / mass spectrometry (GC/MS) is the preferred confirm atory method. Performed at [facility], [address] [place], [place] [address]     Blood Alcohol level:  Lab Results  Component Value Date   ETH 27 (H) 01/22/2024    Metabolic Disorder Labs:  No results found for: HGBA1C, MPG No results found for: PROLACTIN No results found for: CHOL, TRIG, HDL, CHOLHDL, VLDL, LDLCALC  Current Medications: Current Facility-Administered Medications  Medication Dose Route Frequency Provider Last Rate Last Admin    acetaminophen (TYLENOL) tablet 650 mg  650 mg Oral Q6H PRN Mills, Shnese E, NP       alum & mag hydroxide-simeth (MAALOX/MYLANTA) 200-200-20 MG/5ML suspension 30 mL  30 mL Oral Q4H PRN Mills, Shnese E, NP       haloperidol (HALDOL) tablet 5 mg  5 mg Oral TID PRN Mills, Shnese E, NP       And   diphenhydrAMINE (BENADRYL) capsule 50 mg  50 mg Oral TID PRN Mills, Shnese E, NP       haloperidol lactate (HALDOL) injection 5 mg  5 mg Intramuscular TID PRN Mills, Shnese E, NP       And   diphenhydrAMINE (BENADRYL) injection 50 mg  50 mg Intramuscular TID PRN Mills, Shnese E, NP       And   LORazepam (ATIVAN) injection 2 mg  2 mg Intramuscular TID PRN Mills, Shnese E, NP       haloperidol lactate (HALDOL) injection 10 mg  10 mg Intramuscular TID PRN Mills, Shnese E, NP       And   diphenhydrAMINE (BENADRYL) injection 50 mg  50 mg Intramuscular TID PRN Mills, Shnese E, NP       And   LORazepam (ATIVAN) injection 2 mg  2 mg Intramuscular TID PRN Mills, Shnese E, NP       gabapentin (NEURONTIN) capsule 200 mg  200 mg Oral BID Mills, Shnese E, NP   200 mg at 01/23/24 1034   magnesium hydroxide (MILK OF MAGNESIA) suspension 30 mL  30 mL Oral Daily PRN Mills, Shnese E, NP       nicotine (NICODERM CQ - dosed in mg/24 hours) patch 14 mg  14 mg Transdermal Daily Mills, Shnese E, NP       sertraline (ZOLOFT) tablet 50 mg  50 mg Oral Daily Mills, Shnese E, NP   50 mg at 01/23/24 1035   PTA Medications: No medications prior to admission.    Psychiatric Specialty Exam:  Presentation  General Appearance:  Appropriate for Environment; Casual  Eye Contact: Fair  Speech: Clear and Coherent  Speech Volume: Normal    Mood and Affect  Mood: Anxious  Affect: Appropriate   Thought Process  Thought Processes: Coherent  Descriptions of Associations:Intact  Orientation:Full (Time, Place and Person)  Thought Content:Logical  Hallucinations:Hallucinations: None  Ideas of  Reference:None  Suicidal Thoughts:Suicidal Thoughts: No  Homicidal Thoughts:Homicidal Thoughts: No   Sensorium  Memory: Immediate Fair; Remote Fair; Recent Fair  Judgment: Fair  Insight: Shallow   Executive Functions  Concentration: Fair  Attention Span: Fair  Recall: [employer] of Knowledge: Fair  Language: Fair   Psychomotor Activity  Psychomotor Activity: Psychomotor Activity: Normal   Assets  Assets: Communication Skills; Desire for Improvement; Resilience; Social Support    Musculoskeletal: Strength & Muscle Tone: within normal limits Gait & Station: normal  Physical Exam: Physical Exam Vitals and nursing note reviewed.  HENT:     Head: Normocephalic.   Cardiovascular:     Rate and Rhythm: Normal rate.     Pulses: Normal pulses.  Pulmonary:     Breath sounds: Normal breath sounds.   Neurological:     Mental Status: He is alert.    Review of Systems  Constitutional: Negative.   HENT: Negative.    Eyes: Negative.   Skin: Negative.    Blood pressure 102/74, pulse ROLLEN)  59, temperature (!) 97.2 F (36.2 C), resp. rate 18, SpO2 100%. There is no height or weight on file to calculate BMI.  Principal Diagnosis: Substance induced mood disorder (HCC) Diagnosis:  Principal Problem:   Substance induced mood disorder (HCC)   Clinical Decision Making: Patient with hx of depression and polysubstance admitted for suicidal ideation in the context of social stressors of big brother going to jail.  Treatment Plan Summary:  Safety and Monitoring:             -- Voluntary admission to inpatient psychiatric unit for safety, stabilization and treatment             -- Daily contact with patient to assess and evaluate symptoms and progress in treatment             -- Patient's case to be discussed in multi-disciplinary team meeting             -- Observation Level: q15 minute checks             -- Vital signs:  q12 hours             -- Precautions:  suicide, elopement, and assault   2. Psychiatric Diagnoses and Treatment:              Zoloft 50 mg daily     -- The risks/benefits/side-effects/alternatives to this medication were discussed in detail with the patient and time was given for questions. The patient consents to medication trial.                -- Metabolic profile and EKG monitoring obtained while on an atypical antipsychotic (BMI: Lipid Panel: HbgA1c: QTc:)              -- Encouraged patient to participate in unit milieu and in scheduled group therapies                            3. Medical Issues Being Addressed:      4. Discharge Planning:              -- Social work and case management to assist with discharge planning and identification of hospital follow-up needs prior to discharge             -- Estimated LOS: 5-7 days             -- Discharge Concerns: Need to establish a safety plan; Medication compliance and effectiveness             -- Discharge Goals: Return home with outpatient referrals follow ups  Physician Treatment Plan for Primary Diagnosis: Substance induced mood disorder (HCC) Long Term Goal(s): Improvement in symptoms so as ready for discharge  Short Term Goals: Ability to identify changes in lifestyle to reduce recurrence of condition will improve, Ability to verbalize feelings will improve, Ability to disclose and discuss suicidal ideas, Ability to demonstrate self-control will improve, and Ability to identify and develop effective coping behaviors will improve  Physician Treatment Plan for Secondary Diagnosis: Principal Problem:   Substance induced mood disorder (HCC)  Long Term Goal(s): Improvement in symptoms so as ready for discharge  Short Term Goals: Ability to identify changes in lifestyle to reduce recurrence of condition will improve, Ability to verbalize feelings will improve, Ability to disclose and discuss suicidal ideas, Ability to demonstrate self-control will improve, and Ability to  identify and develop effective coping behaviors will improve  I certify that inpatient services furnished can reasonably be expected to improve the patient's condition.    Farris Blash, MD 6/30/20255:08 PM

## 2024-01-23 NOTE — Progress Notes (Signed)
 Patient sleeping. Will administer morning medications when patient awakens.

## 2024-01-23 NOTE — Group Note (Signed)
 Date:  01/23/2024 Time:  8:22 PM  Group Topic/Focus:  Wellness Toolbox:   The focus of this group is to discuss various aspects of wellness, balancing those aspects and exploring ways to increase the ability to experience wellness.  Patients will create a wellness toolbox for use upon discharge.    Participation Level:  Did Not Attend  Participation Quality:  none  Affect:  none  Cognitive:  none  Insight: None  Engagement in Group:  none  Modes of Intervention:  none  Additional Comments:  none   Kerri Katz 01/23/2024, 8:22 PM

## 2024-01-23 NOTE — Group Note (Signed)
 [facility] LCSW Group Therapy Note    Group Date: 01/23/2024 Start Time: 1300 End Time: 1400  Type of Therapy and Topic:  Group Therapy:  Overcoming Obstacles  Participation Level:  [facility] PARTICIPATION LEVEL: Did Not Attend  Mood:  Description of Group:   In this group patients will be encouraged to explore what they see as obstacles to their own wellness and recovery. They will be guided to discuss their thoughts, feelings, and behaviors related to these obstacles. The group will process together ways to cope with barriers, with attention given to specific choices patients can make. Each patient will be challenged to identify changes they are motivated to make in order to overcome their obstacles. This group will be process-oriented, with patients participating in exploration of their own experiences as well as giving and receiving support and challenge from other group members.  Therapeutic Goals: 1. Patient will identify personal and current obstacles as they relate to admission. 2. Patient will identify barriers that currently interfere with their wellness or overcoming obstacles.  3. Patient will identify feelings, thought process and behaviors related to these barriers. 4. Patient will identify two changes they are willing to make to overcome these obstacles:    Summary of Patient Progress   X   Therapeutic Modalities:   Cognitive Behavioral Therapy Solution Focused Therapy Motivational Interviewing Relapse Prevention Therapy   Sherryle JINNY Margo, LCSW

## 2024-01-23 NOTE — Plan of Care (Signed)
  Problem: Education: Goal: Emotional status will improve Outcome: Progressing Goal: Mental status will improve Outcome: Progressing   Problem: Activity: Goal: Interest or engagement in activities will improve Outcome: Progressing   Problem: Health Behavior/Discharge Planning: Goal: Compliance with treatment plan for underlying cause of condition will improve Outcome: Not Progressing   Problem: Physical Regulation: Goal: Ability to maintain clinical measurements within normal limits will improve Outcome: Progressing   Problem: Safety: Goal: Periods of time without injury will increase Outcome: Progressing

## 2024-01-23 NOTE — BH IP Treatment Plan (Unsigned)
 Interdisciplinary Treatment and Diagnostic Plan Update  01/23/2024 Time of Session: 10:30 AM CONROY GORACKE MRN: [id number]  Principal Diagnosis: Substance induced mood disorder (HCC)  Secondary Diagnoses: Principal Problem:   Substance induced mood disorder (HCC)   Current Medications:  Current Facility-Administered Medications  Medication Dose Route Frequency Provider Last Rate Last Admin   acetaminophen (TYLENOL) tablet 650 mg  650 mg Oral Q6H PRN Mills, Shnese E, NP       alum & mag hydroxide-simeth (MAALOX/MYLANTA) 200-200-20 MG/5ML suspension 30 mL  30 mL Oral Q4H PRN Mills, Shnese E, NP       haloperidol (HALDOL) tablet 5 mg  5 mg Oral TID PRN Mills, Shnese E, NP       And   diphenhydrAMINE (BENADRYL) capsule 50 mg  50 mg Oral TID PRN Mills, Shnese E, NP       haloperidol lactate (HALDOL) injection 5 mg  5 mg Intramuscular TID PRN Mills, Shnese E, NP       And   diphenhydrAMINE (BENADRYL) injection 50 mg  50 mg Intramuscular TID PRN Mills, Shnese E, NP       And   LORazepam (ATIVAN) injection 2 mg  2 mg Intramuscular TID PRN Mills, Shnese E, NP       haloperidol lactate (HALDOL) injection 10 mg  10 mg Intramuscular TID PRN Mills, Shnese E, NP       And   diphenhydrAMINE (BENADRYL) injection 50 mg  50 mg Intramuscular TID PRN Mills, Shnese E, NP       And   LORazepam (ATIVAN) injection 2 mg  2 mg Intramuscular TID PRN Mills, Shnese E, NP       gabapentin (NEURONTIN) capsule 200 mg  200 mg Oral BID Mills, Shnese E, NP   200 mg at 01/23/24 1034   magnesium hydroxide (MILK OF MAGNESIA) suspension 30 mL  30 mL Oral Daily PRN Mills, Shnese E, NP       nicotine (NICODERM CQ - dosed in mg/24 hours) patch 14 mg  14 mg Transdermal Daily Mills, Shnese E, NP       sertraline (ZOLOFT) tablet 50 mg  50 mg Oral Daily Mills, Shnese E, NP   50 mg at 01/23/24 1035   PTA Medications: No medications prior to admission.    Patient Stressors:    Patient Strengths:    Treatment Modalities:  Medication Management, Group therapy, Case management,  1 to 1 session with clinician, Psychoeducation, Recreational therapy.   Physician Treatment Plan for Primary Diagnosis: Substance induced mood disorder (HCC) Long Term Goal(s):     Short Term Goals:    Medication Management: Evaluate patient's response, side effects, and tolerance of medication regimen.  Therapeutic Interventions: 1 to 1 sessions, Unit Group sessions and Medication administration.  Evaluation of Outcomes: Not Met  Physician Treatment Plan for Secondary Diagnosis: Principal Problem:   Substance induced mood disorder (HCC)  Long Term Goal(s):     Short Term Goals:       Medication Management: Evaluate patient's response, side effects, and tolerance of medication regimen.  Therapeutic Interventions: 1 to 1 sessions, Unit Group sessions and Medication administration.  Evaluation of Outcomes: Not Met   RN Treatment Plan for Primary Diagnosis: Substance induced mood disorder (HCC) Long Term Goal(s): Knowledge of disease and therapeutic regimen to maintain health will improve  Short Term Goals: Ability to verbalize frustration and anger appropriately will improve, Ability to demonstrate self-control, Ability to participate in decision making will improve, Ability to  verbalize feelings will improve, Ability to disclose and discuss suicidal ideas, and Ability to identify and develop effective coping behaviors will improve  Medication Management: RN will administer medications as ordered by provider, will assess and evaluate patient's response and provide education to patient for prescribed medication. RN will report any adverse and/or side effects to prescribing provider.  Therapeutic Interventions: 1 on 1 counseling sessions, Psychoeducation, Medication administration, Evaluate responses to treatment, Monitor vital signs and CBGs as ordered, Perform/monitor CIWA, COWS, AIMS and Fall Risk screenings as ordered,  Perform wound care treatments as ordered.  Evaluation of Outcomes: Not Met   LCSW Treatment Plan for Primary Diagnosis: Substance induced mood disorder (HCC) Long Term Goal(s): Safe transition to appropriate next level of care at discharge, Engage patient in therapeutic group addressing interpersonal concerns.  Short Term Goals: Engage patient in aftercare planning with referrals and resources, Increase social support, Increase ability to appropriately verbalize feelings, Increase emotional regulation, Facilitate acceptance of mental health diagnosis and concerns, Facilitate patient progression through stages of change regarding substance use diagnoses and concerns, Identify triggers associated with mental health/substance abuse issues, and Increase skills for wellness and recovery  Therapeutic Interventions: Assess for all discharge needs, 1 to 1 time with Social worker, Explore available resources and support systems, Assess for adequacy in community support network, Educate family and significant other(s) on suicide prevention, Complete Psychosocial Assessment, Interpersonal group therapy.  Evaluation of Outcomes: Not Met   Progress in Treatment: Attending groups: No. Participating in groups: No. Taking medication as prescribed: Yes. Toleration medication: Yes. Family/Significant other contact made: No, will contact:  CSW to contact once permission is granted.  Patient understands diagnosis: No. Discussing patient identified problems/goals with staff: No. Medical problems stabilized or resolved: Yes. Denies suicidal/homicidal ideation: Yes. Issues/concerns per patient self-inventory: No. Other: None  New problem(s) identified: Yes, Describe:  Patient is not willing to engage in programming. Patient does not identify a need to be in treatment.   New Short Term/Long Term Goal(s):detox, elimination of symptoms of psychosis, medication management for mood stabilization; elimination of SI  thoughts; development of comprehensive mental wellness/sobriety plan.    Patient Goals:  I just want to go home and finish my plan.   Discharge Plan or Barriers: CSW to assist with the development of appropriate discharge plan.    Reason for Continuation of Hospitalization: Aggression Anxiety Delusions  Depression Medical Issues Medication stabilization Suicidal ideation Withdrawal symptoms  Estimated Length of Stay: 1-7 days.   Last 3 [place] Suicide Severity Risk Score: Flowsheet Row Admission (Current) from 01/22/2024 in [facility] Most recent reading at 01/22/2024  2:00 PM ED from 01/22/2024 in [facility] at [facility] Most recent reading at 01/22/2024  2:40 AM  C-SSRS RISK CATEGORY Moderate Risk High Risk    Last PHQ 2/9 Scores:     No data to display          Scribe for Treatment Team: Alveta CHRISTELLA Kerns, LCSW 01/23/2024 10:48 AM

## 2024-01-23 NOTE — Progress Notes (Signed)
 Patient in bed throughout the shift.  Reports feeling good, denied AVH, SI/HI, and depression.  Endorse anxiety 2/10.       01/22/24 2200  Psych Admission Type (Psych Patients Only)  Admission Status Involuntary  Psychosocial Assessment  Patient Complaints None  Eye Contact Fair  Facial Expression Flat  Affect Appropriate to circumstance  Speech Logical/coherent  Interaction Minimal  Motor Activity Slow  Appearance/Hygiene In scrubs  Behavior Characteristics Calm;Appropriate to situation  Mood Pleasant  Thought Process  Coherency WDL  Content WDL  Delusions None reported or observed  Perception WDL  Hallucination None reported or observed  Judgment Limited  Confusion WDL  Danger to Self  Current suicidal ideation? Denies  Agreement Not to Harm Self Yes  Description of Agreement Verbal  Danger to Others  Danger to Others None reported or observed    Problem: Education: Goal: Knowledge of [place] [facility] Education information/materials will improve Outcome: Progressing Goal: Emotional status will improve Outcome: Progressing Goal: Mental status will improve Outcome: Progressing Goal: Verbalization of understanding the information provided will improve Outcome: Progressing   Problem: Activity: Goal: Interest or engagement in activities will improve Outcome: Progressing Goal: Sleeping patterns will improve Outcome: Progressing   Problem: Coping: Goal: Ability to verbalize frustrations and anger appropriately will improve Outcome: Progressing Goal: Ability to demonstrate self-control will improve Outcome: Progressing

## 2024-01-24 ENCOUNTER — Encounter: Payer: Self-pay | Admitting: Psychiatry

## 2024-01-24 DIAGNOSIS — Z5900 Homelessness unspecified: Secondary | ICD-10-CM | POA: Diagnosis not present

## 2024-01-24 DIAGNOSIS — Z888 Allergy status to other drugs, medicaments and biological substances status: Secondary | ICD-10-CM | POA: Diagnosis not present

## 2024-01-24 DIAGNOSIS — Z5982 Transportation insecurity: Secondary | ICD-10-CM | POA: Diagnosis not present

## 2024-01-24 DIAGNOSIS — Z79899 Other long term (current) drug therapy: Secondary | ICD-10-CM | POA: Diagnosis not present

## 2024-01-24 DIAGNOSIS — F17210 Nicotine dependence, cigarettes, uncomplicated: Secondary | ICD-10-CM | POA: Diagnosis not present

## 2024-01-24 DIAGNOSIS — F1994 Other psychoactive substance use, unspecified with psychoactive substance-induced mood disorder: Secondary | ICD-10-CM | POA: Diagnosis present

## 2024-01-24 DIAGNOSIS — F17290 Nicotine dependence, other tobacco product, uncomplicated: Secondary | ICD-10-CM | POA: Diagnosis not present

## 2024-01-24 DIAGNOSIS — Z818 Family history of other mental and behavioral disorders: Secondary | ICD-10-CM | POA: Diagnosis not present

## 2024-01-24 DIAGNOSIS — R45851 Suicidal ideations: Secondary | ICD-10-CM | POA: Diagnosis not present

## 2024-01-24 DIAGNOSIS — Z21 Asymptomatic human immunodeficiency virus [HIV] infection status: Secondary | ICD-10-CM | POA: Diagnosis not present

## 2024-01-24 MED ORDER — SERTRALINE HCL 50 MG PO TABS: 50.0000 mg | ORAL_TABLET | Freq: Every day | ORAL | 0 refills | Status: AC

## 2024-01-24 MED ORDER — GABAPENTIN 100 MG PO CAPS: 200.0000 mg | ORAL_CAPSULE | Freq: Two times a day (BID) | ORAL | 0 refills | Status: AC

## 2024-01-24 MED ORDER — NICOTINE 14 MG/24HR TD PT24: 14.0000 mg | MEDICATED_PATCH | Freq: Every day | TRANSDERMAL | 0 refills | Status: AC

## 2024-01-24 NOTE — [facility] Suicide Risk Assessment (Signed)
 [facility] INPATIENT:  Family/Significant Other Suicide Prevention Education  Suicide Prevention Education:  Education Completed; Barnie South, [phone], Mother, has been identified by the patient as the family member/significant other with whom the patient will be residing, and identified as the person(s) who will aid the patient in the event of a mental health crisis (suicidal ideations/suicide attempt).  With written consent from the patient, the family member/significant other has been provided the following suicide prevention education, prior to the and/or following the discharge of the patient.  The suicide prevention education provided includes the following: Suicide risk factors Suicide prevention and interventions National Suicide Hotline telephone number [facility] assessment telephone number [facility] Emergency Assistance 911 [facility] and/or Residential Mobile Crisis Unit telephone number  Request made of family/significant other to: Remove weapons (e.g., guns, rifles, knives), all items previously/currently identified as safety concern.   Remove drugs/medications (over-the-counter, prescriptions, illicit drugs), all items previously/currently identified as a safety concern.  The family member/significant other verbalizes understanding of the suicide prevention education information provided.  The family member/significant other agrees to remove the items of safety concern listed above.  Patient's mother confirmed that he does reside with her. She reports that the patient's recent episode was caused by substance use but that she is usually able to "calm him." Mother reports that the patient has no access to weapons in her home. Mother is aware of patient's upcoming appointment with [facility] and reports that she is going to encourage him to keep his appointment and work towards treatment. Mother expressed no safety concerns and reports that the patient can discharge  to her home.   Leida Luton M Ladaysha Soutar 01/24/2024, 9:26 AM

## 2024-01-24 NOTE — Progress Notes (Signed)
 Patient pleasant and cooperative on approach. Denies SI,HI and AVH. Verbalized understanding discharge instructions,prescriptions and follow up care. All belongings returned from [employer]. Suicide safety plan filled by patient and placed in chart. Copy given to patient.Patient escorted out by staff and transported by cab.

## 2024-01-24 NOTE — Plan of Care (Signed)
  Problem: Activity: Goal: Interest or engagement in activities will improve Outcome: Progressing Goal: Sleeping patterns will improve Outcome: Progressing   Problem: Education: Goal: Knowledge of [place] [facility] Education information/materials will improve Outcome: Progressing

## 2024-01-24 NOTE — [facility] Suicide Risk Assessment (Signed)
 [facility] Discharge Suicide Risk Assessment   Principal Problem: Substance induced mood disorder (HCC) Discharge Diagnoses: Principal Problem:   Substance induced mood disorder (HCC)   Total Time spent with patient: 30 minutes  Musculoskeletal: Strength & Muscle Tone: within normal limits Gait & Station: normal Patient leans: N/A  Psychiatric Specialty Exam  Presentation  General Appearance:  Appropriate for Environment; Casual  Eye Contact: Fair  Speech: Clear and Coherent  Speech Volume: Normal  Handedness: Right   Mood and Affect  Mood: Euthymic  Duration of Depression Symptoms: No data recorded Affect: Appropriate   Thought Process  Thought Processes: Coherent  Descriptions of Associations:Intact  Orientation:Full (Time, Place and Person)  Thought Content:Logical  History of Schizophrenia/Schizoaffective disorder:No data recorded Duration of Psychotic Symptoms:No data recorded Hallucinations:Hallucinations: None  Ideas of Reference:None  Suicidal Thoughts:Suicidal Thoughts: No  Homicidal Thoughts:Homicidal Thoughts: No   Sensorium  Memory: Recent Fair; Immediate Fair; Remote Fair  Judgment: Fair  Insight: Fair   Art therapist  Concentration: Fair  Attention Span: Fair  Recall: [employer] of Knowledge: Fair  Language: Fair   Psychomotor Activity  Psychomotor Activity: Psychomotor Activity: Normal   Assets  Assets: Communication Skills; Desire for Improvement; Resilience; Social Support   Sleep  Sleep: Sleep: Fair  Estimated Sleeping Duration (Last 24 Hours): 11.25-13.50 hours  Physical Exam: Physical Exam ROS Blood pressure 121/71, pulse (!) 54, temperature 98.1 F (36.7 C), resp. rate 20, SpO2 98%. There is no height or weight on file to calculate BMI.  Mental Status Per Nursing Assessment::   On Admission:  NA  Demographic Factors:  Male and Low socioeconomic status  Loss Factors: Decrease  in vocational status  Historical Factors: NA  Risk Reduction Factors:   Living with another person, especially a relative, Positive social support, Positive therapeutic relationship, and Positive coping skills or problem solving skills  Continued Clinical Symptoms:  Depression:   Comorbid alcohol abuse/dependence  Cognitive Features That Contribute To Risk:  None    Suicide Risk:  Minimal: No identifiable suicidal ideation.  Patients presenting with no risk factors but with morbid ruminations; may be classified as minimal risk based on the severity of the depressive symptoms   Follow-up Information     [facility], [facility]. Go to.   Why: In person assessment for therapy annd medication management 01/30/24 at 8 AM. Contact information: [address] [place] [address] [phone]                 Plan Of Care/Follow-up recommendations:  Activity:  As tolerated  Allyn Foil, MD 01/24/2024, 10:40 AM

## 2024-01-24 NOTE — Progress Notes (Signed)
   01/23/24 2000  Psych Admission Type (Psych Patients Only)  Admission Status Involuntary  Psychosocial Assessment  Patient Complaints None  Eye Contact Fair  Facial Expression Flat  Affect Appropriate to circumstance  Speech Logical/coherent  Interaction Minimal  Motor Activity Slow  Appearance/Hygiene In scrubs  Behavior Characteristics Cooperative  Mood Pleasant  Aggressive Behavior  Effect No apparent injury  Thought Process  Coherency WDL  Content WDL  Delusions None reported or observed  Perception WDL  Hallucination None reported or observed  Judgment Poor  Confusion None  Danger to Self  Current suicidal ideation? Denies  Agreement Not to Harm Self Yes  Danger to Others  Danger to Others None reported or observed

## 2024-01-24 NOTE — Progress Notes (Signed)
  [facility] Adult Case Management Discharge Plan :  Will you be returning to the same living situation after discharge:  Yes,  Patient to return home.  At discharge, do you have transportation home?: Yes,  CSW has arranged transportation on patient's behalf.  Do you have the ability to pay for your medications: Yes,  [facility]. Patient has also been referred to [facility] and has an appointment to help with medication management.   Release of information consent forms completed and in the chart;  Patient's signature needed at discharge.  Patient to Follow up at:  Follow-up Information     [facility], [facility]. Go to.   Why: In person assessment for therapy annd medication management 01/30/24 at 8 AM. Contact information: [address] [place] [address] [phone]                 Next level of care provider has access to [facility] Link:no  Safety Planning and Suicide Prevention discussed: Yes,  Education Completed; Barnie South, [phone], Mother, has been identified by the patient as the family member/significant other with whom the patient will be residing, and identified as the person(s) who will aid the patient in the event of a mental health crisis (suicidal ideations/suicide attempt).  With written consent from the patient, the family member/significant other has been provided the following suicide prevention education, prior to the and/or following the discharge of the patient.     Has patient been referred to the Quitline?: Patient refused referral for treatment  Patient has been referred for addiction treatment: Yes, the patient will follow up with an outpatient provider for substance use disorder. Psychiatrist/APP: appointment made and Therapist: appointment made  Alveta CHRISTELLA Kerns, LCSW 01/24/2024, 10:53 AM

## 2024-01-24 NOTE — Discharge Summary (Signed)
 Physician Discharge Summary Note  Patient:  Dustin Bennett is an 38 y.o., male MRN:  [id number] DOB:  08-02-1985 Patient phone:  [phone] (home)  Patient address:   [address] [place] [address],    Date of Admission:  01/22/2024 Date of Discharge: 01/24/24  Reason for Admission:  Dustin Bennett is a 38 y.o. male  Past medical history of no known past medical history but self-reported depression, coming home to [place]  from [facility], here for depression and suicidal ideation.  He called police for help voluntarily to come to the hospital to seek psychiatric help.He states he has been suicidal and has had thoughts about overdosing but has not harmed himself or overdosed.Patient is admitted to adult psych unit with Q15 min safety monitoring. Multidisciplinary team approach is offered. Medication management; group/milieu therapy is offered.   Principal Problem: Substance induced mood disorder (HCC) Discharge Diagnoses: Principal Problem:   Substance induced mood disorder (HCC)   Past Psychiatric History: see h&p  Family Psychiatric  History: see h&p Social History:  Social History   Substance and Sexual Activity  Alcohol Use Yes   Comment: 2-4 monthly     Social History   Substance and Sexual Activity  Drug Use Yes   Types: Cocaine, Methamphetamines, Marijuana    Social History   Socioeconomic History   Marital status: Single    Spouse name: Not on file   Number of children: Not on file   Years of education: Not on file   Highest education level: Not on file  Occupational History   Not on file  Tobacco Use   Smoking status: Every Day    Types: Cigarettes, Cigars   Smokeless tobacco: Never   Tobacco comments:    Smoke 2-4 cigs per day  Substance and Sexual Activity   Alcohol use: Yes    Comment: 2-4 monthly   Drug use: Yes    Types: Cocaine, Methamphetamines, Marijuana   Sexual activity: Not on file  Other Topics Concern   Not on file  Social History  Narrative   Not on file   Social Drivers of Health   Financial Resource Strain: Not on file  Food Insecurity: No Food Insecurity (01/22/2024)   Hunger Vital Sign    Worried About Running Out of Food in the Last Year: Never true    Ran Out of Food in the Last Year: Never true  Transportation Needs: Unmet Transportation Needs (01/22/2024)   PRAPARE - Administrator, Civil Service (Medical): Yes    Lack of Transportation (Non-Medical): No  Physical Activity: Not on file  Stress: Not on file  Social Connections: Not on file   Past Medical History:  Past Medical History:  Diagnosis Date   HIV (human immunodeficiency virus infection) (HCC)    History reviewed. No pertinent surgical history. Family History: History reviewed. No pertinent family history.  Hospital Course:  Dustin Bennett is a 38 y.o. male  Past medical history of no known past medical history but self-reported depression, coming home to [place]  from [facility], here for depression and suicidal ideation.  He called police for help voluntarily to come to the hospital to seek psychiatric help.He states he has been suicidal and has had thoughts about overdosing but has not harmed himself or overdosed.Patient is admitted to adult psych unit with Q15 min safety monitoring. Multidisciplinary team approach is offered. Medication management; group/milieu therapy is offered.  On admission patient was maintained on Zoloft.  Patient  denies having any ongoing SI/HI or depression.  Patient reports that his presentation was mainly under the influence of alcohol and the stress of his brother going to jail.  He reports feeling better and remains future oriented as he wants to go home and help his mother out.  He informed the team that his appointments were all set with [facility] appointment on Monday.  Social worker reached out to mother who confirmed patient having appointments and have no concerns for patient coming back home.  On the  day of discharge patient consistently denies SI/HI/plan and remains future oriented.  Detailed risk assessment is complete based on clinical exam and individual risk factors and acute suicide risk is low and acute violence risk is low.     Currently, all modifiable risk of harm to self/harm to others have been addressed and patient is no longer appropriate for the acute inpatient setting and is able to continue treatment for mental health needs in the community with the supports as indicated below.  Patient is educated and verbalized understanding of discharge plan of care including medications, follow-up appointments, mental health resources and further crisis services in the community.  He is instructed to call 911 or present to the nearest emergency room should he experience any decompensation in mood, disturbance of bowel or return of suicidal/homicidal ideations.  Patient verbalizes understanding of this education and agrees to this plan of care  Physical Findings: AIMS:  , ,  ,  ,    CIWA:    COWS:        Psychiatric Specialty Exam:  Presentation  General Appearance:  Appropriate for Environment; Casual  Eye Contact: Fair  Speech: Clear and Coherent  Speech Volume: Normal    Mood and Affect  Mood: Euthymic  Affect: Appropriate   Thought Process  Thought Processes: Coherent  Descriptions of Associations:Intact  Orientation:Full (Time, Place and Person)  Thought Content:Logical  Hallucinations:Hallucinations: None  Ideas of Reference:None  Suicidal Thoughts:Suicidal Thoughts: No  Homicidal Thoughts:Homicidal Thoughts: No   Sensorium  Memory: Recent Fair; Immediate Fair; Remote Fair  Judgment: Fair  Insight: Fair   Art therapist  Concentration: Fair  Attention Span: Fair  Recall: [employer] of Knowledge: Fair  Language: Fair   Psychomotor Activity  Psychomotor Activity: Psychomotor Activity:  Normal  Musculoskeletal: Strength & Muscle Tone: within normal limits Gait & Station: normal Assets  Assets: Manufacturing systems engineer; Desire for Improvement; Resilience; Social Support   Sleep  Sleep: Sleep: Fair    Physical Exam: Physical Exam Vitals and nursing note reviewed.    ROS Blood pressure 121/71, pulse (!) 54, temperature 98.1 F (36.7 C), resp. rate 20, SpO2 98%. There is no height or weight on file to calculate BMI.   Social History   Tobacco Use  Smoking Status Every Day   Types: Cigarettes, Cigars  Smokeless Tobacco Never  Tobacco Comments   Smoke 2-4 cigs per day   Tobacco Cessation:  A prescription for an FDA-approved tobacco cessation medication provided at discharge   Blood Alcohol level:  Lab Results  Component Value Date   ETH 27 (H) 01/22/2024    Metabolic Disorder Labs:  No results found for: HGBA1C, MPG No results found for: PROLACTIN No results found for: CHOL, TRIG, HDL, CHOLHDL, VLDL, LDLCALC  See Psychiatric Specialty Exam and Suicide Risk Assessment completed by Attending Physician prior to discharge.  Discharge destination:  Home  Is patient on multiple antipsychotic therapies at discharge:  No   Has Patient had  three or more failed trials of antipsychotic monotherapy by history:  No  Recommended Plan for Multiple Antipsychotic Therapies: NA  Discharge Instructions     Diet - low sodium heart healthy   Complete by: As directed    Increase activity slowly   Complete by: As directed       Allergies as of 01/24/2024       Reactions   Seroquel  [quetiapine ] Hives   Wellbutrin [bupropion] Hives   Zyprexa  [olanzapine ] Hives        Medication List     TAKE these medications      Indication  gabapentin 100 MG capsule Commonly known as: NEURONTIN Take 2 capsules (200 mg total) by mouth 2 (two) times daily.  Indication: Generalized Anxiety Disorder   nicotine  14 mg/24hr patch Commonly known as:  NICODERM CQ  - dosed in mg/24 hours Place 1 patch (14 mg total) onto the skin daily. Start taking on: January 25, 2024  Indication: Nicotine  Addiction   sertraline 50 MG tablet Commonly known as: ZOLOFT Take 1 tablet (50 mg total) by mouth daily. Start taking on: January 25, 2024  Indication: Major Depressive Disorder        Follow-up Information     [facility]. Go to.   Why: In person assessment for therapy annd medication management 01/30/24 at 8 AM. Contact information: [address] [place] [address] [phone]                 Follow-up recommendations:  Activity:  As tolerated    Signed: Neeti Knudtson, MD 01/24/2024, 10:40 AM

## 2024-01-24 NOTE — Group Note (Signed)
 Date:  01/24/2024 Time:  10:52 AM  Group Topic/Focus:  Making Healthy Choices:   The focus of this group is to help patients identify negative/unhealthy choices they were using prior to admission and identify positive/healthier coping strategies to replace them upon discharge.    Participation Level:  Did Not Attend   Deitra Clap [facility] 01/24/2024, 10:52 AM

## 2024-02-12 ENCOUNTER — Emergency Department
Admission: EM | Admit: 2024-02-12 | Discharge: 2024-02-12 | Disposition: A | Discharge status: Home / Self Care | Payer: Self-pay | Attending: Emergency Medicine | Admitting: Emergency Medicine

## 2024-02-12 ENCOUNTER — Other Ambulatory Visit: Payer: Self-pay

## 2024-02-12 DIAGNOSIS — Z202 Contact with and (suspected) exposure to infections with a predominantly sexual mode of transmission: Secondary | ICD-10-CM

## 2024-02-12 DIAGNOSIS — R369 Urethral discharge, unspecified: Secondary | ICD-10-CM | POA: Diagnosis not present

## 2024-02-12 DIAGNOSIS — F419 Anxiety disorder, unspecified: Secondary | ICD-10-CM | POA: Diagnosis not present

## 2024-02-12 DIAGNOSIS — F489 Nonpsychotic mental disorder, unspecified: Secondary | ICD-10-CM

## 2024-02-12 DIAGNOSIS — F99 Mental disorder, not otherwise specified: Secondary | ICD-10-CM | POA: Diagnosis present

## 2024-02-12 LAB — COMPREHENSIVE METABOLIC PANEL WITH GFR
ALT: 21 U/L (ref 0–44)
AST: 33 U/L (ref 15–41)
Albumin: 4.3 g/dL (ref 3.5–5.0)
Alkaline Phosphatase: 48 U/L (ref 38–126)
Anion gap: 10 (ref 5–15)
BUN: 15 mg/dL (ref 6–20)
CO2: 21 mmol/L — ABNORMAL LOW (ref 22–32)
Calcium: 9.2 mg/dL (ref 8.9–10.3)
Chloride: 101 mmol/L (ref 98–111)
Creatinine, Ser: 1.12 mg/dL (ref 0.61–1.24)
GFR, Estimated: >60 mL/min (ref >60)
Glucose, Bld: 139 mg/dL — ABNORMAL HIGH (ref 70–99)
Potassium: 3.5 mmol/L (ref 3.5–5.1)
Sodium: 132 mmol/L — ABNORMAL LOW (ref 135–145)
Total Bilirubin: 0.9 mg/dL (ref 0.0–1.2)
Total Protein: 7.3 g/dL (ref 6.5–8.1)

## 2024-02-12 LAB — CBC
HCT: 43.2 % (ref 39.0–52.0)
Hemoglobin: 15.1 g/dL (ref 13.0–17.0)
MCH: 31 pg (ref 26.0–34.0)
MCHC: 35 g/dL (ref 30.0–36.0)
MCV: 88.7 fL (ref 80.0–100.0)
Platelets: 195 K/uL (ref 150–400)
RBC: 4.87 MIL/uL (ref 4.22–5.81)
RDW: 13.5 % (ref 11.5–15.5)
WBC: 3.9 K/uL — ABNORMAL LOW (ref 4.0–10.5)
nRBC: 0 % (ref 0.0–0.2)

## 2024-02-12 LAB — ETHANOL: Alcohol, Ethyl (B): <15 mg/dL (ref <15)

## 2024-02-12 MED ORDER — LIDOCAINE HCL (PF) 1 % IJ SOLN
1.0000 mL | Freq: Once | INTRAMUSCULAR | Status: AC
  Administered 2024-02-12: 2.1 mL
  Filled 2024-02-12: qty 5

## 2024-02-12 MED ORDER — CEFTRIAXONE SODIUM 1 G IJ SOLR
500.0000 mg | Freq: Once | INTRAMUSCULAR | Status: AC
  Administered 2024-02-12: 500 mg via INTRAMUSCULAR
  Filled 2024-02-12: qty 10

## 2024-02-12 MED ORDER — DOXYCYCLINE HYCLATE 100 MG PO CAPS
100.0000 mg | ORAL_CAPSULE | Freq: Two times a day (BID) | ORAL | 0 refills | Status: AC
Start: 2024-02-12 — End: 2024-02-19

## 2024-02-12 NOTE — ED Triage Notes (Signed)
 Pt states that he wants to be evaluated by psychiatrist because he has not taken his psych meds x 2 weeks and his body feels sick. Denies SI/HI ideations. Denies hallucinations

## 2024-02-12 NOTE — ED Notes (Addendum)
 Pt dressed out with RN Elouise , belongings include: Elnor shoes Black pants Blue underwear  No socks White shirt A piece of paper with ID and ss paper states he is getting ss card  **pt states nose piercing does not come out.

## 2024-02-12 NOTE — ED Provider Notes (Signed)
 [facility] Provider Note    Event Date/Time   First MD Initiated Contact with Patient 02/12/24 [phone]     (approximate)   History   Psychiatric Evaluation   HPI Dustin Bennett is a 38 y.o. male who presents for evaluation of possible STDs as well as mental health concerns.  He states he usually takes Zyprexa  but has not been taking it.  He said he wants to speak with a psychiatrist because he thinks my mental health is sick.  He does not have any specific examples.  He denies experiencing hallucinations and he denies having suicidal ideation and homicidal ideation.  He is little bit anxious and agitated but otherwise not complaining of anything in particular.  Regarding STD exposure, he said he has been having unprotected sex and he has had some penile discharge.  Minimal discomfort.     Physical Exam   Triage Vital Signs: ED Triage Vitals  Encounter Vitals Group     BP 02/12/24 0525 (!) 139/98     Girls Systolic BP Percentile --      Girls Diastolic BP Percentile --      Boys Systolic BP Percentile --      Boys Diastolic BP Percentile --      Pulse Rate 02/12/24 0525 (!) 113     Resp 02/12/24 0525 16     Temp 02/12/24 0525 98.6 F (37 C)     Temp Source 02/12/24 0525 Oral     SpO2 02/12/24 0525 96 %     Weight --      Height --      Head Circumference --      Peak Flow --      Pain Score 02/12/24 0527 8     Pain Loc --      Pain Education --      Exclude from Growth Chart --     Most recent vital signs: Vitals:   02/12/24 0525  BP: (!) 139/98  Pulse: (!) 113  Resp: 16  Temp: 98.6 F (37 C)  SpO2: 96%    General: Awake, no distress.  CV:  Good peripheral perfusion.  Resp:  Normal effort. Speaking easily and comfortably, no accessory muscle usage nor intercostal retractions.   Abd:  No distention.  Other:  Patient is maintaining eye contact, answering questions appropriately, not exhibiting any warning signs or symptoms of  emergent mental health crisis.  Denies SI and HI.   ED Results / Procedures / Treatments   Labs (all labs ordered are listed, but only abnormal results are displayed) Labs Reviewed  COMPREHENSIVE METABOLIC PANEL WITH GFR - Abnormal; Notable for the following components:      Result Value   Sodium 132 (*)    CO2 21 (*)    Glucose, Bld 139 (*)    All other components within normal limits  CBC - Abnormal; Notable for the following components:   WBC 3.9 (*)    All other components within normal limits  ETHANOL  URINE DRUG SCREEN, QUALITATIVE ([facility] ONLY)      PROCEDURES:  Critical Care performed: No  Procedures    IMPRESSION / MDM / ASSESSMENT AND PLAN / ED COURSE  I reviewed the triage vital signs and the nursing notes.                              Differential diagnosis includes, but is not limited  to, STD such as gonorrhea or chlamydia, medication noncompliance, substance abuse, mental health disorder  Patient's presentation is most consistent with acute presentation with potential threat to life or bodily function.  Labs/studies ordered: As per protocol, I ordered the following labs as part of the patient's medical and psychiatric evaluation:  CBC, CMP, ethanol level, urine drug screen.  Interventions/Medications given:  Medications  cefTRIAXone  (ROCEPHIN ) injection 500 mg (500 mg Intramuscular Given 02/12/24 0648)  lidocaine  (PF) (XYLOCAINE ) 1 % injection 1-2.1 mL (2.1 mLs Other Given 02/12/24 0646)    (Note:  hospital course my include additional interventions and/or labs/studies not listed above.)   Mild tachycardia, otherwise normal vital signs.  Patient is exhibiting no signs or symptoms of emergent psychiatric issue that requires involuntary commitment or inpatient treatment.  I encouraged the patient to go to [facility] but at this time he would rather stay and speak with a psychiatrist.  I told him it may be hours before he is able to do so and he said that he  understands.  I feel he would be appropriate for outpatient management but since he is wanting to stay I will allow him to do so at this time.  Regarding the STD exposure, the patient is declining exam and declining urine testing.  I told him that I will cancel the urine drug screen but that a urine test is the only way I can check for STDs.  He said he does not want to do it.  I ask him if he wants to be treated empirically with medications and he said yes so I ordered ceftriaxone  500 mg IM and wrote him a prescription for doxycycline  100 mg p.o. twice daily x 7 days.     Clinical Course as of 02/12/24 9276  [name] Feb 12, 2024  0708 Patient changes mind he decided he will be discharged and go to [facility].  His medical screening exam is reassuring and there is no indication he needs inpatient medical or psychiatric treatment.  He does not meet criteria for IVC.  I will discharge for outpatient follow-up and I gave my usual and customary return precautions.  He knows he can return at any time. [CF]    Clinical Course User Index [CF] Gordan Huxley, MD     FINAL CLINICAL IMPRESSION(S) / ED DIAGNOSES   Final diagnoses:  Anxiety  Mental health problem  Possible exposure to STD     Rx / DC Orders   ED Discharge Orders          Ordered    doxycycline  (VIBRAMYCIN ) 100 MG capsule  2 times daily        02/12/24 9362             Note:  This document was prepared using Dragon voice recognition software and may include unintentional dictation errors.   Gordan Huxley, MD 02/12/24 [phone]

## 2024-02-12 NOTE — ED Notes (Addendum)
 Pt declines urine sample. This RN explained process of medical clearance. Still declines, EDP notified.

## 2024-02-12 NOTE — Discharge Instructions (Signed)
 Please pick up your prescription and take the full 7-day course of medication.  This, combined with the injection you received, should treat you for both gonorrhea and chlamydia.  You can also follow-up at the [facility] department for additional testing if you would like to do so.  For your mental health services, we recommend you go to [facility] at the address listed in this paperwork.  They should be able to assist you further.  Remember you can return to the emergency department at anytime if you develop new or worsening symptoms that concern you or if you feel like you or anyone else are in danger.

## 2024-02-14 ENCOUNTER — Telehealth: Payer: Self-pay | Admitting: Family Medicine

## 2024-02-23 ENCOUNTER — Ambulatory Visit: Payer: Self-pay | Admitting: Physician Assistant

## 2024-03-18 ENCOUNTER — Emergency Department
Admission: EM | Admit: 2024-03-18 | Discharge: 2024-03-18 | Disposition: A | Discharge status: Home / Self Care | Payer: 59 | Attending: Emergency Medicine | Admitting: Emergency Medicine

## 2024-03-18 ENCOUNTER — Other Ambulatory Visit: Payer: Self-pay

## 2024-03-18 ENCOUNTER — Encounter: Payer: Self-pay | Admitting: Emergency Medicine

## 2024-03-18 ENCOUNTER — Emergency Department
Admission: EM | Admit: 2024-03-18 | Discharge: 2024-03-19 | Disposition: A | Discharge status: Home / Self Care | Payer: Self-pay | Source: Home / Self Care | Attending: Emergency Medicine | Admitting: Emergency Medicine

## 2024-03-18 DIAGNOSIS — F22 Delusional disorders: Secondary | ICD-10-CM | POA: Insufficient documentation

## 2024-03-18 DIAGNOSIS — R4189 Other symptoms and signs involving cognitive functions and awareness: Secondary | ICD-10-CM

## 2024-03-18 DIAGNOSIS — F1910 Other psychoactive substance abuse, uncomplicated: Secondary | ICD-10-CM | POA: Insufficient documentation

## 2024-03-18 DIAGNOSIS — R4589 Other symptoms and signs involving emotional state: Secondary | ICD-10-CM

## 2024-03-18 DIAGNOSIS — R45851 Suicidal ideations: Secondary | ICD-10-CM | POA: Insufficient documentation

## 2024-03-18 DIAGNOSIS — F17210 Nicotine dependence, cigarettes, uncomplicated: Secondary | ICD-10-CM | POA: Diagnosis not present

## 2024-03-18 DIAGNOSIS — F201 Disorganized schizophrenia: Secondary | ICD-10-CM | POA: Diagnosis not present

## 2024-03-18 DIAGNOSIS — Z8659 Personal history of other mental and behavioral disorders: Secondary | ICD-10-CM

## 2024-03-18 DIAGNOSIS — F319 Bipolar disorder, unspecified: Secondary | ICD-10-CM | POA: Diagnosis present

## 2024-03-18 LAB — COMPREHENSIVE METABOLIC PANEL WITH GFR
ALT: 25 U/L (ref 0–44)
AST: 28 U/L (ref 15–41)
Albumin: 4.2 g/dL (ref 3.5–5.0)
Alkaline Phosphatase: 49 U/L (ref 38–126)
Anion gap: 9 (ref 5–15)
BUN: 9 mg/dL (ref 6–20)
CO2: 27 mmol/L (ref 22–32)
Calcium: 9.4 mg/dL (ref 8.9–10.3)
Chloride: 102 mmol/L (ref 98–111)
Creatinine, Ser: 1.05 mg/dL (ref 0.61–1.24)
GFR, Estimated: >60 mL/min (ref >60)
Glucose, Bld: 80 mg/dL (ref 70–99)
Potassium: 3.5 mmol/L (ref 3.5–5.1)
Sodium: 138 mmol/L (ref 135–145)
Total Bilirubin: 0.6 mg/dL (ref 0.0–1.2)
Total Protein: 7.7 g/dL (ref 6.5–8.1)

## 2024-03-18 LAB — CBC
HCT: 42.9 % (ref 39.0–52.0)
Hemoglobin: 14.3 g/dL (ref 13.0–17.0)
MCH: 31.6 pg (ref 26.0–34.0)
MCHC: 33.3 g/dL (ref 30.0–36.0)
MCV: 94.7 fL (ref 80.0–100.0)
Platelets: 209 K/uL (ref 150–400)
RBC: 4.53 MIL/uL (ref 4.22–5.81)
RDW: 12.8 % (ref 11.5–15.5)
WBC: 3.7 K/uL — ABNORMAL LOW (ref 4.0–10.5)
nRBC: 0 % (ref 0.0–0.2)

## 2024-03-18 LAB — ETHANOL: Alcohol, Ethyl (B): <15 mg/dL (ref <15)

## 2024-03-18 MED ORDER — ALUM & MAG HYDROXIDE-SIMETH 200-200-20 MG/5ML PO SUSP: 30.0000 mL | Freq: Four times a day (QID) | ORAL | Status: DC | PRN

## 2024-03-18 MED ORDER — ONDANSETRON HCL 4 MG PO TABS: 4.0000 mg | ORAL_TABLET | Freq: Three times a day (TID) | ORAL | Status: DC | PRN

## 2024-03-18 MED ORDER — LORAZEPAM 1 MG PO TABS
1.0000 mg | ORAL_TABLET | Freq: Once | ORAL | Status: AC
  Administered 2024-03-18: 1 mg via ORAL
  Filled 2024-03-18: qty 1

## 2024-03-18 MED ORDER — OLANZAPINE 5 MG PO TBDP: 5.0000 mg | ORAL_TABLET | Freq: Every day | ORAL | 1 refills | Status: DC

## 2024-03-18 MED ORDER — IBUPROFEN 600 MG PO TABS: 600.0000 mg | ORAL_TABLET | Freq: Three times a day (TID) | ORAL | Status: DC | PRN

## 2024-03-18 MED ORDER — QUETIAPINE FUMARATE 200 MG PO TABS
200.0000 mg | ORAL_TABLET | Freq: Once | ORAL | Status: AC
  Administered 2024-03-18: 200 mg via ORAL
  Filled 2024-03-18: qty 1

## 2024-03-18 MED ORDER — FLUOXETINE HCL 20 MG PO CAPS
20.0000 mg | ORAL_CAPSULE | Freq: Every day | ORAL | 1 refills | Status: AC
Start: 2024-03-18 — End: 2025-03-18

## 2024-03-18 MED ORDER — QUETIAPINE FUMARATE 200 MG PO TABS: 200.0000 mg | ORAL_TABLET | Freq: Every day | ORAL | Status: DC

## 2024-03-18 MED ORDER — OLANZAPINE 5 MG PO TBDP: 5.0000 mg | ORAL_TABLET | Freq: Two times a day (BID) | ORAL | Status: DC

## 2024-03-18 NOTE — ED Notes (Signed)
 Pt to nursing station asking for something to eat. Per Dr. Claudene, pt may eat/drink. Provided with [place] sandwich tray.

## 2024-03-18 NOTE — ED Notes (Signed)
 Pt came to nurses station asking excuse me, could you order a stool sample for me please?. Pt is ambulatory and in no acute distress. Pt also came back to nurses station and asking excuse me, could you also order an xray of my neck? Told pt will inform EDP of his concern.
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
## 2024-03-18 NOTE — ED Notes (Signed)
 Pt walked out of department prior to discharge VS or AVS. No S/S of distress noted.
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
## 2024-03-18 NOTE — ED Notes (Signed)
 Pt given snack at this time. Pt calm and cooperative.

## 2024-03-18 NOTE — ED Notes (Addendum)
 Pt asking to have his belongings locked up. Security called but no one picked up. Pt stated he was walking to the front to see if anybody could help him there and then proceeded to first nurses desk. Pt walked back to bed in hallway and then stated he did not need his belongings locked up and would keep them at his bedside.

## 2024-03-18 NOTE — ED Notes (Signed)
 Patient again demanding pain medication and x-rays.  Informed patient that this RN has spoken to the MD about his concerns and that MD had decided that patient does not need and IV or narcotic pain medications at this time.

## 2024-03-18 NOTE — ED Notes (Signed)
 Patient placed his own belongings into patient valuables envelope and is requesting staff sign off on the envelope and lock them away. Explained to patient that there is no reason for us  to take his belongings at this time, and he needs to remain in possession of his own belongings. Patient states, But I can't take my stuff with me downstairs, right? Explained to patient that he is not being sent anywhere at this time, because psych provider will come to evaluate him here. No evidence of learning.

## 2024-03-18 NOTE — ED Notes (Signed)
 Requesting urine sample per psych provider request. Pt refuses. Informed patient that decisions about admission cannot be made without a UDS. Pt continues to refuse, stating, that's a lie. I got admitted last time without one. But I got a stool sample for you. You can check that. Tracie NP made aware of patient's response.

## 2024-03-18 NOTE — ED Notes (Signed)
 Patient continues to demand narcotic pain medications and to be placed downstairs.

## 2024-03-18 NOTE — ED Notes (Signed)
 Patient demanding IV morphine for pain.  This RN attempted to explain to patient that we don't start with IV narcotic pain medications usually attempt something by mouth that is non-narcotic first.  Patient very demanding and talking over this RN and unable to be redirected.  Patient stating he has not been evaluated by MD yet.  Reassured patient he had been evaluated by MD but that this RN would request MD to come and see him again.

## 2024-03-18 NOTE — ED Notes (Signed)
 Pt up to nurses station again asking for sandwich and stool sample and when he will be moved.

## 2024-03-18 NOTE — ED Notes (Signed)
 Pt in hallway asking security officer if we can lock his stuff up.

## 2024-03-18 NOTE — ED Notes (Signed)
 Pt to nursing station again requesting cup to provide stool sample. Informed pt again that a stool sample is not necessary. Pt approaching different people stating that he needs to give a stool sample. Redirected patient back to his bed.

## 2024-03-18 NOTE — ED Provider Notes (Signed)
 [facility] Provider Note    Event Date/Time   First MD Initiated Contact with Patient 03/18/24 1559     (approximate)   History   Chief Complaint: Suicidal   HPI  Dustin Bennett is a 38 y.o. male with a history of bipolar disorder, polysubstance abuse who comes back to the ED immediately after recent discharge, complaining of suicidal thoughts.  He was evaluated earlier today, had psych consult which recommended 24-hour observation.  Plan/intent to harm himself.  No HI or hallucinations.     Physical Exam   Triage Vital Signs: ED Triage Vitals  Encounter Vitals Group     BP 03/18/24 1533 (!) 142/97     Girls Systolic BP Percentile --      Girls Diastolic BP Percentile --      Boys Systolic BP Percentile --      Boys Diastolic BP Percentile --      Pulse Rate 03/18/24 1533 (!) 110     Resp 03/18/24 1533 18     Temp 03/18/24 1533 98 F (36.7 C)     Temp Source 03/18/24 1533 Oral     SpO2 03/18/24 1533 98 %     Weight 03/18/24 1540 209 lb 7 oz (95 kg)     Height 03/18/24 1540 5' 11 (1.803 m)     Head Circumference --      Peak Flow --      Pain Score 03/18/24 1536 0     Pain Loc --      Pain Education --      Exclude from Growth Chart --     Most recent vital signs: Vitals:   03/18/24 1533  BP: (!) 142/97  Pulse: (!) 110  Resp: 18  Temp: 98 F (36.7 C)  SpO2: 98%    General: Awake, no distress. CV:  Good peripheral perfusion.  Resp:  Normal effort.  Abd:  No distention.  Other:  No wounds   ED Results / Procedures / Treatments   Labs (all labs ordered are listed, but only abnormal results are displayed) Labs Reviewed  CBC - Abnormal; Notable for the following components:      Result Value   WBC 3.7 (*)    All other components within normal limits  COMPREHENSIVE METABOLIC PANEL WITH GFR  ETHANOL  URINE DRUG SCREEN, QUALITATIVE ([facility] ONLY)     EKG    RADIOLOGY    PROCEDURES:  Procedures   MEDICATIONS  ORDERED IN ED: Medications  ibuprofen  (ADVIL ) tablet 600 mg (has no administration in time range)  ondansetron  (ZOFRAN ) tablet 4 mg (has no administration in time range)  alum & mag hydroxide-simeth (MAALOX/MYLANTA) 200-200-20 MG/5ML suspension 30 mL (has no administration in time range)     IMPRESSION / MDM / ASSESSMENT AND PLAN / ED COURSE  I reviewed the triage vital signs and the nursing notes.  Patient's presentation is most consistent with acute presentation with potential threat to life or bodily function.  Patient returns to the ED after leaving earlier today.  He earlier had a psychiatry evaluation, consult note reviewed which recommends 24-hour observation and reassessment tomorrow.  He is medically stable.  The patient has been placed in psychiatric observation due to the need to provide a safe environment for the patient while obtaining psychiatric consultation and evaluation, as well as ongoing medical and medication management to treat the patient's condition.  The patient has not been placed under full IVC at this time.  FINAL CLINICAL IMPRESSION(S) / ED DIAGNOSES   Final diagnoses:  Polysubstance abuse (HCC)     Rx / DC Orders   ED Discharge Orders     None        Note:  This document was prepared using Dragon voice recognition software and may include unintentional dictation errors.   Viviann Pastor, MD 03/18/24 [phone]

## 2024-03-18 NOTE — ED Notes (Signed)
 Pt currently speaking on phone.

## 2024-03-18 NOTE — ED Notes (Signed)
 Pt to nursing station again asking for scrubs to change into so he can lock up all my stuff. Explained that he does not need to change right now. Redirected patient back to his bed.

## 2024-03-18 NOTE — ED Notes (Signed)
 Dr. Viviann and this RN in with patient to address his concerns.  Dr Viviann re-enforced that IV fluids and medications were only given when needed and when the patient could not take in meds or fluids by mouth.  Also re-enforced that we don't usually start with narcotic pain medications.
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
## 2024-03-18 NOTE — ED Notes (Signed)
 Dinner tray provided to pt
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
## 2024-03-18 NOTE — BH Assessment (Signed)
 Substance  Referral information for Psychiatric Hospitalization faxed to:   [facility] [phone] or [phone])  . [facility] [phone])  . [facility] [phone])  . [facility] [phone])  . [facility] [phone] -or- [phone]),

## 2024-03-18 NOTE — ED Triage Notes (Signed)
 Pt was seen here earlier today. Discharged and immediately checked back in for Suicidal ideation. Pt reports that he just didn't tell anyone before, but states that he has been feeling this way x2 weeks. Pt does have SS card in hand, and will not let staff take it. This RN had security in the room. Explained that his documents would be sealed and locked up in a secure space. Pt still refusing. Charge RN aware.   Pt's belongings that were bagged up include: blue pants, shirt, and red shoes.

## 2024-03-18 NOTE — BH Assessment (Signed)
 Patient was seen by Psych Team earlier today. Patient was faxed out for inpatient admission.

## 2024-03-18 NOTE — ED Notes (Signed)
 Showed patient where he gave urine sample prior to admission on 6/29. Continues to refuse to provide urine sample. Pt states, I know for a fact I didn't give that. That is someone else's. I could be a runaway sex trafficking person and they wouldn't know.

## 2024-03-19 NOTE — ED Provider Notes (Signed)
 Notified by RN that patient was requesting to be discharged.  This is a 38 year old male seen twice in the past 2 days with disorganized thoughts and suicidal ideation without specific plan.  He is not under IVC.  When care of this patient was assumed from prior physician, notified that patient was faxed out for inpatient placement, but remained voluntary and was okay to leave if he wished to do so.  I went to evaluate the patient at bedside.  He is awake, cooperative.  He tells me he has had time to think and would prefer to be discharged and follow-up as an outpatient with [facility].  He denies SI, HI, AVH.  I do not believe he meets IVC criteria at this point.  He was discharged in stable condition.   Levander Slate, MD 03/19/24 [phone]

## 2024-03-19 NOTE — ED Notes (Signed)
 Dr Levander in with patient.

## 2024-03-19 NOTE — ED Notes (Signed)
 Patient left prior to receiving discharge instructions and refused vital signs.

## 2024-03-19 NOTE — ED Notes (Signed)
 Patient's belongings returned to him. And patient getting dressed to leave.

## 2024-03-19 NOTE — Discharge Instructions (Signed)
 I have included some outpatient resources regarding counseling and substance use.  Return to the ER for new or worsening symptoms.

## 2024-03-19 NOTE — ED Notes (Signed)
 Patient stating he wants to leave.  Patient encouraged to stay but refused.  MD notified.

## 2024-04-05 NOTE — ED Provider Notes (Signed)
 Emergency Department Provider Note  Dragon voice dictation used for charting.    Provider at bedside: 6:01 AM  History obtained from the: Patient  History   Chief Complaint  Patient presents with  . Cough     HPI  Dustin Bennett is a 38 y.o. male who presents to the ED with complaints of flulike symptoms.  Patient endorses 2 days of dry cough, headache, nasal congestion, sore throat, body aches, nausea with vomiting, and chest pain with cough.  He denies shortness of breath or fevers.  He has been taking Tylenol  with little to no relief of his symptoms.  Patient is not sure if any of his close contacts have had similar symptoms.    No LMP for male patient.   Past Medical History Medical History[1]  Past Surgical History Surgical History[2]    Allergies Allergies[3]   Family History Family History[4]   Social History Social History[5]    Physical Exam   Vitals:   04/05/24 0451 04/05/24 0540 04/05/24 0600  BP: (!) 153/91  (!) 145/104  BP Location: Right arm    Patient Position: Sitting    Pulse: 106 93   Resp: 20    Temp: 97.7 F (36.5 C)    TempSrc: Oral    SpO2: 98% 100%   Weight: 82.8 kg (182 lb 9.6 oz)    Height: 175.3 cm (5' 9)      Physical Exam Vitals and nursing note reviewed.  Constitutional:      General: He is not in acute distress.    Appearance: He is well-developed. He is not toxic-appearing.  HENT:     Head: Normocephalic and atraumatic.     Right Ear: External ear normal.     Left Ear: External ear normal.     Nose: Nose normal.     Mouth/Throat:     Mouth: Mucous membranes are moist.     Pharynx: Posterior oropharyngeal erythema present.  Eyes:     General: Lids are normal.     Extraocular Movements: Extraocular movements intact.     Conjunctiva/sclera: Conjunctivae normal.     Pupils: Pupils are equal, round, and reactive to light.     Comments: Left conjunctival injection, no discharge  Cardiovascular:     Rate and  Rhythm: Regular rhythm. Tachycardia present.     Pulses: Normal pulses.     Heart sounds: Normal heart sounds.  Pulmonary:     Effort: Pulmonary effort is normal.     Breath sounds: Normal breath sounds.  Abdominal:     General: Abdomen is flat. Bowel sounds are normal. There is no distension.     Palpations: Abdomen is soft. There is no mass.     Tenderness: There is no abdominal tenderness. There is no guarding.  Musculoskeletal:     Cervical back: Normal range of motion and neck supple.  Skin:    General: Skin is warm and dry.     Capillary Refill: Capillary refill takes less than 2 seconds.  Neurological:     General: No focal deficit present.     Mental Status: He is alert and oriented to person, place, and time.  Psychiatric:        Mood and Affect: Mood normal.        Behavior: Behavior normal.     Labs   Lab Results (last 24 hours)     Procedure Component Value Ref Range Date/Time   SARS-Cov-2, Flu, and RSV, Qualitative NAAT [[id number]]  (Normal)  Collected: 04/05/24 0454   Lab Status: Final result Specimen: Swab from Nasopharynx Updated: 04/05/24 0536    SARS-CoV-2 Negative Negative     Influenza A Negative Negative     Influenza B Negative Negative     RSV Negative Negative    Narrative:     Results are for use in the simultaneous rapid in vitro detection and differentiation of SARS-CoV-2, RSV, influenza A virus, and influenza B virus nucleic acids by PCR in clinical specimens.   Positive results are indicative of active infection but do not rule out bacterial infection or co-infection with other pathogens not detected by the test. Clinical correlation with patient history and other diagnostic information is necessary to determine patient infection status. The agent detected may not be the definite cause of disease.   Negative results do not preclude SARS-CoV-2, RSV, influenza A, and/or influenza B infection and should not be used as the sole basis for diagnosis,  treatment or other patient management decisions. Negative results must be combined with clinical observations, patient history, and/or epidemiological information.   Test performed by [facility] qualified personnel using the Cepheid Xpert SARS-CoV-2 & Influenza A/B Nucleic acid test on the GeneXpert instrument. This test is only for use under the Food and Drug Administration's Emergency Use Authorization (EUA).         Radiology   Radiology Results (last 72 hours)     Procedure Component Value Units Date/Time   XR Chest 2 Views - Preliminary [[id number]] Collected: 04/05/24 0559   Order Status: Completed Updated: 04/05/24 0559   This result has not been signed. Information might be incomplete.     Narrative:     XR CHEST 2 VIEWS, 04/05/2024 5:14 AM  INDICATION: cough  COMPARISON: None.  FINDINGS:   Cardiovascular: Cardiac silhouette and pulmonary vasculature are within normal limits. Mediastinum: Within normal limits. Lungs/pleura: Clear. No focal airspace opacity, pleural effusion, or pneumothorax. Upper abdomen: Visualized portions are unremarkable.  Chest wall/osseous structures: No acute displaced fracture. Mild polyarticular degenerative changes.     Impression:     There is no evidence of acute cardiac or pulmonary abnormality.         EKG     No results found for this visit on 04/05/24.   ED Course   ED Course as of 04/05/24 0620  Thu Apr 05, 2024  0607 DRY COUGH 2 DAYS AGO.  HA, NASAL CONGESTION, SORE THROAT, BODY ACHES, CP WITH COUGH, N/V,  -FEVER TYLENOL .  NOT SURE ABOUT SICK CONTACTS [AS]    ED Course User Index [AS] Peyton PARAS Schlagheck, PA-C    Procedure Note   Procedures  Medical Decision Making   Clinical Complexity  Patient's presentation is most consistent with acute illness / injury with systematic symptoms.    Provider time spent in patient care today, inclusive of but not limited to clinical reassessment, review of diagnostic  studies, and discharge preparation, was greater than 30 minutes.    Medical Decision Making Problems Addressed: Viral infection: complicated acute illness or injury  Amount and/or Complexity of Data Reviewed Labs: ordered. Decision-making details documented in ED Course. Radiology: ordered and independent interpretation performed. Decision-making details documented in ED Course.  Risk OTC drugs. Prescription drug management.     Differential diagnosis includes but is not limited to DDX: pneumonia, flu, viral infection, pharyngitis, bronchitis, URI, UTI, gastroenteritis, dehydration,    ED Clinical Impression   1. Viral infection      ED Assessment/Plan  Patient presented to the  ED with a chief complaint of flulike symptoms.  Upon arrival to patient, he was resting on the hospital bed in no acute distress.  Physical exam was markable for the above findings.  COVID, flu, and RSV tests were negative.  Chest x-ray showed no active cardiopulmonary disease.  Per my viewing, I did not note any acute findings.  In the ED, Zofran  was ordered for the patient.  Prior to her even receiving the Zofran , he was able to tolerate p.o. intake before he even received the Zofran , he was able to tolerate p.o. intake of crackers and ginger ale.  Overall, patient symptoms seem consistent with a viral illness.  Given that he does not appear dehydrated, is able to tolerate p.o. intake, and is hemodynamically stable, I feel he can be safely discharged.  Strict return precautions were discussed and patient was given the rest of his discharge directions.  He stated that he understood and had no further questions.  He was discharged in stable condition with a prescription for Zofran . ED Meds Given During Visit Medications  ondansetron  (ZOFRAN -ODT) disintegrating tablet 4 mg (has no administration in time range)      Current Discharge Medication List     START taking these medications   Details  ondansetron   (ZOFRAN -ODT) 4 mg disintegrating tablet Dissolve 1 tablet (4 mg total) on tongue every 8 (eight) hours as needed for nausea or vomiting. Qty: 15 tablet, Refills: 0          FOLLOW UP [facility] -  EMERGENCY DEPARTMENT [address] [place]  [address] [phone]    [facility] [place] [address] [place]  [phone]  As needed to establish a PCP   Electronically signed by: 6:01 AM 04/05/2024 for Allyson Schlagheck PA-C        [1] No past medical history on file. [2] No past surgical history on file. [3] No Known Allergies [4] No family history on file. [5]

## 2024-04-26 ENCOUNTER — Ambulatory Visit: Payer: Self-pay

## 2024-04-27 ENCOUNTER — Emergency Department (HOSPITAL_COMMUNITY): Payer: MEDICAID

## 2024-04-27 ENCOUNTER — Encounter (HOSPITAL_COMMUNITY): Payer: Self-pay | Admitting: *Deleted

## 2024-04-27 ENCOUNTER — Encounter (HOSPITAL_COMMUNITY): Payer: Self-pay | Admitting: Emergency Medicine

## 2024-04-27 ENCOUNTER — Other Ambulatory Visit: Payer: Self-pay

## 2024-04-27 ENCOUNTER — Emergency Department (HOSPITAL_COMMUNITY)
Admission: EM | Admit: 2024-04-27 | Discharge: 2024-04-28 | Disposition: A | Discharge status: Home / Self Care | Payer: MEDICAID | Attending: Emergency Medicine | Admitting: Emergency Medicine

## 2024-04-27 ENCOUNTER — Ambulatory Visit (HOSPITAL_COMMUNITY)
Admission: EM | Admit: 2024-04-27 | Discharge: 2024-04-27 | Disposition: A | Discharge status: Home / Self Care | Payer: Self-pay

## 2024-04-27 DIAGNOSIS — J069 Acute upper respiratory infection, unspecified: Secondary | ICD-10-CM

## 2024-04-27 DIAGNOSIS — F29 Unspecified psychosis not due to a substance or known physiological condition: Secondary | ICD-10-CM | POA: Insufficient documentation

## 2024-04-27 DIAGNOSIS — R059 Cough, unspecified: Secondary | ICD-10-CM | POA: Insufficient documentation

## 2024-04-27 DIAGNOSIS — F309 Manic episode, unspecified: Secondary | ICD-10-CM

## 2024-04-27 DIAGNOSIS — E876 Hypokalemia: Secondary | ICD-10-CM | POA: Insufficient documentation

## 2024-04-27 DIAGNOSIS — R0981 Nasal congestion: Secondary | ICD-10-CM | POA: Diagnosis not present

## 2024-04-27 DIAGNOSIS — R000 Tachycardia, unspecified: Secondary | ICD-10-CM

## 2024-04-27 DIAGNOSIS — R053 Chronic cough: Secondary | ICD-10-CM

## 2024-04-27 LAB — COMPREHENSIVE METABOLIC PANEL WITH GFR
ALT: 37 U/L (ref 0–44)
AST: 67 U/L — ABNORMAL HIGH (ref 15–41)
Albumin: 4.2 g/dL (ref 3.5–5.0)
Alkaline Phosphatase: 51 U/L (ref 38–126)
Anion gap: 14 (ref 5–15)
BUN: 13 mg/dL (ref 6–20)
CO2: 22 mmol/L (ref 22–32)
Calcium: 9.4 mg/dL (ref 8.9–10.3)
Chloride: 100 mmol/L (ref 98–111)
Creatinine, Ser: 1.35 mg/dL — ABNORMAL HIGH (ref 0.61–1.24)
GFR, Estimated: >60 mL/min (ref >60)
Glucose, Bld: 127 mg/dL — ABNORMAL HIGH (ref 70–99)
Potassium: 3.3 mmol/L — ABNORMAL LOW (ref 3.5–5.1)
Sodium: 136 mmol/L (ref 135–145)
Total Bilirubin: 1 mg/dL (ref 0.0–1.2)
Total Protein: 7.1 g/dL (ref 6.5–8.1)

## 2024-04-27 LAB — URINALYSIS, ROUTINE W REFLEX MICROSCOPIC
Bacteria, UA: NONE SEEN
Bilirubin Urine: NEGATIVE
Glucose, UA: NEGATIVE mg/dL
Hgb urine dipstick: NEGATIVE
Ketones, ur: NEGATIVE mg/dL
Leukocytes,Ua: NEGATIVE
Nitrite: NEGATIVE
Protein, ur: NEGATIVE mg/dL
Specific Gravity, Urine: 1.03 (ref 1.005–1.030)
pH: 5 (ref 5.0–8.0)

## 2024-04-27 LAB — ETHANOL: Alcohol, Ethyl (B): <15 mg/dL (ref <15)

## 2024-04-27 LAB — CBC WITH DIFFERENTIAL/PLATELET
Abs Immature Granulocytes: 0.02 K/uL (ref 0.00–0.07)
Basophils Absolute: 0 K/uL (ref 0.0–0.1)
Basophils Relative: 0 %
Eosinophils Absolute: 0 K/uL (ref 0.0–0.5)
Eosinophils Relative: 0 %
HCT: 42.7 % (ref 39.0–52.0)
Hemoglobin: 14.5 g/dL (ref 13.0–17.0)
Immature Granulocytes: 0 %
Lymphocytes Relative: 21 %
Lymphs Abs: 1.6 K/uL (ref 0.7–4.0)
MCH: 31.1 pg (ref 26.0–34.0)
MCHC: 34 g/dL (ref 30.0–36.0)
MCV: 91.6 fL (ref 80.0–100.0)
Monocytes Absolute: 0.7 K/uL (ref 0.1–1.0)
Monocytes Relative: 10 %
Neutro Abs: 5 K/uL (ref 1.7–7.7)
Neutrophils Relative %: 69 %
Platelets: 197 K/uL (ref 150–400)
RBC: 4.66 MIL/uL (ref 4.22–5.81)
RDW: 13 % (ref 11.5–15.5)
WBC: 7.4 K/uL (ref 4.0–10.5)
nRBC: 0 % (ref 0.0–0.2)

## 2024-04-27 MED ORDER — DOXYCYCLINE HYCLATE 100 MG PO TABS
100.0000 mg | ORAL_TABLET | Freq: Once | ORAL | Status: AC
  Administered 2024-04-27: 100 mg via ORAL
  Filled 2024-04-27: qty 1

## 2024-04-27 MED ORDER — CEFTRIAXONE SODIUM 500 MG IJ SOLR
500.0000 mg | Freq: Once | INTRAMUSCULAR | Status: AC
  Administered 2024-04-27: 500 mg via INTRAMUSCULAR
  Filled 2024-04-27: qty 500

## 2024-04-27 MED ORDER — LIDOCAINE HCL (PF) 1 % IJ SOLN
INTRAMUSCULAR | Status: AC
  Filled 2024-04-27: qty 5

## 2024-04-27 NOTE — ED Provider Notes (Signed)
 [facility]    CSN: [id number] Arrival date & time: 04/27/24  1801      History   Chief Complaint Chief Complaint  Patient presents with   Shortness of Breath   ADHD    weakn   Weakness   Back Pain    HPI Dustin Bennett is a 38 y.o. male.   Pt is a history of bipolar disorder, depression, and polysubstance abuse presents today due to not feeling well for some time.  Patient states that he is experiencing 10 out of 10 pain and just feels that something is not right.  Patient denies use of antipsychotic medications for about a week.  Patient denies homicidal or suicidal ideations.  Patient was recently seen in the ED for viral URI and given Tessalon for symptoms but states that he is not experiencing any relief.  Patient appears agitated and jittery during interview, ripping up stickers and taping them around his paperwork, scribbling on paper, and putting stickers on face.  Patient appears to be in a manic state.  Patient admits to concerns about his mental health and agrees to be evaluated in the ER but he would like to be taken via [facility].  The history is provided by the patient.  Shortness of Breath Weakness Associated symptoms: shortness of breath   Back Pain Associated symptoms: weakness     Past Medical History:  Diagnosis Date   Bipolar 1 disorder (HCC)    Depression    HIV (human immunodeficiency virus infection) (HCC)     Patient Active Problem List   Diagnosis Date Noted   Substance induced mood disorder (HCC) 01/22/2024   Bipolar disorder (HCC) 01/22/2024   Depression 01/22/2024   Amphetamine use 01/22/2024   Cocaine use 01/22/2024   Bipolar disorder (HCC) 12/28/2023   MDD (major depressive disorder) 12/26/2023   Pneumonia 01/16/2020   Syphilis 08/22/2019   MDD (major depressive disorder), recurrent severe, 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 Authorizing Provider  OLANZapine  zydis (ZYPREXA ) 5 MG disintegrating tablet Take 1 tablet (5 mg total) by mouth 2 (two) times daily. 12/30/23  Yes Debbie Donnice BRAVO, PA-C  QUEtiapine  (SEROQUEL ) 200 MG tablet Take 200 mg by mouth at bedtime. 11/17/23  Yes [provider]  atorvastatin (LIPITOR) 20 MG tablet Take 20 mg by mouth daily. Patient not taking: Reported on 03/18/2024 12/07/23   [provider]  FLUoxetine  (PROZAC ) 20 MG capsule Take 1 capsule (20 mg total) by mouth daily. Patient not taking: Reported on 03/18/2024 03/18/24 03/18/25  Claudene Rover, MD  gabapentin  (NEURONTIN ) 100 MG capsule Take 2 capsules (200 mg total) by mouth 2 (two) times daily. Patient not taking: Reported on 03/18/2024 01/24/24   Jadapalle, Sree, MD  nicotine  (NICODERM CQ  - DOSED IN MG/24 HOURS) 14 mg/24hr patch Place 1 patch (14 mg total) onto the skin daily. Patient not taking: Reported on 03/18/2024 01/25/24   Jadapalle, Sree, MD  sertraline  (ZOLOFT )  50 MG tablet Take 1 tablet (50 mg total) by mouth daily. Patient not taking: Reported on 03/18/2024 01/25/24   Donnelly Mellow, MD    Family History History reviewed. No pertinent family history.  Social History Social History   Tobacco Use   Smoking status: Every Day    Current packs/day: 1.00    Types: Cigarettes, Cigars   Smokeless tobacco: Never   Tobacco comments:    Smoke 2-4 cigs per day  Vaping Use   Vaping status: Never Used  Substance Use Topics   Alcohol use: Yes    Comment: 2-4 monthly   Drug  use: Yes    Types: Marijuana, Cocaine, Methamphetamines     Allergies   Patient has no known allergies.   Review of Systems Review of Systems  Respiratory:  Positive for shortness of breath.   Musculoskeletal:  Positive for back pain.  Neurological:  Positive for weakness.     Physical Exam Triage Vital Signs ED Triage Vitals  Encounter Vitals Group     BP 04/27/24 1823 (!) 142/66     Girls Systolic BP Percentile --      Girls Diastolic BP Percentile --      Boys Systolic BP Percentile --      Boys Diastolic BP Percentile --      Pulse Rate 04/27/24 1823 (!) 122     Resp 04/27/24 1823 18     Temp 04/27/24 1823 98.1 F (36.7 C)     Temp Source 04/27/24 1823 Oral     SpO2 04/27/24 1823 93 %     Weight --      Height --      Head Circumference --      Peak Flow --      Pain Score 04/27/24 1820 10     Pain Loc --      Pain Education --      Exclude from Growth Chart --    No data found.  Updated Vital Signs BP (!) 142/66 (BP Location: Left Arm)   Pulse (!) 122   Temp 98.1 F (36.7 C) (Oral)   Resp 18   SpO2 93%   Visual Acuity Right Eye Distance:   Left Eye Distance:   Bilateral Distance:    Right Eye Near:   Left Eye Near:    Bilateral Near:     Physical Exam Vitals and nursing note reviewed.  Constitutional:      General: He is not in acute distress.    Appearance: He is well-developed. He is not ill-appearing, toxic-appearing or diaphoretic.  Eyes:     General: No scleral icterus. Cardiovascular:     Rate and Rhythm: Normal rate and regular rhythm.     Heart sounds: Normal heart sounds.  Pulmonary:     Effort: Pulmonary effort is normal. No respiratory distress.     Breath sounds: Normal breath sounds. No wheezing or rhonchi.  Skin:    General: Skin is warm.  Neurological:     Mental Status: He is alert and oriented to person, place, and time.  Psychiatric:        Mood and Affect: Mood is anxious.        Speech: Speech normal.         Behavior: Behavior is agitated, withdrawn and hyperactive.      UC Treatments / Results  Labs (all labs ordered are listed, but only abnormal results are displayed) Labs Reviewed - No data to display  EKG  Radiology No results found.  Procedures Procedures (including critical care time)  Medications Ordered in UC Medications - No data to display  Initial Impression / Assessment and Plan / UC Course  I have reviewed the triage vital signs and the nursing notes.  Pertinent labs & imaging results that were available during my care of the patient were reviewed by me and considered in my medical decision making (see chart for details).     Mania/persistent cough/tachycardia-offered treatment in the UC but patient states that he would like to be sent to the ED for further evaluation of the mental status and physical status.  [facility] was called to escort patient to ED. Final Clinical Impressions(s) / UC Diagnoses   Final diagnoses:  None   Discharge Instructions   None    ED Prescriptions   None    PDMP not reviewed this encounter.   Andra Corean BROCKS, PA-C 04/27/24 1853

## 2024-04-27 NOTE — ED Provider Notes (Addendum)
  EMERGENCY DEPARTMENT AT [facility] Provider Note   CSN: [id number] Arrival date & time: 04/27/24  8047     Patient presents with: Influenza   Dustin Bennett is a 38 y.o. male.   Presents to the emergency department from urgent care.  Patient presented to urgent care with URI symptoms but was found to be disorganized and psychotic.  Transferred here for psychiatric evaluation.       Prior to Admission medications   Medication Sig Start Date End Date Taking? Authorizing Provider  atorvastatin (LIPITOR) 20 MG tablet Take 20 mg by mouth daily. Patient not taking: Reported on 03/18/2024 12/07/23   [provider]  FLUoxetine  (PROZAC ) 20 MG capsule Take 1 capsule (20 mg total) by mouth daily. Patient not taking: Reported on 03/18/2024 03/18/24 03/18/25  Claudene Rover, MD  gabapentin  (NEURONTIN ) 100 MG capsule Take 2 capsules (200 mg total) by mouth 2 (two) times daily. Patient not taking: Reported on 03/18/2024 01/24/24   Jadapalle, Sree, MD  nicotine  (NICODERM CQ  - DOSED IN MG/24 HOURS) 14 mg/24hr patch Place 1 patch (14 mg total) onto the skin daily. Patient not taking: Reported on 03/18/2024 01/25/24   Jadapalle, Sree, MD  OLANZapine  zydis (ZYPREXA ) 5 MG disintegrating tablet Take 1 tablet (5 mg total) by mouth 2 (two) times daily. 12/30/23   Debbie Donnice BRAVO, PA-C  QUEtiapine  (SEROQUEL ) 200 MG tablet Take 200 mg by mouth at bedtime. 11/17/23   [provider]  sertraline  (ZOLOFT ) 50 MG tablet Take 1 tablet (50 mg total) by mouth daily. Patient not taking: Reported on 03/18/2024 01/25/24   Jadapalle, Sree, MD    Allergies: Patient has no known allergies.    Review of Systems  Updated Vital Signs BP 124/77   Pulse (!) 107   Temp 99.8 F (37.7 C) (Oral)   Resp 20   SpO2 94%   Physical Exam Vitals and nursing note reviewed.  Constitutional:      General: He is not in acute distress.    Appearance: He is well-developed.  HENT:     Head:  Normocephalic and atraumatic.     Mouth/Throat:     Mouth: Mucous membranes are moist.  Eyes:     General: Vision grossly intact. Gaze aligned appropriately.     Extraocular Movements: Extraocular movements intact.     Conjunctiva/sclera: Conjunctivae normal.  Cardiovascular:     Rate and Rhythm: Normal rate and regular rhythm.     Pulses: Normal pulses.     Heart sounds: Normal heart sounds, S1 normal and S2 normal. No murmur heard.    No friction rub. No gallop.  Pulmonary:     Effort: Pulmonary effort is normal. No respiratory distress.     Breath sounds: Normal breath sounds.  Abdominal:     Palpations: Abdomen is soft.     Tenderness: There is no abdominal tenderness. There is no guarding or rebound.     Hernia: No hernia is present.  Musculoskeletal:        General: No swelling.     Cervical back: Full passive range of motion without pain, normal range of motion and neck supple. No pain with movement, spinous process tenderness or muscular tenderness. Normal range of motion.     Right lower leg: No edema.     Left lower leg: No edema.  Skin:    General: Skin is warm and dry.     Capillary Refill: Capillary refill takes less than 2 seconds.  Findings: No ecchymosis, erythema, lesion or wound.  Neurological:     Mental Status: He is alert.     GCS: GCS eye subscore is 4. GCS verbal subscore is 5. GCS motor subscore is 6.     Cranial Nerves: Cranial nerves 2-12 are intact.     Sensory: Sensation is intact.     Motor: Motor function is intact. No weakness or abnormal muscle tone.     Coordination: Coordination is intact.  Psychiatric:        Mood and Affect: Mood normal.        Speech: Speech is rapid and pressured and tangential.        Behavior: Behavior normal. Behavior is cooperative.        Thought Content: Thought content is paranoid and delusional.     (all labs ordered are listed, but only abnormal results are displayed) Labs Reviewed  COMPREHENSIVE METABOLIC  PANEL WITH GFR - Abnormal; Notable for the following components:      Result Value   Potassium 3.3 (*)    Glucose, Bld 127 (*)    Creatinine, Ser 1.35 (*)    AST 67 (*)    All other components within normal limits  URINALYSIS, ROUTINE W REFLEX MICROSCOPIC - Abnormal; Notable for the following components:   Color, Urine AMBER (*)    All other components within normal limits  HIV ANTIBODY (ROUTINE TESTING W REFLEX) - Abnormal; Notable for the following components:   HIV Screen 4th Generation wRfx Reactive (*)    All other components within normal limits  RESP PANEL BY RT-PCR (RSV, FLU A&B, COVID)  RVPGX2  CBC WITH DIFFERENTIAL/PLATELET  ETHANOL  HIV-1/2 AB - DIFFERENTIATION  GC/CHLAMYDIA PROBE AMP ([place]) NOT AT [facility]    EKG: None  Radiology: DG Chest 2 View Result Date: 04/27/2024 CLINICAL DATA:  Cough and flu like symptoms. EXAM: CHEST - 2 VIEW COMPARISON:  December 25, 2023 FINDINGS: The heart size and mediastinal contours are within normal limits. Mild diffusely increased lung markings are seen without focal consolidation, pleural effusion or pneumothorax. The visualized skeletal structures are unremarkable. IMPRESSION: No active cardiopulmonary disease. Electronically Signed   By: Suzen Dials M.D.   On: 04/27/2024 23:54     Procedures   Medications Ordered in the ED  cefTRIAXone  (ROCEPHIN ) injection 500 mg (500 mg Intramuscular Given 04/27/24 2104)  doxycycline  (VIBRA -TABS) tablet 100 mg (100 mg Oral Given 04/27/24 2104)  lidocaine  (PF) (XYLOCAINE ) 1 % injection (  Given 04/27/24 2257)                                    Medical Decision Making  Patient transferred from urgent care for psychiatric evaluation.  Patient presented there with cough and congestion.  He had previously been seen for similar symptoms on September 11, had workup including chest x-ray that showed no abnormality.    Repeat workup today is unremarkable.  Blood work is normal.  Chest x-ray without  evidence of pneumonia.    Patient uncooperative with exam.  He seems slightly disorganized but is not homicidal or suicidal.  Patient refusing multiple interventions including talking to psychiatry.  Reviewing his records from outside institutions, this seems to be his baseline.  He does not appear to be a candidate for involuntary commitment at this time and is declining any other interventions.    Final diagnoses:  Psychosis, unspecified psychosis type (HCC)  ED Discharge Orders     None          Kameria Canizares, Lonni PARAS, MD 04/28/24 0159    Haze Lonni PARAS, MD 04/28/24 [phone]

## 2024-04-27 NOTE — ED Notes (Addendum)
 Patient is being discharged from the Urgent Care and sent to the Emergency Department via [facility] . Per Corean Endo, PA-C, patient is in need of higher level of care due to pain, off psy meds X several weeks. Patient is aware and verbalizes understanding of plan of care.  Vitals:   04/27/24 1823  BP: (!) 142/66  Pulse: (!) 122  Resp: 18  Temp: 98.1 F (36.7 C)  SpO2: 93%

## 2024-04-27 NOTE — ED Notes (Signed)
 Food provider to pt as his request and authorization from Dr. Patt.

## 2024-04-27 NOTE — ED Notes (Signed)
 Per Dr. Patt pt is ok to wait at the waiting room for a room. Pt wheeled out by security to the waiting room. Medications given and labs collected as ordered by Dr. Patt, pt refuses to go to have chest x ray.

## 2024-04-27 NOTE — ED Notes (Signed)
 Pt raising voice, speaking to seemingly no one. Pt also disturbing other pts and visitors in lobby by acting erratically.

## 2024-04-27 NOTE — ED Triage Notes (Signed)
 Pt arrive by [facility] from UC for further evaluation for psych problems. Per UC staff pt is not been taking his psych meds for over a week. Pt arrive to UC with flu like symptoms body aches for over a month pt reports.

## 2024-04-27 NOTE — ED Provider Triage Note (Signed)
 Emergency Medicine Provider Triage Evaluation Note  Dustin Bennett , a 38 y.o. male  was evaluated in triage.  Pt complains of cough and concern for possible STD.  Patient has been coughing for more than a week now.  He has subjective fevers.  He is concerned that he has pneumonia.  He is also sexually active and has some dysuria and penile discharge and concern for STD.  Patient went to urgent care and sent here for further evaluation.  Patient was seen by psychiatry multiple times but denies any suicidal or homicidal ideation.  Review of Systems  Positive: Cough and fever Negative: Suicidal ideation  Physical Exam  BP (!) 143/90 (BP Location: Right Arm)   Pulse (!) 118   Temp 99.8 F (37.7 C) (Oral)   Resp 20   SpO2 98%  Gen:   Awake, no distress   Resp:  Diminished bilaterally  MSK:   Moves extremities without difficulty  Other:    Medical Decision Making  Medically screening exam initiated at 8:51 PM.  Appropriate orders placed.  Dustin Bennett was informed that the remainder of the evaluation will be completed by another provider, this initial triage assessment does not replace that evaluation, and the importance of remaining in the ED until their evaluation is complete.  Dustin Bennett is a 38 y.o. male here presenting with cough and concern for STD.  Will check labs and chest x-ray.  Will also obtain GC chlamydia and urinalysis.  Patient specifically asked me to do not order a UDS.  Patient states that he is not suicidal homicidal and does not want to see psychiatry currently.  Patient is stable to wait in the waiting    Patt Alm Macho, MD 04/27/24 2052
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
## 2024-04-27 NOTE — ED Triage Notes (Signed)
 Pt states that he has been SOB, his ADHD has been flaring up, weakness and smelly breath X years but worse the last couple months. He states he just does not feel good.   He states he has back, neck and stomach pain.   He has been off all meds for several weeks.

## 2024-04-27 NOTE — ED Notes (Signed)
 Called [facility] for transport advised provider it will be 30-13min wait for a truck.

## 2024-04-27 NOTE — ED Notes (Signed)
 Called Quarry manager at [facility] ED

## 2024-04-28 ENCOUNTER — Emergency Department (HOSPITAL_COMMUNITY)
Admission: EM | Admit: 2024-04-28 | Discharge: 2024-04-28 | Disposition: A | Discharge status: Home / Self Care | Payer: MEDICAID | Attending: Emergency Medicine | Admitting: Emergency Medicine

## 2024-04-28 DIAGNOSIS — F319 Bipolar disorder, unspecified: Secondary | ICD-10-CM | POA: Diagnosis present

## 2024-04-28 DIAGNOSIS — F1510 Other stimulant abuse, uncomplicated: Secondary | ICD-10-CM | POA: Diagnosis present

## 2024-04-28 DIAGNOSIS — Z21 Asymptomatic human immunodeficiency virus [HIV] infection status: Secondary | ICD-10-CM | POA: Diagnosis not present

## 2024-04-28 DIAGNOSIS — F1994 Other psychoactive substance use, unspecified with psychoactive substance-induced mood disorder: Secondary | ICD-10-CM

## 2024-04-28 DIAGNOSIS — R451 Restlessness and agitation: Secondary | ICD-10-CM | POA: Diagnosis present

## 2024-04-28 LAB — URINE DRUG SCREEN
Amphetamines: POSITIVE — AB
Barbiturates: NEGATIVE
Benzodiazepines: NEGATIVE
Cocaine: NEGATIVE
Fentanyl: NEGATIVE
Methadone Scn, Ur: NEGATIVE
Opiates: NEGATIVE
Tetrahydrocannabinol: NEGATIVE

## 2024-04-28 LAB — HIV ANTIBODY (ROUTINE TESTING W REFLEX): HIV Screen 4th Generation wRfx: REACTIVE — AB

## 2024-04-28 MED ORDER — STERILE WATER FOR INJECTION IJ SOLN
INTRAMUSCULAR | Status: AC
  Administered 2024-04-28: 10 mL
  Filled 2024-04-28: qty 10

## 2024-04-28 MED ORDER — ZIPRASIDONE MESYLATE 20 MG IM SOLR
20.0000 mg | Freq: Once | INTRAMUSCULAR | Status: AC
  Administered 2024-04-28: 20 mg via INTRAMUSCULAR
  Filled 2024-04-28: qty 20

## 2024-04-28 MED ORDER — ALUM & MAG HYDROXIDE-SIMETH 200-200-20 MG/5ML PO SUSP: 30.0000 mL | Freq: Four times a day (QID) | ORAL | Status: DC | PRN

## 2024-04-28 MED ORDER — OLANZAPINE 5 MG PO TBDP: 5.0000 mg | ORAL_TABLET | Freq: Two times a day (BID) | ORAL | 3 refills | Status: DC

## 2024-04-28 MED ORDER — QUETIAPINE FUMARATE 200 MG PO TABS: 200.0000 mg | ORAL_TABLET | Freq: Every day | ORAL | 3 refills | Status: DC

## 2024-04-28 MED ORDER — NICOTINE 21 MG/24HR TD PT24
21.0000 mg | MEDICATED_PATCH | Freq: Every day | TRANSDERMAL | Status: DC
  Administered 2024-04-28: 21 mg via TRANSDERMAL
  Filled 2024-04-28: qty 1

## 2024-04-28 MED ORDER — QUETIAPINE FUMARATE 100 MG PO TABS: 200.0000 mg | ORAL_TABLET | Freq: Every day | ORAL | Status: DC

## 2024-04-28 MED ORDER — ACETAMINOPHEN 325 MG PO TABS: 650.0000 mg | ORAL_TABLET | ORAL | Status: DC | PRN

## 2024-04-28 MED ORDER — ONDANSETRON HCL 4 MG PO TABS: 4.0000 mg | ORAL_TABLET | Freq: Three times a day (TID) | ORAL | Status: DC | PRN

## 2024-04-28 MED ORDER — OLANZAPINE 5 MG PO TABS
5.0000 mg | ORAL_TABLET | Freq: Two times a day (BID) | ORAL | Status: DC
  Administered 2024-04-28: 5 mg via ORAL
  Filled 2024-04-28: qty 1

## 2024-04-28 MED ORDER — QUETIAPINE FUMARATE 200 MG PO TABS: 200.0000 mg | ORAL_TABLET | Freq: Every day | ORAL | 3 refills | Status: AC

## 2024-04-28 NOTE — ED Notes (Signed)
Cooperative with EKG.

## 2024-04-28 NOTE — ED Notes (Addendum)
 One bag pt belongings in the 1-4 section. Pt arrived wearing blue paper scrubs. Security to wand pt

## 2024-04-28 NOTE — ED Provider Notes (Signed)
 Emergency Medicine Observation Re-evaluation Note  Dustin Bennett is a 38 y.o. male, seen on rounds today.  Pt initially presented to the ED for complaints of No chief complaint on file. Currently, the patient is nad.  Physical Exam  BP 104/63   Pulse 66   Temp 98.3 F (36.8 C) (Oral)   Resp 18   SpO2 93%  Physical Exam General: nad Cardiac: RRR  Psych: No SI/HI  ED Course / MDM  EKG:   I have reviewed the labs performed to date as well as medications administered while in observation.  Recent changes in the last 24 hours include none .  Plan    Patient was seen because of aggressive behavior agitation.  Setting of drug abuse and noncompliance.  Was seen in ED yesterday.  Was disorganized.  No homicidal or suicidal ideation.  Seemed to be at baseline per his previous ED visits.  Patient received Geodon in the ED last night because of some agitation and aggression.  Felt like like this in setting of methamphetamine.  Plan is for patient to wake up and reevaluate.   Psychiatry saw the patient.  Cleared for discharge.  No self-harm or risk to others at this time.  Mentating well.  Denies SI and HI to me.  Able to be discharged at this point in time.  Neuro intact.  Sober.   Dustin Lavonia SAILOR, MD 04/28/24 [phone]

## 2024-04-28 NOTE — Discharge Instructions (Addendum)
 Please discontinue your methamphetamine use.  This can increase agitation.  I did provide information regarding resources.  If you ever have any thoughts about hurting yourself or others and then please come back to the ED immediately.

## 2024-04-28 NOTE — TOC Progression Note (Signed)
 Transition of Care [facility]) - Progression Note    Patient Details  Name: Dustin Bennett MRN: [id number] Date of Birth: 09/04/1985  Transition of Care [facility]) CM/SW Contact  Lorraine LILLETTE Fenton, LCSW Phone Number: 04/28/2024, 12:43 PM  Clinical Narrative:    CSW received a call from ED RN, pt is up for DC- states he has no money to buy prescriptions.  CSW reviewed chart- pt is insured.  There is no indication patient is uninsured.   Visited pt at the bedside, his face was mostly covered and the room was dark. He acknowledged that he was able to hear me and responded. CSW shared that I am showing he has insurance- and there is no medication assistance we can offer him. He responded ok.  I asked pt was he hoping to stay in the hospital a little longer, he agreed yes and was nodding head under partial covering blanket.  CSW explained if the MD determines medical needs are met- he will have to DC and cannot remain in the hospital. No further ICM needs at this time.      Barriers to Discharge: No Barriers Identified               Expected Discharge Plan and Services                                               Social Drivers of Health (SDOH) Interventions SDOH Screenings   Food Insecurity: No Food Insecurity (01/22/2024)  Housing: Low Risk  (01/22/2024)  Transportation Needs: Unmet Transportation Needs (01/22/2024)  Utilities: Not At Risk (01/22/2024)  Alcohol Screen: Low Risk  (01/22/2024)  Tobacco Use: High Risk (04/27/2024)    Readmission Risk Interventions     No data to display

## 2024-04-28 NOTE — ED Notes (Signed)
 Pt belongings (1 bag), moved with pt to [employer]

## 2024-04-28 NOTE — ED Notes (Signed)
 Sleeping. No changes. Will get EKG and move to TCU.

## 2024-04-28 NOTE — ED Notes (Signed)
 Pt provided with crackers, water and apple juice per provider verbal consent.

## 2024-04-28 NOTE — ED Notes (Signed)
 Pt refused to leave had to be escorted by security to the lobby where he will be picked up by his mother. RN explained to pt that he was given a prescription for his meds to take to pharmacy of his choosing.

## 2024-04-28 NOTE — ED Triage Notes (Addendum)
 [facility] from [facility]. States he needs abx for pneumonia. Asking if this is a mental health hospital. EDP present for triage. Pt denies he was at a different hospital tonight. Pt very agitated with provider and staff, not wanting to answer questions or not giving clear answers. Interrupting provider, speaking loudly. Security on standby

## 2024-04-28 NOTE — ED Notes (Signed)
 Pt refusing further care including vital signs and further work up. Pt irate and berating staff. MD notified pt of discharge. Security at bedside to escort pt out of ER

## 2024-04-28 NOTE — BH Assessment (Addendum)
 Clinician attempted to engage patient for his teleassessment.  When RN Rion put the machine in the room patient was on the phone and said this can wait until morning I don't want to do this now.  Patient went on to tell RN Rion she was violating his privacy and he wanted her to leave.  Pt will be seen in person by provider in the morning.

## 2024-04-28 NOTE — ED Provider Notes (Signed)
 [place] EMERGENCY DEPARTMENT AT [facility] Provider Note   CSN: [id number] Arrival date & time: 04/28/24  [id number]     Patient presents with: No chief complaint on file.   Dustin Bennett is a 38 y.o. male.   The history is provided by the patient. The history is limited by the condition of the patient (aggressive behavior and agitation).  Mental Health Problem Presenting symptoms: aggressive behavior, agitation and suicidal thoughts   Patient accompanied by:  Law enforcement Degree of incapacity (severity):  Severe Timing:  Constant Progression:  Unchanged Chronicity:  New Context: drug abuse and noncompliance   Treatment compliance:  Untreated Time since last dose of psychoactive medication: years. Relieved by:  Nothing Worsened by:  Nothing Ineffective treatments:  None tried Associated symptoms: weight change   Risk factors: hx of mental illness   Patient with Bipolar 1 presents after refusing care at [facility].  He is agitated and angry.  States he is suicidal but cannot elaborate on this.      Past Medical History:  Diagnosis Date   Bipolar 1 disorder (HCC)    Depression    HIV (human immunodeficiency virus infection) (HCC)      Prior to Admission medications   Medication Sig Start Date End Date Taking? Authorizing Provider  atorvastatin (LIPITOR) 20 MG tablet Take 20 mg by mouth daily. Patient not taking: Reported on 03/18/2024 12/07/23   [provider]  FLUoxetine  (PROZAC ) 20 MG capsule Take 1 capsule (20 mg total) by mouth daily. Patient not taking: Reported on 03/18/2024 03/18/24 03/18/25  Claudene Rover, MD  gabapentin  (NEURONTIN ) 100 MG capsule Take 2 capsules (200 mg total) by mouth 2 (two) times daily. Patient not taking: Reported on 03/18/2024 01/24/24   Jadapalle, Sree, MD  nicotine  (NICODERM CQ  - DOSED IN MG/24 HOURS) 14 mg/24hr patch Place 1 patch (14 mg total) onto the skin daily. Patient not taking: Reported on 03/18/2024 01/25/24   Jadapalle,  Sree, MD  OLANZapine  zydis (ZYPREXA ) 5 MG disintegrating tablet Take 1 tablet (5 mg total) by mouth 2 (two) times daily. Patient not taking: Reported on 04/28/2024 12/30/23   [name], [name], PA-C  QUEtiapine  (SEROQUEL ) 200 MG tablet Take 200 mg by mouth at bedtime. Patient not taking: Reported on 04/28/2024 11/17/23   [provider]  sertraline  (ZOLOFT ) 50 MG tablet Take 1 tablet (50 mg total) by mouth daily. Patient not taking: Reported on 03/18/2024 01/25/24   Jadapalle, Sree, MD    Allergies: Patient has no known allergies.    Review of Systems  Constitutional:  Negative for fever.  HENT:  Negative for facial swelling.   Respiratory:  Negative for wheezing and stridor.   Cardiovascular:  Negative for leg swelling.  Psychiatric/Behavioral:  Positive for agitation and suicidal ideas.   All other systems reviewed and are negative.   Updated Vital Signs BP 104/63   Pulse 66   Temp 98.3 F (36.8 C) (Oral)   Resp 18   SpO2 93%   Physical Exam Vitals and nursing note reviewed. Exam conducted with a chaperone present.  Constitutional:      General: He is not in acute distress.    Appearance: He is well-developed. He is not diaphoretic.  HENT:     Head: Normocephalic and atraumatic.     Nose: Nose normal.  Eyes:     Conjunctiva/sclera: Conjunctivae normal.     Pupils: Pupils are equal, round, and reactive to light.  Cardiovascular:     Rate  and Rhythm: Normal rate and regular rhythm.     Pulses: Normal pulses.     Heart sounds: Normal heart sounds.  Pulmonary:     Effort: Pulmonary effort is normal.     Breath sounds: Normal breath sounds. No wheezing or rales.  Abdominal:     General: Bowel sounds are normal.     Palpations: Abdomen is soft.     Tenderness: There is no abdominal tenderness. There is no guarding or rebound.  Musculoskeletal:        General: Normal range of motion.     Cervical back: Normal range of motion and neck supple.  Skin:    General:  Skin is warm and dry.     Capillary Refill: Capillary refill takes less than 2 seconds.  Neurological:     General: No focal deficit present.     Mental Status: He is alert and oriented to person, place, and time.     Deep Tendon Reflexes: Reflexes normal.  Psychiatric:        Behavior: Behavior is agitated and aggressive.     (all labs ordered are listed, but only abnormal results are displayed) Results for orders placed or performed during the hospital encounter of 04/28/24  Urine Drug Screen   Collection Time: 04/28/24  4:05 AM  Result Value Ref Range   Opiates NEGATIVE NEGATIVE   Cocaine NEGATIVE NEGATIVE   Benzodiazepines NEGATIVE NEGATIVE   Amphetamines POSITIVE (A) NEGATIVE   Tetrahydrocannabinol NEGATIVE NEGATIVE   Barbiturates NEGATIVE NEGATIVE   Methadone Scn, Ur NEGATIVE NEGATIVE   Fentanyl NEGATIVE NEGATIVE   DG Chest 2 View Result Date: 04/27/2024 CLINICAL DATA:  Cough and flu like symptoms. EXAM: CHEST - 2 VIEW COMPARISON:  December 25, 2023 FINDINGS: The heart size and mediastinal contours are within normal limits. Mild diffusely increased lung markings are seen without focal consolidation, pleural effusion or pneumothorax. The visualized skeletal structures are unremarkable. IMPRESSION: No active cardiopulmonary disease. Electronically Signed   By: Suzen Dials M.D.   On: 04/27/2024 23:54     Radiology: DG Chest 2 View Result Date: 04/27/2024 CLINICAL DATA:  Cough and flu like symptoms. EXAM: CHEST - 2 VIEW COMPARISON:  December 25, 2023 FINDINGS: The heart size and mediastinal contours are within normal limits. Mild diffusely increased lung markings are seen without focal consolidation, pleural effusion or pneumothorax. The visualized skeletal structures are unremarkable. IMPRESSION: No active cardiopulmonary disease. Electronically Signed   By: Suzen Dials M.D.   On: 04/27/2024 23:54     Procedures   Medications Ordered in the ED  acetaminophen  (TYLENOL )  tablet 650 mg (has no administration in time range)  ondansetron  (ZOFRAN ) tablet 4 mg (has no administration in time range)  alum & mag hydroxide-simeth (MAALOX/MYLANTA) 200-200-20 MG/5ML suspension 30 mL (has no administration in time range)  nicotine  (NICODERM CQ  - dosed in mg/24 hours) patch 21 mg (has no administration in time range)  ziprasidone (GEODON) injection 20 mg (20 mg Intramuscular Given 04/28/24 0408)  sterile water (preservative free) injection (10 mLs  Given 04/28/24 0409)                                    Medical Decision Making Patient seen for URI symptoms   Amount and/or Complexity of Data Reviewed External Data Reviewed: labs and notes.    Details: [facility] and labs [facility] reviewed  Labs: ordered.    Details:  UDs is positive for amphetamines (Other labs done at [facility] 2 hours prior to my evaluation)  Risk OTC drugs. Prescription drug management. Risk Details: Give geodon for aggressive behavior.  There is no active plan and patient is now sleeping post Geodon.  TTS orders placed.  Holding orders placed.       Final diagnoses:  None   The patient has been placed in psychiatric observation due to the need to provide a safe environment for the patient while obtaining psychiatric consultation and evaluation, as well as ongoing medical and medication management to treat the patient's condition.  The patient has not been placed under full IVC at this time.  ED Discharge Orders     None          Douglas Rooks, MD 04/28/24 [phone]

## 2024-04-28 NOTE — Consult Note (Signed)
 [facility] Psychiatric Consult Initial  Patient Name: .Dustin Bennett  MRN: [id number]  DOB: 05/12/1986  Consult Order details:  Orders (From admission, onward)     Start     Ordered   04/28/24 0537  CONSULT TO CALL ACT TEAM       Ordering Provider: Nettie Earing, MD  Provider:  (Not yet assigned)  Question:  Reason for Consult?  Answer:  Psych consult   04/28/24 0537             Mode of Visit: In person    Psychiatry Consult Evaluation  Service Date: April 28, 2024 LOS:  LOS: 0 days  Chief Complaint I'm here for this cough  Primary Psychiatric Diagnoses  Bipolar Disorder 2.  Amphetamine Abuse   Assessment  Dustin Bennett is a 38 y.o. male admitted: Presented to the EDfor 04/28/2024  3:50 AM for brought in by [name] from [facility] hospital asking for antibiotics for pneumonia. He carries the psychiatric diagnoses of bipolar disorder, MDD, anxiety, amphetamine abuse, cocaine abuse, cannabis abuse, schizoaffective disorder, suicidal thoughts and paranoid disorder and has a past medical history of  HIV, anemia, lymphocytopenia, syphilis, AKI, pulmonary embolism, pulmonary arterial hypertension and hemorrhoids.   His current presentation of calm and limited cooperation is most consistent with decompensated bipolar disorder and substance abuse. He meets criteria for outpatient follow up based on not being a danger to himself or others.  Current outpatient psychotropic medications include olanzapine  and quetiapine  and historically he has had a positive response to these medications. He was compliant with medications prior to admission as evidenced by patient report during psychiatric assessment; reliability of patient self report is questioned. On initial examination, patient is calm and guarded with a blanket pulled up around his head. Please see plan below for detailed recommendations.   Diagnoses:  Active Hospital problems: Principal Problem:   Bipolar disorder [facility]) Active  Problems:   Amphetamine abuse (HCC)    Plan   ## Psychiatric Medication Recommendations:  Restart home medications --olanzapine  5mg  PO BID --quetiapine  200mg  PO Q HS  ## Medical Decision Making Capacity: Not specifically addressed in this encounter  ## Further Work-up:  -- most recent EKG is ordered on 04/28/2024; QtC is pending  -- Pertinent labwork reviewed earlier this admission includes: CBC, CMP, UA, alcohol, UDS and HIV   ## Disposition:-- There are no psychiatric contraindications to discharge at this time  ## Behavioral / Environmental: - No specific recommendations at this time.     ## Safety and Observation Level:  - Based on my clinical evaluation, I estimate the patient to be at low risk of self harm in the current setting. - At this time, we recommend  routine. This decision is based on my review of the chart including patient's history and current presentation, interview of the patient, mental status examination, and consideration of suicide risk including evaluating suicidal ideation, plan, intent, suicidal or self-harm behaviors, risk factors, and protective factors. This judgment is based on our ability to directly address suicide risk, implement suicide prevention strategies, and develop a safety plan while the patient is in the clinical setting. Please contact our team if there is a concern that risk level has changed.  CSSR Risk Category:   Suicide Risk Assessment: Patient has following modifiable risk factors for suicide: medication noncompliance, which we are addressing by recommending outpatient psychiatric followup. Patient has following non-modifiable or demographic risk factors for suicide: male gender and psychiatric hospitalization Patient has the following  protective factors against suicide: Access to outpatient mental health care and Supportive family  Thank you for this consult request. Recommendations have been communicated to the primary team.  We  will sign off at this time.   Keena Heesch A Kline, NP       History of Present Illness  Relevant Aspects of Hospital ED Course:  Admitted on 04/28/2024 for  brought in by [facility] from [facility] asking for antibiotics for pneumonia. He carries the psychiatric diagnoses of bipolar disorder, MDD, anxiety, amphetamine abuse, cocaine abuse, cannabis abuse, schizoaffective disorder, suicidal thoughts and paranoid disorder and has a past medical history of  HIV, anemia, lymphocytopenia, syphilis, AKI, pulmonary embolism, pulmonary arterial hypertension and hemorrhoids.    Patient Report:  Dustin Bennett, is seen face to face by this provider, consulted with Dr. Larina; and chart reviewed on 04/28/24.  On evaluation Dustin Bennett reports he came to the hospital to be treated for his cough.  He denies being at a previous hospital and he says he does not know why he received PRN medication last night.  He reports during psychiatric assessment that he takes his quetiapine  200mg  at bedtime and  olanzapine  5mg  two times a day and that his outpatient prescriber is [facility] in [place]; he is unable or unwilling to clarify what [facility] stands for.  He denies substance use even though UDS is positive for amphetamines.  Patient endorses he is currently homeless and needs a place to stay.  He states he prefers to be left alone to sleep.  He denies any interest in treatment for substance use.  There is an element of malingering in addition to being uncomfortable with his cough.    During evaluation Dustin Bennett is laying on a stretcher with a blanket pulled up around his head in no acute distress.  He is alert & oriented x 4, calm, guarded and attentive for this assessment.  His mood is calm with congruent affect.  He has normal speech, and behavior.  Objectively there is no evidence of psychosis/mania or delusional thinking. Pt does not appear to be responding to internal or external stimuli.  Patient is able to converse  coherently, goal directed thoughts, no distractibility, or pre-occupation.  He denies suicidal/self-harm/homicidal ideation, psychosis, and paranoia.  Patient answered questions appropriately.    I personally spent a total of 60 minutes in the care of the patient today including preparing to see the patient, getting/reviewing separately obtained history, performing a medically appropriate exam/evaluation, counseling and educating, placing orders, documenting clinical information in the EHR, independently interpreting results, and coordinating care.  Psych ROS:  Depression: endorses history of Anxiety:  endorses history of Mania (lifetime and current): denies although recent behavior suggests otherwise  Psychosis: (lifetime and current): denies  Review of Systems  Respiratory:  Positive for cough.   Psychiatric/Behavioral:  Positive for substance abuse.   All other systems reviewed and are negative.    Psychiatric and Social History  Psychiatric History:  Information collected from patient and chart review  Prev Dx/Sx: bipolar disorder, MDD, anxiety, amphetamine abuse, cocaine abuse, cannabis abuse, schizoaffective disorder, suicidal thoughts and paranoid disorder  Current Psych Provider: [facility] in [facility] Meds (current): olanzapine  and quetiapine  Previous Med Trials: unknown Therapy: none  Prior Psych Hospitalization: [facility] and in [facility]  Prior Self Harm: yes Prior Violence: denies  Family Psych History: unknown Family Hx suicide: unknown  Social History:  Developmental Hx: WNL Educational Hx: GED Occupational Hx: Designer, television/film set  Hx: denies Living Situation: Homeless Spiritual Hx: unknown Access to weapons/lethal means: denies   Substance History Alcohol: denies  Tobacco: cigarettes Illicit drugs: methamphetamine, cocaine, THC Prescription drug abuse: unknown Rehab hx: unknown  Exam Findings  Physical Exam:  Vital Signs:  Temp:  [98.1 F (36.7 C)-99.8  F (37.7 C)] 98.3 F (36.8 C) (10/04 0355) Pulse Rate:  [66-122] 66 (10/04 0455) Resp:  [18-20] 18 (10/04 0455) BP: (104-154)/(63-91) 104/63 (10/04 0455) SpO2:  [93 %-100 %] 93 % (10/04 0455) Blood pressure 104/63, pulse 66, temperature 98.3 F (36.8 C), temperature source Oral, resp. rate 18, SpO2 93%. There is no height or weight on file to calculate BMI.  Physical Exam Vitals and nursing note reviewed.  Eyes:     Pupils: Pupils are equal, round, and reactive to light.  Pulmonary:     Effort: Pulmonary effort is normal.  Skin:    General: Skin is dry.  Neurological:     Mental Status: He is alert and oriented to person, place, and time.  Psychiatric:        Attention and Perception: Attention and perception normal.        Mood and Affect: Mood and affect normal.        Speech: Speech normal.        Behavior: Behavior normal. Behavior is cooperative.        Thought Content: Thought content normal.        Cognition and Memory: Cognition normal.     Mental Status Exam: General Appearance: Disheveled  Orientation:  Full (Time, Place, and Person)  Memory:  UTA clearly  Concentration:  Concentration: Fair  Recall:  UTA clearly  Attention  Fair  Eye Contact:  Minimal  Speech:  Clear and Coherent  Language:  Fair  Volume:  Normal  Mood: calm  Affect:  Congruent  Thought Process:  Coherent  Thought Content:  Logical  Suicidal Thoughts:  No  Homicidal Thoughts:  No  Judgement:  Impaired  Insight:  Lacking  Psychomotor Activity:  Normal  Akathisia:  No  Fund of Knowledge:  Fair      Assets:  Manufacturing systems engineer Leisure Time Social Support  Cognition:  WNL  ADL's:  Intact  AIMS (if indicated):        Other History   These have been pulled in through the EMR, reviewed, and updated if appropriate.  Family History:  The patient's family history is not on file.  Medical History: Past Medical History:  Diagnosis Date   Bipolar 1 disorder (HCC)    Depression     HIV (human immunodeficiency virus infection) (HCC)     Surgical History: Past Surgical History:  Procedure Laterality Date   LUNG BIOPSY       Medications:   Current Facility-Administered Medications:    acetaminophen  (TYLENOL ) tablet 650 mg, 650 mg, Oral, Q4H PRN, Palumbo, April, MD   alum & mag hydroxide-simeth (MAALOX/MYLANTA) 200-200-20 MG/5ML suspension 30 mL, 30 mL, Oral, Q6H PRN, Palumbo, April, MD   nicotine  (NICODERM CQ  - dosed in mg/24 hours) patch 21 mg, 21 mg, Transdermal, Daily, Palumbo, April, MD   ondansetron  (ZOFRAN ) tablet 4 mg, 4 mg, Oral, Q8H PRN, Palumbo, April, MD  Current Outpatient Medications:    atorvastatin (LIPITOR) 20 MG tablet, Take 20 mg by mouth daily. (Patient not taking: Reported on 03/18/2024), Disp: , Rfl:    FLUoxetine  (PROZAC ) 20 MG capsule, Take 1 capsule (20 mg total) by mouth daily. (Patient not taking: Reported on 03/18/2024),  Disp: 30 capsule, Rfl: 1   gabapentin  (NEURONTIN ) 100 MG capsule, Take 2 capsules (200 mg total) by mouth 2 (two) times daily. (Patient not taking: Reported on 03/18/2024), Disp: 120 capsule, Rfl: 0   nicotine  (NICODERM CQ  - DOSED IN MG/24 HOURS) 14 mg/24hr patch, Place 1 patch (14 mg total) onto the skin daily. (Patient not taking: Reported on 03/18/2024), Disp: 28 patch, Rfl: 0   OLANZapine  zydis (ZYPREXA ) 5 MG disintegrating tablet, Take 1 tablet (5 mg total) by mouth 2 (two) times daily. (Patient not taking: Reported on 04/28/2024), Disp: 60 tablet, Rfl: 0   QUEtiapine  (SEROQUEL ) 200 MG tablet, Take 200 mg by mouth at bedtime. (Patient not taking: Reported on 04/28/2024), Disp: , Rfl:    sertraline  (ZOLOFT ) 50 MG tablet, Take 1 tablet (50 mg total) by mouth daily. (Patient not taking: Reported on 03/18/2024), Disp: 30 tablet, Rfl: 0  Allergies: No Known Allergies  Amila Callies A Kline, NP

## 2024-04-28 NOTE — ED Provider Notes (Signed)
 Agree patient does not meet criteria for IVC   Dustin Tarkington, MD 04/28/24 [phone]

## 2024-04-28 NOTE — ED Notes (Signed)
 Tele psych cart wheeled into room for video consult. Pt became irate and yelling at this writer stating you're violating my privacy you need to leave now, I dont need that since ya'll dont want to help me. RN attempt to deescalate pt and explain necessity for consult. Pt continued to yell and escalate. RN exited room for safety. MD made aware.

## 2024-04-28 NOTE — ED Notes (Signed)
 Resting/ sleeping/ sedated. Geodon given around 4a. Rise and fall of chest noted. Repositions self. NAD, calm.

## 2024-04-30 LAB — GC/CHLAMYDIA PROBE AMP (~~LOC~~) NOT AT ARMC
Chlamydia: NEGATIVE
Comment: NEGATIVE
Comment: NORMAL
Neisseria Gonorrhea: NEGATIVE

## 2024-04-30 LAB — HIV-1/2 AB - DIFFERENTIATION
HIV 1 Ab: REACTIVE — AB
HIV 2 Ab: NONREACTIVE

## 2024-05-01 ENCOUNTER — Telehealth: Payer: Self-pay

## 2024-05-01 NOTE — Telephone Encounter (Signed)
 Notified by inpatient ID team that patient is living with HIV and is not currently in care. Tried calling Dustin Bennett, no answer.   Dr. Fayette saw the patient while he was admitted in 2021. He was first diagnosed at the age of 21 and was in care with Dustin Frederick, NP of the [facility] for a time.   No recent viral load or CD4 count on file. Forwarding to [facility] to assist with re-engaging patient in care.   Dustin Bennett [birth year] [address] [place] [address] [phone] (H)   Emergency Contact [name] Mother [phone]     Latest Reference Range & Units 04/27/24 21:08  HIV Screen 4th Generation wRfx Non Reactive  Reactive !  HIV 1 Ab Non Reactive  Reactive !  HIV 2 Ab Non Reactive  Non Reactive  Note  SEE COMMENT !  !: Data is abnormal  Latest Reference Range & Units 01/16/20 13:01  Absolute CD 4 Helper 359 - 1,519 /uL 279 (L)  % CD 4 Pos. Lymph. 30.8 - 58.5 % 27.9 (L)  (L): Data is abnormally low    [name], BSN, Charity fundraiser

## 2024-05-23 ENCOUNTER — Emergency Department: Payer: MEDICAID

## 2024-05-23 ENCOUNTER — Emergency Department
Admission: EM | Admit: 2024-05-23 | Discharge: 2024-05-24 | Disposition: A | Discharge status: Home / Self Care | Payer: MEDICAID | Attending: Emergency Medicine | Admitting: Emergency Medicine

## 2024-05-23 ENCOUNTER — Other Ambulatory Visit: Payer: Self-pay

## 2024-05-23 DIAGNOSIS — R4182 Altered mental status, unspecified: Secondary | ICD-10-CM | POA: Insufficient documentation

## 2024-05-23 DIAGNOSIS — Z21 Asymptomatic human immunodeficiency virus [HIV] infection status: Secondary | ICD-10-CM | POA: Insufficient documentation

## 2024-05-23 LAB — COMPREHENSIVE METABOLIC PANEL WITH GFR
ALT: 18 U/L (ref 0–44)
AST: 24 U/L (ref 15–41)
Albumin: 3.8 g/dL (ref 3.5–5.0)
Alkaline Phosphatase: 49 U/L (ref 38–126)
Anion gap: 9 (ref 5–15)
BUN: 11 mg/dL (ref 6–20)
CO2: 24 mmol/L (ref 22–32)
Calcium: 8.8 mg/dL — ABNORMAL LOW (ref 8.9–10.3)
Chloride: 102 mmol/L (ref 98–111)
Creatinine, Ser: 0.99 mg/dL (ref 0.61–1.24)
GFR, Estimated: >60 mL/min (ref >60)
Glucose, Bld: 92 mg/dL (ref 70–99)
Potassium: 3.4 mmol/L — ABNORMAL LOW (ref 3.5–5.1)
Sodium: 135 mmol/L (ref 135–145)
Total Bilirubin: 0.9 mg/dL (ref 0.0–1.2)
Total Protein: 6.8 g/dL (ref 6.5–8.1)

## 2024-05-23 LAB — URINE DRUG SCREEN, QUALITATIVE (ARMC ONLY)
Amphetamines, Ur Screen: POSITIVE — AB
Barbiturates, Ur Screen: NOT DETECTED
Benzodiazepine, Ur Scrn: NOT DETECTED
Cannabinoid 50 Ng, Ur ~~LOC~~: NOT DETECTED
Cocaine Metabolite,Ur ~~LOC~~: NOT DETECTED
MDMA (Ecstasy)Ur Screen: NOT DETECTED
Methadone Scn, Ur: NOT DETECTED
Opiate, Ur Screen: NOT DETECTED
Phencyclidine (PCP) Ur S: NOT DETECTED
Tricyclic, Ur Screen: POSITIVE — AB

## 2024-05-23 LAB — ETHANOL: Alcohol, Ethyl (B): <15 mg/dL (ref <15)

## 2024-05-23 LAB — CBC WITH DIFFERENTIAL/PLATELET
Abs Immature Granulocytes: 0.03 K/uL (ref 0.00–0.07)
Basophils Absolute: 0 K/uL (ref 0.0–0.1)
Basophils Relative: 1 %
Eosinophils Absolute: 0.1 K/uL (ref 0.0–0.5)
Eosinophils Relative: 2 %
HCT: 36.6 % — ABNORMAL LOW (ref 39.0–52.0)
Hemoglobin: 11.9 g/dL — ABNORMAL LOW (ref 13.0–17.0)
Immature Granulocytes: 1 %
Lymphocytes Relative: 16 %
Lymphs Abs: 1 K/uL (ref 0.7–4.0)
MCH: 30.7 pg (ref 26.0–34.0)
MCHC: 32.5 g/dL (ref 30.0–36.0)
MCV: 94.3 fL (ref 80.0–100.0)
Monocytes Absolute: 0.3 K/uL (ref 0.1–1.0)
Monocytes Relative: 6 %
Neutro Abs: 4.6 K/uL (ref 1.7–7.7)
Neutrophils Relative %: 74 %
Platelets: 208 K/uL (ref 150–400)
RBC: 3.88 MIL/uL — ABNORMAL LOW (ref 4.22–5.81)
RDW: 12.9 % (ref 11.5–15.5)
WBC: 6 K/uL (ref 4.0–10.5)
nRBC: 0 % (ref 0.0–0.2)

## 2024-05-23 MED ORDER — NALOXONE HCL 2 MG/2ML IJ SOSY
0.4000 mg | PREFILLED_SYRINGE | Freq: Once | INTRAMUSCULAR | Status: AC
  Administered 2024-05-23: 0.4 mg via INTRAVENOUS
  Filled 2024-05-23: qty 2

## 2024-05-23 NOTE — ED Provider Notes (Signed)
 [facility] Provider Note    Event Date/Time   First MD Initiated Contact with Patient 05/23/24 2059     (approximate)   History   Chief Complaint Drug Overdose   HPI  Dustin Bennett is a 38 y.o. male with past medical history of bipolar disorder, polysubstance abuse, and HIV who presents to the ED complaining of altered mental status.  Per EMS, patient's mother called because he did not seem to be acting normally and she was concerned that he may have overdosed on something.  Patient arousable to painful stimuli on arrival, will wake up and denies drug use, but then quickly goes back to sleep.  He refuses to answer other questions.     Physical Exam   Triage Vital Signs: ED Triage Vitals  Encounter Vitals Group     BP      Girls Systolic BP Percentile      Girls Diastolic BP Percentile      Boys Systolic BP Percentile      Boys Diastolic BP Percentile      Pulse      Resp      Temp      Temp src      SpO2      Weight      Height      Head Circumference      Peak Flow      Pain Score      Pain Loc      Pain Education      Exclude from Growth Chart     Most recent vital signs: Vitals:   05/23/24 2100 05/23/24 2130  BP: 110/71 96/69  Pulse: 78 78  Resp: 16 17  Temp:    SpO2: 97% 94%    Constitutional: Somnolent but arousable to painful stimuli. Eyes: Conjunctivae are normal.  Pinpoint pupils noted. Head: Atraumatic. Nose: No congestion/rhinnorhea. Mouth/Throat: Mucous membranes are moist.  Cardiovascular: Normal rate, regular rhythm. Grossly normal heart sounds.  2+ radial pulses bilaterally. Respiratory: Normal respiratory effort.  No retractions. Lungs CTAB. Gastrointestinal: Soft and nontender. No distention. Musculoskeletal: No lower extremity tenderness nor edema.  Neurologic:  No gross focal neurologic deficits are appreciated.    ED Results / Procedures / Treatments   Labs (all labs ordered are listed, but only  abnormal results are displayed) Labs Reviewed  CBC WITH DIFFERENTIAL/PLATELET - Abnormal; Notable for the following components:      Result Value   RBC 3.88 (*)    Hemoglobin 11.9 (*)    HCT 36.6 (*)    All other components within normal limits  COMPREHENSIVE METABOLIC PANEL WITH GFR - Abnormal; Notable for the following components:   Potassium 3.4 (*)    Calcium  8.8 (*)    All other components within normal limits  ETHANOL  URINE DRUG SCREEN, QUALITATIVE ([facility] ONLY)     EKG  ED ECG REPORT I, Carlin Palin, the attending physician, personally viewed and interpreted this ECG.   Date: 05/23/2024  EKG Time: 20:57  Rate: 73  Rhythm: normal sinus rhythm  Axis: Normal  Intervals:none  ST&T Change: None  RADIOLOGY CT head reviewed and interpreted by me with no hemorrhage or midline shift.  PROCEDURES:  Critical Care performed: No  Procedures   MEDICATIONS ORDERED IN ED: Medications  naloxone (NARCAN) injection 0.4 mg (0.4 mg Intravenous Given 05/23/24 2109)     IMPRESSION / MDM / ASSESSMENT AND PLAN / ED COURSE  I reviewed the triage  vital signs and the nursing notes.                              38 y.o. male with past medical history of bipolar disorder, polysubstance use, and HIV who presents to the ED for altered mental status and concern from his mother that he may have overdosed.  Patient's presentation is most consistent with acute presentation with potential threat to life or bodily function.  Differential diagnosis includes, but is not limited to, substance abuse, intracranial process, anemia, electrolyte abnormality, AKI, psychosis.  Patient nontoxic-appearing and in no acute distress, vital signs are unremarkable.  Patient arousable to painful stimuli and does not have any focal neurologic deficits on exam, is able to answer questions with clear speech when woken up.  He was given a trial dose of Narcan given pinpoint pupils, but this did not seem to have  any effect.  No reported trauma, but will check CT head given unclear history.  EKG without evidence of arrhythmia or ischemia, lab results are pending at this time.  CT head is negative for acute process, labs without significant anemia, leukocytosis, electrolyte abnormality, or AKI.  LFTs are unremarkable and ethanol level undetectable.  UDS pending at this time, suspect substance abuse as the source of his altered mental status.  Patient turned over to oncoming divider pending UDS results and reassessment.      FINAL CLINICAL IMPRESSION(S) / ED DIAGNOSES   Final diagnoses:  Altered mental status, unspecified altered mental status type     Rx / DC Orders   ED Discharge Orders     None        Note:  This document was prepared using Dragon voice recognition software and may include unintentional dictation errors.   Willo Dunnings, MD 05/23/24 [phone]

## 2024-05-23 NOTE — ED Notes (Signed)
I/O cath for urine  

## 2024-05-23 NOTE — ED Triage Notes (Addendum)
 Pt to ed fro home via [facility] for possible OD. Pt has HX of meth use.  Mother called 911.  98% on RA  pinpoint pupils no redsp depression 120/60 ETCO2 37 Pt refused to answer further questions for EMS.  Pt is speaking for ED staff. Pt flinches and cries out in pain when EKG stickers are removed.  Pt admitts to drug use to MD. But then pt drifts back off with some mild snoring respirations.

## 2024-05-23 NOTE — ED Provider Notes (Incomplete)
 11:00 PM   Assumed care at shift change.  Patient with history of polysubstance use disorder who presents with altered mental status.  Drug screen positive for amphetamines, tricyclics.  Ethanol level negative.  CT head normal.  Blood glucose normal.  Patient will need to be monitored until more awake.  Suspect substance use.

## 2024-05-24 MED ORDER — SODIUM CHLORIDE 0.9 % IV BOLUS (SEPSIS): 1000.0000 mL | Freq: Once | INTRAVENOUS | Status: DC

## 2024-05-24 NOTE — Discharge Instructions (Addendum)
 I suspect that your episode of altered mental status and drowsiness was related to your Seroquel .  I recommend holding this medication and talking to the doctor that has prescribed it for further recommendations.  Your lab work, head CT were reassuring today.  I recommend avoiding any other sedatives such as alcohol, medications over-the-counter such as Benadryl , illicit drugs for the next several days.

## 2024-05-24 NOTE — ED Notes (Signed)
Patient asleep, chest rise and fall observed.

## 2024-05-24 NOTE — ED Notes (Signed)
 Patient ambulated around the room without assistance, patient tolerated activity well with no complaints. Bed sheets changed. Patient also given sandwich and change of clothes.

## 2024-05-24 NOTE — ED Notes (Signed)
 Pt d/c with mother. Pt visualized getting into car. Pt ambulatory with steady gait and denies any questions about d/c instructions.

## 2024-07-16 ENCOUNTER — Other Ambulatory Visit (HOSPITAL_COMMUNITY): Payer: Self-pay

## 2024-08-11 ENCOUNTER — Emergency Department
Admission: EM | Admit: 2024-08-11 | Discharge: 2024-08-11 | Disposition: A | Discharge status: Other Acute Inpatient Hospital | Payer: MEDICAID | Attending: Emergency Medicine | Admitting: Emergency Medicine

## 2024-08-11 ENCOUNTER — Other Ambulatory Visit (HOSPITAL_COMMUNITY)
Admission: EM | Admit: 2024-08-11 | Discharge: 2024-08-12 | Disposition: A | Discharge status: Home / Self Care | Payer: MEDICAID | Source: Intra-hospital | Attending: Child & Adolescent Psychiatry | Admitting: Child & Adolescent Psychiatry

## 2024-08-11 ENCOUNTER — Other Ambulatory Visit: Payer: Self-pay

## 2024-08-11 DIAGNOSIS — F1414 Cocaine abuse with cocaine-induced mood disorder: Secondary | ICD-10-CM | POA: Diagnosis not present

## 2024-08-11 DIAGNOSIS — F419 Anxiety disorder, unspecified: Secondary | ICD-10-CM | POA: Diagnosis not present

## 2024-08-11 DIAGNOSIS — F319 Bipolar disorder, unspecified: Secondary | ICD-10-CM | POA: Insufficient documentation

## 2024-08-11 DIAGNOSIS — Z91148 Patient's other noncompliance with medication regimen for other reason: Secondary | ICD-10-CM | POA: Diagnosis not present

## 2024-08-11 DIAGNOSIS — F29 Unspecified psychosis not due to a substance or known physiological condition: Secondary | ICD-10-CM | POA: Diagnosis not present

## 2024-08-11 DIAGNOSIS — F1994 Other psychoactive substance use, unspecified with psychoactive substance-induced mood disorder: Secondary | ICD-10-CM | POA: Diagnosis present

## 2024-08-11 DIAGNOSIS — Z760 Encounter for issue of repeat prescription: Secondary | ICD-10-CM | POA: Insufficient documentation

## 2024-08-11 DIAGNOSIS — Z21 Asymptomatic human immunodeficiency virus [HIV] infection status: Secondary | ICD-10-CM | POA: Insufficient documentation

## 2024-08-11 DIAGNOSIS — F1490 Cocaine use, unspecified, uncomplicated: Secondary | ICD-10-CM | POA: Diagnosis not present

## 2024-08-11 DIAGNOSIS — F17200 Nicotine dependence, unspecified, uncomplicated: Secondary | ICD-10-CM | POA: Diagnosis not present

## 2024-08-11 DIAGNOSIS — Z79899 Other long term (current) drug therapy: Secondary | ICD-10-CM | POA: Insufficient documentation

## 2024-08-11 DIAGNOSIS — Z139 Encounter for screening, unspecified: Secondary | ICD-10-CM | POA: Insufficient documentation

## 2024-08-11 DIAGNOSIS — F22 Delusional disorders: Secondary | ICD-10-CM | POA: Insufficient documentation

## 2024-08-11 DIAGNOSIS — R45851 Suicidal ideations: Secondary | ICD-10-CM

## 2024-08-11 DIAGNOSIS — F39 Unspecified mood [affective] disorder: Secondary | ICD-10-CM | POA: Diagnosis not present

## 2024-08-11 DIAGNOSIS — R456 Violent behavior: Secondary | ICD-10-CM | POA: Diagnosis present

## 2024-08-11 DIAGNOSIS — F1410 Cocaine abuse, uncomplicated: Secondary | ICD-10-CM

## 2024-08-11 LAB — CBC
HCT: 46.4 % (ref 39.0–52.0)
Hemoglobin: 15.7 g/dL (ref 13.0–17.0)
MCH: 30.7 pg (ref 26.0–34.0)
MCHC: 33.8 g/dL (ref 30.0–36.0)
MCV: 90.6 fL (ref 80.0–100.0)
Platelets: 220 K/uL (ref 150–400)
RBC: 5.12 MIL/uL (ref 4.22–5.81)
RDW: 13.8 % (ref 11.5–15.5)
WBC: 4.7 K/uL (ref 4.0–10.5)
nRBC: 0 % (ref 0.0–0.2)

## 2024-08-11 LAB — URINE DRUG SCREEN
Amphetamines: POSITIVE — AB
Barbiturates: NEGATIVE
Benzodiazepines: NEGATIVE
Cocaine: POSITIVE — AB
Fentanyl: NEGATIVE
Methadone Scn, Ur: NEGATIVE
Opiates: NEGATIVE
Tetrahydrocannabinol: NEGATIVE

## 2024-08-11 LAB — COMPREHENSIVE METABOLIC PANEL WITH GFR
ALT: 31 U/L (ref 0–44)
AST: 59 U/L — ABNORMAL HIGH (ref 15–41)
Albumin: 4.7 g/dL (ref 3.5–5.0)
Alkaline Phosphatase: 78 U/L (ref 38–126)
Anion gap: 11 (ref 5–15)
BUN: 9 mg/dL (ref 6–20)
CO2: 23 mmol/L (ref 22–32)
Calcium: 9.7 mg/dL (ref 8.9–10.3)
Chloride: 99 mmol/L (ref 98–111)
Creatinine, Ser: 1.21 mg/dL (ref 0.61–1.24)
GFR, Estimated: >60 mL/min
Glucose, Bld: 113 mg/dL — ABNORMAL HIGH (ref 70–99)
Potassium: 3.7 mmol/L (ref 3.5–5.1)
Sodium: 134 mmol/L — ABNORMAL LOW (ref 135–145)
Total Bilirubin: 0.7 mg/dL (ref 0.0–1.2)
Total Protein: 7.9 g/dL (ref 6.5–8.1)

## 2024-08-11 LAB — RESP PANEL BY RT-PCR (RSV, FLU A&B, COVID)  RVPGX2
Influenza A by PCR: NEGATIVE
Influenza B by PCR: NEGATIVE
Resp Syncytial Virus by PCR: NEGATIVE
SARS Coronavirus 2 by RT PCR: NEGATIVE

## 2024-08-11 LAB — ETHANOL: Alcohol, Ethyl (B): <15 mg/dL

## 2024-08-11 MED ORDER — DIPHENHYDRAMINE HCL 50 MG/ML IJ SOLN: 50.0000 mg | Freq: Three times a day (TID) | INTRAMUSCULAR | Status: DC | PRN

## 2024-08-11 MED ORDER — TRAZODONE HCL 50 MG PO TABS
50.0000 mg | ORAL_TABLET | Freq: Every evening | ORAL | Status: DC | PRN
  Filled 2024-08-11: qty 1

## 2024-08-11 MED ORDER — HYDROXYZINE HCL 25 MG PO TABS
25.0000 mg | ORAL_TABLET | Freq: Three times a day (TID) | ORAL | Status: DC | PRN
  Filled 2024-08-11: qty 1

## 2024-08-11 MED ORDER — OLANZAPINE 5 MG PO TBDP: 5.0000 mg | ORAL_TABLET | Freq: Every day | ORAL | Status: DC

## 2024-08-11 MED ORDER — LORAZEPAM 1 MG PO TABS
1.0000 mg | ORAL_TABLET | Freq: Once | ORAL | Status: AC
  Administered 2024-08-11: 1 mg via ORAL
  Filled 2024-08-11: qty 1

## 2024-08-11 MED ORDER — ACETAMINOPHEN 325 MG PO TABS: 650.0000 mg | ORAL_TABLET | Freq: Four times a day (QID) | ORAL | Status: DC | PRN

## 2024-08-11 MED ORDER — HALOPERIDOL LACTATE 5 MG/ML IJ SOLN: 5.0000 mg | Freq: Three times a day (TID) | INTRAMUSCULAR | Status: DC | PRN

## 2024-08-11 MED ORDER — ALUM & MAG HYDROXIDE-SIMETH 200-200-20 MG/5ML PO SUSP: 30.0000 mL | ORAL | Status: DC | PRN

## 2024-08-11 MED ORDER — DIPHENHYDRAMINE HCL 50 MG PO CAPS: 50.0000 mg | ORAL_CAPSULE | Freq: Three times a day (TID) | ORAL | Status: DC | PRN

## 2024-08-11 MED ORDER — LORAZEPAM 2 MG/ML IJ SOLN: 2.0000 mg | Freq: Three times a day (TID) | INTRAMUSCULAR | Status: DC | PRN

## 2024-08-11 MED ORDER — OLANZAPINE 5 MG PO TBDP
10.0000 mg | ORAL_TABLET | Freq: Once | ORAL | Status: AC
  Administered 2024-08-11: 10 mg via ORAL
  Filled 2024-08-11: qty 2

## 2024-08-11 MED ORDER — MAGNESIUM HYDROXIDE 400 MG/5ML PO SUSP: 30.0000 mL | Freq: Every day | ORAL | Status: DC | PRN

## 2024-08-11 MED ORDER — HALOPERIDOL 5 MG PO TABS: 5.0000 mg | ORAL_TABLET | Freq: Three times a day (TID) | ORAL | Status: DC | PRN

## 2024-08-11 MED ORDER — HALOPERIDOL LACTATE 5 MG/ML IJ SOLN: 10.0000 mg | Freq: Three times a day (TID) | INTRAMUSCULAR | Status: DC | PRN

## 2024-08-11 NOTE — BH Assessment (Signed)
 Per [facility] Janalyn S.), patient to be referred out of system.  Referral information for Psychiatric Hospitalization faxed to;   [facility] [phone]), no appropriate bed.   Service Provider Phone  [facility]  [phone]  [facility]  [phone]  [facility]  [phone]  [facility]  [phone]  [facility]  [phone]  [facility]  [phone]  [facility]  [phone]  [facility]  [phone]  [facility]  [phone]  [facility]  [phone]  [facility]  [phone]  [place] [facility]  [phone]  [facility]  [phone]  [facility]  [phone]  [facility]  [phone]

## 2024-08-11 NOTE — ED Notes (Signed)
 Pt  received at the beginning of the shift bed resting. Pt did not verbalize distress or discomfort. Pt is clear and assertive.  Affect is  flat. Pt. Prefers to be called Dustin Bennett. Safety maintained.

## 2024-08-11 NOTE — ED Notes (Signed)
Psychiatry at bedside assessing patient.

## 2024-08-11 NOTE — ED Notes (Signed)
 Pt admitted to fbc. Belongings in locker 19. Patient alert & oriented x4. Pt on unit throughout shift q15 checks completed. Pt denies intent to harm self or others. Denies AVH. No signs of acute distress noted. No inappropriate behaviors observed or reported. Encouraged patient to notify staff if any thoughts of harm towards self or others arise. Patient verbalizes understanding and agreement. Pt skin intact.

## 2024-08-11 NOTE — ED Notes (Signed)
 Per psychiatry, recommending inpatient for patient. However, patient remains in voluntary status and is able to leave if he attempts to do so.

## 2024-08-11 NOTE — ED Notes (Signed)
 Pt refusing to give staff his belongings bags, stating, I just need to activate my card real quick. They ain't takin no more from me.  This RN requested MD come to evaluate patient, pt locked himself in quad BR. This RN knocked on door to let him know that he cannot close the door to the BR with his belongings still in his possession. When this RN opened BR door, pt seated on toilet with clothes still on, attempting to light his cigarette from his belongings. Pt instructed to get out of the bathroom and go into his assigned room. Pt left BR and began ambulating toward exit. Dr. Claudene attempted to stop patient from leaving, pt continued walking out of department, stating, I came for help and they don't wanna help so I'm leaving.  Pt exited department.

## 2024-08-11 NOTE — ED Notes (Signed)
 Patient belongings Pair slide sandals Pair of pants Patient bag (denies money or weapons in bag) Pair black under ware shirt

## 2024-08-11 NOTE — ED Notes (Signed)
 Belongings taken from patient. Patient refused to give staff a stack of cards that he would like to keep on his person. Pt is hyperfixated on his money and cards being stolen. Pt is currently voluntary. If EDP feels it is necessary to IVC patient or his stack of cards becomes a threat to himself or staff, they will be taken from him. Charge RN made aware.

## 2024-08-11 NOTE — Group Note (Signed)
 Group Topic: Emotional Regulation  Group Date: 08/11/2024 Start Time: 2000 End Time: 2030 Facilitators: Verdon Jacqualyn BRAVO, NT  Department: [facility]  Number of Participants: 8  Group Focus: daily focus Treatment Modality:  Individual Therapy Interventions utilized were group exercise Purpose: express feelings  Name: Dustin Bennett Date of Birth: 1985-11-16  MR: [id number]    Level of Participation: did not participate Quality of Participation: n/a Interactions with others: n/a Mood/Affect: n/a Triggers (if applicable): n/a Cognition: n/a Progress: Other Response: n/a Plan: patient will be encouraged to attend group  Patients Problems:  Patient Active Problem List   Diagnosis Date Noted   Substance induced mood disorder (HCC) 01/22/2024   Bipolar disorder (HCC) 01/22/2024   Depression 01/22/2024   Amphetamine use 01/22/2024   Cocaine use 01/22/2024   Bipolar disorder (HCC) 12/28/2023   MDD (major depressive disorder) 12/26/2023   Pneumonia 01/16/2020   Syphilis 08/22/2019   MDD (major depressive disorder), recurrent severe, 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

## 2024-08-11 NOTE — BH Assessment (Signed)
 Comprehensive Clinical Assessment (CCA) Note  08/11/2024 Dustin Bennett [id number]  Chief Complaint:  Chief Complaint  Patient presents with   Mental Health Problem   Visit Diagnosis: Bipolar   Dustin Bennett is a 39 year old male who presents to the ER due to having SI, and no longer have medications to address his mental health needs. He states he hasn't had his medications in approximately three days. He also states the thoughts of overdoing on medications. He also states he had an attempt approximately a month ago but didn't seek help. During the interview, the patient was calm, cooperative and pleasant. He denies HI and AV/H.  CCA Screening, Triage and Referral (STR)  Patient Reported Information How did you hear about us ? Self  What Is the Reason for Your Visit/Call Today? Patient having SI, and no longer have medications to address his mental health needs.  How Long Has This Been Causing You Problems? 1 wk - 1 month  What Do You Feel Would Help You the Most Today? Treatment for Depression or other mood problem   Have You Recently Had Any Thoughts About Hurting Yourself? Yes  Are You Planning to Commit Suicide/Harm Yourself At This time? Yes   Flowsheet Row ED from 08/11/2024 in [facility] Emergency Department at [facility] ED from 05/23/2024 in [facility] Emergency Department at [facility] ED from 04/27/2024 in [facility] Department at [facility]  C-SSRS RISK CATEGORY High Risk No Risk No Risk    Have you Recently Had Thoughts About Hurting Someone Sherral? No  Are You Planning to Harm Someone at This Time? No  Explanation: Pt continues to endorse SI. Pt denied HI.   Have You Used Any Alcohol or Drugs in the Past 24 Hours? Yes  How Long Ago Did You Use Drugs or Alcohol? Alcohol and Cocaine  What Did You Use and How Much? Pt reported drinking 1/2 of a mixed drink (liquor)   Do You Currently Have a Therapist/Psychiatrist?  No  Name of Therapist/Psychiatrist:    Have You Been Recently Discharged From Any Office Practice or Programs? No  Explanation of Discharge From Practice/Program: No data recorded    CCA Screening Triage Referral Assessment Type of Contact: Face-to-Face  Telemedicine Service Delivery:   Is this Initial or Reassessment?   Date Telepsych consult ordered in [facility]:    Time Telepsych consult ordered in [facility]:    Location of Assessment: [facility] ED  Provider Location: [facility] ED   Collateral Involvement: None provided   Does Patient Have a Court Appointed Legal Guardian? No  Legal Guardian Contact Information: n/a  Copy of Legal Guardianship Form: -- (n/a)  Legal Guardian Notified of Arrival: -- (n/a)  Legal Guardian Notified of Pending Discharge: -- (n/a)  If Minor and Not Living with Parent(s), Who has Custody? n/a  Is CPS involved or ever been involved? Never  Is APS involved or ever been involved? Never   Patient Determined To Be At Risk for Harm To Self or Others Based on Review of Patient Reported Information or Presenting Complaint? Yes, for Self-Harm  Method: No Plan  Availability of Means: No access or NA  Intent: Vague intent or NA  Notification Required: No need or identified person  Additional Information for Danger to Others Potential: -- (n/a)  Additional Comments for Danger to Others Potential: n/a  Are There Guns or Other Weapons in Your Home? No  Types of Guns/Weapons: n/a  Are These Weapons Safely Secured?  No  Who Could Verify You Are Able To Have These Secured: n/a  Do You Have any Outstanding Charges, Pending Court Dates, Parole/Probation? None reported  Contacted To Inform of Risk of Harm To Self or Others: Other: Comment (n/a)   Does Patient Present under Involuntary Commitment? No   [place] of Residence: [place]   Patient Currently Receiving the Following Services: Not Receiving Services   Determination of  Need: Emergent (2 hours)   Options For Referral: ED Visit; Inpatient Hospitalization   CCA Biopsychosocial Patient Reported Schizophrenia/Schizoaffective Diagnosis in Past: No   Strengths: Have some insight, seeking help, and stable housing.   Mental Health Symptoms Depression:  Change in energy/activity; Difficulty Concentrating; Worthlessness   Duration of Depressive symptoms: Duration of Depressive Symptoms: Greater than two weeks   Mania:  Increased Energy; Change in energy/activity; Racing thoughts; Overconfidence   Anxiety:   Difficulty concentrating; Restlessness; Tension; Worrying   Psychosis:  None   Duration of Psychotic symptoms:    Trauma:  N/A   Obsessions:  N/A   Compulsions:  N/A   Inattention:  N/A   Hyperactivity/Impulsivity:  N/A   Oppositional/Defiant Behaviors:  N/A   Emotional Irregularity:  N/A   Other Mood/Personality Symptoms:  No data recorded   Mental Status Exam Appearance and self-care  Stature:  Average   Weight:  Average weight   Clothing:  Age-appropriate; Neat/clean   Grooming:  Normal   Cosmetic use:  None   Posture/gait:  Normal   Motor activity:  -- (Within normal range)   Sensorium  Attention:  Normal   Concentration:  Normal   Orientation:  X5   Recall/memory:  Normal   Affect and Mood  Affect:  Appropriate; Full Range   Mood:  Depressed; Anxious   Relating  Eye contact:  Fleeting   Facial expression:  Anxious; Depressed; Responsive   Attitude toward examiner:  Cooperative   Thought and Language  Speech flow: Clear and Coherent   Thought content:  Appropriate to Mood and Circumstances   Preoccupation:  Ruminations   Hallucinations:  None   Organization:  Coherent; Intact   [employer] of Knowledge:  Fair   Intelligence:  Average   Abstraction:  Armed Forces Technical Officer:  Poor   Reality Testing:  Distorted   Insight:  Fair; Poor   Decision Making:  Normal   Social  Functioning  Social Maturity:  Isolates   Social Judgement:  Heedless; Chief Of Staff   Stress  Stressors:  Transitions   Coping Ability:  Human Resources Officer Deficits:  None   Supports:  Friends/Service system     Religion: Religion/Spirituality Are You A Religious Person?: No  Leisure/Recreation: Leisure / Recreation Do You Have Hobbies?: No  Exercise/Diet: Exercise/Diet Do You Exercise?: No Have You Gained or Lost A Significant Amount of Weight in the Past Six Months?: No Do You Follow a Special Diet?: No Do You Have Any Trouble Sleeping?: Yes   CCA Employment/Education Employment/Work Situation: Employment / Work Situation Employment Situation: Unemployed Patient's Job has Been Impacted by Current Illness: No Has Patient ever Been in Equities Trader?: No  Education: Education Is Patient Currently Attending School?: No Did You Have An Individualized Education Program (IIEP): No Did You Have Any Difficulty At [employer]?: No Patient's Education Has Been Impacted by Current Illness: No   CCA Family/Childhood History Family and Relationship History: Family history Marital status: Single Does patient have children?: No  Childhood History:  Childhood History By whom was/is  the patient raised?: Mother Did patient suffer any verbal/emotional/physical/sexual abuse as a child?: No Did patient suffer from severe childhood neglect?: No Has patient ever been sexually abused/assaulted/raped as an adolescent or adult?: No Was the patient ever a victim of a crime or a disaster?: No Witnessed domestic violence?: No Has patient been affected by domestic violence as an adult?: No       CCA Substance Use Alcohol/Drug Use: Alcohol / Drug Use Pain Medications: See MAR Prescriptions: See MAR Over the Counter: See MAR History of alcohol / drug use?: Yes Longest period of sobriety (when/how long): Unable to quantify Substance #1 Name of Substance 1: Alcohol Substance  #2 Name of Substance 2: Cannabis Substance #3 Name of Substance 3: Cocaine Substance #4 Name of Substance 4: Methamphetamine   ASAM's:  Six Dimensions of Multidimensional Assessment  Dimension 1:  Acute Intoxication and/or Withdrawal Potential:      Dimension 2:  Biomedical Conditions and Complications:      Dimension 3:  Emotional, Behavioral, or Cognitive Conditions and Complications:     Dimension 4:  Readiness to Change:     Dimension 5:  Relapse, Continued use, or Continued Problem Potential:     Dimension 6:  Recovery/Living Environment:     ASAM Severity Score:    ASAM Recommended Level of Treatment:     Substance use Disorder (SUD)    Recommendations for Services/Supports/Treatments:    Disposition Recommendation per psychiatric provider: Inpatient Treatment   DSM5 Diagnoses: Patient Active Problem List   Diagnosis Date Noted   Substance induced mood disorder (HCC) 01/22/2024   Bipolar disorder (HCC) 01/22/2024   Depression 01/22/2024   Amphetamine use 01/22/2024   Cocaine use 01/22/2024   Bipolar disorder (HCC) 12/28/2023   MDD (major depressive disorder) 12/26/2023   Pneumonia 01/16/2020   Syphilis 08/22/2019   MDD (major depressive disorder), recurrent severe, 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, [facility] Therapeutic Triage Specialist 08/11/2024 1:03 PM

## 2024-08-11 NOTE — ED Notes (Signed)
 PT very upset when told that blood work was ordered . Pt stated, I dont need any blood work drawn, I go to the Dr. Regularly and I/m not allowing you to take my blood I need some medication for my anxiety , I feel nervous, the patient went to his room and shut the door. The patient allowed the nurse to spek with him, upon assessment the patient stated, I dont want any medication, I'll be just fine if you leave me alone, just give it to me in the morning. And leave my door open, Thank you,.  Before leaving the room the nurse wanted to recheck the patients B/p .  The patients B/P initally was 153/106 via dinamapp.  The patient refused a manual re-check.  Will approach patient unhurriedly and will inform NP of current B/P.
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
## 2024-08-11 NOTE — ED Notes (Signed)
EDP at bedside assessing patient at this time.

## 2024-08-11 NOTE — ED Provider Notes (Addendum)
 "  [facility] Provider Note    Event Date/Time   First MD Initiated Contact with Patient 08/11/24 1119     (approximate)   History   Mental Health Problem   HPI  Dustin Bennett is a 39 y.o. male who presents to the ED for evaluation of Mental Health Problem   Patient presents to the ED requesting help, he is brought back to a room and then collects his belongings and walks out of the ED and reports he no longer needs help.  Ambulated with a steady gait.  Reports that he is fine and does not feel that giving us  any money.  Denies thoughts of harming himself as he walks out independently.  He is directed back to a hallway bed, voluntarily changes out and is more agreeable to evaluation.  He is requesting a refill of his olanzapine  and that I look into whoever has been stealing his money. Denies SI, HI. Refuses to answer about hallucinations   Physical Exam   Triage Vital Signs: ED Triage Vitals [08/11/24 1105]  Encounter Vitals Group     BP (!) 160/112     Girls Systolic BP Percentile      Girls Diastolic BP Percentile      Boys Systolic BP Percentile      Boys Diastolic BP Percentile      Pulse Rate (!) 112     Resp 16     Temp 98.6 F (37 C)     Temp Source Oral     SpO2 96 %     Weight 209 lb 7 oz (95 kg)     Height      Head Circumference      Peak Flow      Pain Score 0     Pain Loc      Pain Education      Exclude from Growth Chart     Most recent vital signs: Vitals:   08/11/24 1105  BP: (!) 160/112  Pulse: (!) 112  Resp: 16  Temp: 98.6 F (37 C)  SpO2: 96%    General: Awake, no distress.  CV:  Good peripheral perfusion.  Resp:  Normal effort.  Abd:  No distention.  MSK:  No deformity noted.  Neuro:  No focal deficits appreciated. Other:     ED Results / Procedures / Treatments   Labs (all labs ordered are listed, but only abnormal results are displayed) Labs Reviewed  COMPREHENSIVE METABOLIC PANEL WITH GFR -  Abnormal; Notable for the following components:      Result Value   Sodium 134 (*)    Glucose, Bld 113 (*)    AST 59 (*)    All other components within normal limits  ETHANOL  CBC  URINE DRUG SCREEN    EKG   RADIOLOGY   Official radiology report(s): No results found.  PROCEDURES and INTERVENTIONS:  Procedures  Medications  OLANZapine  zydis (ZYPREXA ) disintegrating tablet 10 mg (10 mg Oral Given 08/11/24 1147)  LORazepam  (ATIVAN ) tablet 1 mg (1 mg Oral Given 08/11/24 1147)     IMPRESSION / MDM / ASSESSMENT AND PLAN / ED COURSE  I reviewed the triage vital signs and the nursing notes.  Differential diagnosis includes, but is not limited to, malingering, overdose, polysubstance abuse  Patient walked out of the ED in no acute distress reported that he feels fine.  Discussed ED return precautions.  Doubt self-harm  He then returns, similar psychiatric assessment.  He is somewhat paranoid asking me to look into whoever has been stealing his money.  He is repeatedly requesting a pen so he can write down important information.  Will provide oral antipsychotics, anxiolytics and consult psychiatry.  Keep the patient voluntary for now.      FINAL CLINICAL IMPRESSION(S) / ED DIAGNOSES   Final diagnoses:  Encounter for medical screening examination     Rx / DC Orders   ED Discharge Orders     None        Note:  This document was prepared using Dragon voice recognition software and may include unintentional dictation errors.   Claudene Rover, MD 08/11/24 1123    Claudene Rover, MD 08/11/24 1214    Claudene Rover, MD 08/11/24 1252  "

## 2024-08-11 NOTE — ED Notes (Signed)
 Pt escorted back to quad by first nurse.

## 2024-08-11 NOTE — ED Triage Notes (Signed)
 Concerned he may have flu, was exposed to someone with COVID yesterday. Also, ran out medication 4 days ago (Olanzapine  5 mg ODT -- BID)  Also C/O SI  Patient appears anxious, paranoid. Avoids eye contact.

## 2024-08-11 NOTE — ED Provider Notes (Signed)
 Emergency Medicine Observation Re-evaluation Note  Dustin Bennett is a 39 y.o. male, seen on rounds today.  Pt initially presented to the ED for complaints of Mental Health Problem  Currently, the patient is is no acute distress. Denies any concerns at this time.  Physical Exam  Blood pressure (!) 145/92, pulse 98, temperature 98.2 F (36.8 C), temperature source Oral, resp. rate 16, weight 95 kg, SpO2 94%.  Physical Exam: General: No apparent distress Pulm: Normal WOB Neuro: Moving all extremities Psych: Resting comfortably     ED Course / MDM     I have reviewed the labs performed to date as well as medications administered while in observation.  Recent changes in the last 24 hours include: No acute events overnight.  Plan   Current plan: Patient accepting to [facility]  Patient is not under full IVC at this time.    Dustin Kirsch, MD 08/11/24 1623

## 2024-08-12 DIAGNOSIS — F22 Delusional disorders: Secondary | ICD-10-CM | POA: Diagnosis not present

## 2024-08-12 DIAGNOSIS — R45851 Suicidal ideations: Secondary | ICD-10-CM | POA: Diagnosis not present

## 2024-08-12 DIAGNOSIS — F1414 Cocaine abuse with cocaine-induced mood disorder: Secondary | ICD-10-CM | POA: Diagnosis not present

## 2024-08-12 DIAGNOSIS — F17200 Nicotine dependence, unspecified, uncomplicated: Secondary | ICD-10-CM | POA: Diagnosis not present

## 2024-08-12 MED ORDER — OLANZAPINE 5 MG PO TBDP
5.0000 mg | ORAL_TABLET | ORAL | Status: AC
  Administered 2024-08-12: 5 mg via ORAL
  Filled 2024-08-12: qty 1

## 2024-08-12 MED ORDER — OLANZAPINE 10 MG PO TBDP: 10.0000 mg | ORAL_TABLET | Freq: Every day | ORAL | 0 refills | Status: DC

## 2024-08-12 MED ORDER — OLANZAPINE 10 MG PO TBDP
10.0000 mg | ORAL_TABLET | Freq: Every day | ORAL | Status: DC
  Filled 2024-08-12: qty 7

## 2024-08-12 MED ORDER — OLANZAPINE 10 MG PO TBDP: 10.0000 mg | ORAL_TABLET | Freq: Every day | ORAL | 0 refills | Status: AC

## 2024-08-12 NOTE — Discharge Instructions (Addendum)
 Discharge recommendations:   30 day script provided for 10 mg of zyprexa  by mouth at bedtime.   Medications: Patient is to take medications as prescribed. The patient or patient's guardian is to contact a medical professional and/or outpatient provider to address any new side effects that develop. The patient or the patient's guardian should update outpatient providers of any new medications and/or medication changes.    Outpatient Follow up: Please review list of outpatient resources for psychiatry and counseling. Please follow up with your primary care provider for all medical related needs.    Therapy: We recommend that patient participate in individual therapy to address mental health concerns.   Atypical antipsychotics: If you are prescribed an atypical antipsychotic, it is recommended that your height, weight, BMI, blood pressure, fasting lipid panel, and fasting blood sugar be monitored by your outpatient providers.  Safety:   The following safety precautions should be taken:   No sharp objects. This includes scissors, razors, scrapers, and putty knives.   Chemicals should be removed and locked up.   Medications should be removed and locked up.   Weapons should be removed and locked up. This includes firearms, knives and instruments that can be used to cause injury.   The patient should abstain from use of illicit substances/drugs and abuse of any medications.  If symptoms worsen or do not continue to improve or if the patient becomes actively suicidal or homicidal then it is recommended that the patient return to the closest hospital emergency department, the [facility], or call 911 for further evaluation and treatment. National Suicide Prevention Lifeline 1-800-SUICIDE or 431-770-5791.  About 988 988 offers 24/7 access to trained crisis counselors who can help people experiencing mental health-related distress. People can call or text 988 or  chat 988lifeline.org for themselves or if they are worried about a loved one who may need crisis support.   [facility] [address] [place], [place] [phone]  SUBSTANCE USE TREATMENT for Medicaid and State Funded/IPRS  [facility] [address], [place], [address] [phone] phone NOTE: [facility] is no longer offering IOP services.  Serves those who are low-income or have no insurance.  [facility] [address] [place], [place], [address] [phone] phone [phone] fax NOTE: Does have Substance Abuse-Intensive Outpatient Program [facility]) as well as transitional housing if eligible.  [facility] [address] [place], [place], [address] [phone] phone [phone] fax  [facility] [phone] [place], [place], [address] [phone] phone [phone] fax  HALFWAY HOUSES:  [facility] [phone]  [employer].oxfordvacancies.com  12 STEP PROGRAMS:  Alcoholics Anonymous of [place] softwarechalet.be  Narcotics Anonymous of [place] hitprotect.dk  Al-Anon of [place], [place] www.[place]alanon.org/find-meetings.html  Nar-Anon https://nar-anon.org/find-a-meeting  List of Residential placements:   [facility] in [place]: [phone]  [facility]: [phone]  [place], [place] [phone]: Male and male facility; 30-day program: (uninsured and Medicaid such as [place], [place], [place], partners)  [facility]: [phone]; men and women's facility; 28 days; Can have Medicaid tailored plan Tour Manager or Partners)  [facility]: [phone] [place]; 28 day program; must be fully detox; tailored Medicaid or no insurance  [address] in [place], [place]; [phone]; 28 day all males program; no insurance accepted  [facility]  Referral in [place]: [place] [phone] (no insurance or Medicaid only); 90 days; outpatient services but provide housing in apartments [place]  [facility] Admission: [phone]: Patient must complete phone screening for placement: [place], [place]; 6 month program; uninsured, Medicaid, and [employer] financial.   [facility]: no insurance required; [phone]  [facility]: [phone]; Intake: [place]; Must fill out application online; Steffan Delay [phone] [facility] in [place], [place]: [phone]; Admissions Coordinators Mr. Marinda or Alm Eagles; 90 day program.  [facility]: [place], [place] [phone]; Co-Ed 9 month to a year program; Online application; Men entry fee is $500 (6-57months);  [employer]: [address] [place], [place] [address]; no fee or insurance required; minimum of 2 years; Highly structured; work based; Intake Coordinator is [place] [phone]  [facility] in [place], [place]: [phone]; Fax number is [phone]; website: www.[facility].org; Requires 3-6 page autobiography; 2 year program (18 months and then 74month transitional housing); Admission fee is $300; no insurance needed; work Automotive Engineer in [place], [place]: [employer] Desk Staff: [place] [phone]: They have a Men's Regenerations Program 6-73months. Free program; There is an initial $300 fee however, they are willing to work with patients regarding that. Application is online.  [facility]: Admissions [phone] [place] Free ext [phone]; Any 7-90 day program is out of pocket; 12 month program is free of charge; there is a $275 entry fee; Patient is responsible for own transportation

## 2024-08-12 NOTE — ED Notes (Signed)
 Pt requested to signout AMA.AMA form provided and signed.

## 2024-08-12 NOTE — ED Notes (Signed)
 [facility] called [phone] 08/12/24 seeing if patient was coming today as patient was accepted to there, found out through asking that patient went to [facility] behavior 08/11/24. Informed Brittany at [facility] 2 patient went to [facility].

## 2024-08-12 NOTE — ED Notes (Signed)
 Pt dc to home via [employer].

## 2024-08-12 NOTE — ED Provider Notes (Signed)
 FBC/OBS ASAP Discharge Summary  Date and Time: 08/12/2024 11:14 AM Name: Dustin Bennett  MRN:  [id number]   Discharge Diagnoses:  Final diagnoses:  Substance induced mood disorder (HCC)  Cocaine use disorder (HCC)  Tobacco use disorder    Subjective: Patient seen face-to-face by this provider, consulted with Dustin Bennett and chart reviewed on 08/12/2024. On evaluation, patient is sitting on the couch by the nurses station on the [facility] unit. He is alert and oriented x 4. Reports that he had presented to [facility] ED yesterday because he ran out of his Zyprexa  for 3-4 days since it had no refills left. He reports that his Zyprexa  script had been transferred from [facility] and he had been getting it from [facility] now. He reports that the Zyprexa  helps with his mood and paranoia, but he missed his follow-up appointment with [facility] months ago. Discussed with patient that yesterday while he was in the ED he told staff members different things related to his suicidal thoughts. He told some that he had suicidal thoughts with no plan, but then told the counselor that he had SI with thoughts of overdosing on medications. He reports that he did have suicidal thoughts yesterday but no plan or intent. Denies that he had any intention to overdose on medications either. He reports that yesterday he was stressed because he had a flashback to the recent end of a relationship he was in. He denies recently engaging in any self-injurious or non-suicidal self-harm behaviors. He reports cocaine use off and on for the past 6 months of 1-2 times a week. He reports using cocaine when he is stressed, is craving it, and to get him feeling excited. He reports THC use 1-2 times a week. Denies use of any other illicit drugs or alcohol. UDS on 08/11/2024 was positive for amphetamines and cocaine with BAL <15. Patient reports desire to be discharged and to sign the AMA form. Discussed with the patient continued recommendation for stay on the facility  based crisis unit for medically supervised detoxification and placement at a residential rehabilitation treatment facility. Given the patient's history of frequent polysubstance use and current presentation most consistent with substance induced mood disorder, medically supervised detoxification is indicated. Explained benefits that detox services will support safe withdrawal management, medication adjustments, and addressing of continued psychiatric issues. This will also help ensure linkage to ongoing treatment for substance use disorders. Patient denies interest in continued admission to [facility] and lack of interest in completing rehab. Patient reports [facility] and 5 other places. Patient does not show readiness to quit using drugs. He denies paranoia, but when this provider shares what he reported in the ED yesterday he shares that his paranoia related to thinking that someone is stealing his money is chronic for him but his Zyprexa  helps decrease it.   Discussed that he was recommended for inpatient psychiatric hospitalization yesterday due to fluctuating reports of SI and the recommendation today is for inpatient treatment in the [facility] unit due to mood disturbance in the context of recent drug use. Patient is not agreeable to continued [facility] admission or inpatient psychiatric hospitalization. Patient signed the AMA form on 08/12/2024 at 10:01 AM. Despite education provided on the risks of leaving against medical advice-including the potential for relapse and exacerbation of psychiatric symptoms, the patient remained adamant about discharge. He would rather follow-up with his outpatient provider ([facility]) on Monday. He reports that he has been compliant with  his Zyprexa  up until 3-4 days ago because he ran out. Reports he has been taking 5 mg of zyprexa  ODT in the morning and 5 mg at bedtime for the past 6 months and that he'll take 10 mg at bedtime too if he feels like he  needs it. Educated patient about taking medications as prescribed and not taking more then prescribed without doctor's recommendations and patient demonstrates understanding. Patient prefers to take 10 mg of zyprexa  ODT at bedtime since it has worked better for him in the past and he has been provided a 30 days script for it along with a 7 day supply. Patient is agreeable to follow-up with [facility] on Monday, 08/13/2024 for continued medication management and counseling. Also discussed option to establish ACT team services given his chronic presentation to ED's for psychiatric related concerns. He lives with his mother in [place] and provided verbal consent for this provider to discuss his course of care and discharge recommendations with his mother Dustin Bennett). Patient said that his mother is fine with him returning back home to her and he'll need a taxi to get to her house.  On evaluation, he is in no acute distress. He is alert and oriented x 4. Calm and cooperative during his assessment and easily redirectable. Mood is anxious and euthymic and his affect is congruent with his mood. His speech is clear, coherent, and logical. He does appear to be in a manic/hypomanic state likely due to recent substance use (cocaine & THC). He is hyperactive and restless. He denies paranoia today, but does report a chronic hx of paranoid delusions related to thinking that someone is stealing his money. He doesn't appear to be responding to internal or external stimuli. His speech is clear, coherent, and logical. He is circumstantial some and is easily distractible at times. He does endorse recently having unprotected sex and was provided education on safe sex practices. He denies suicidal and homicidal ideations. He denies auditory and visual hallucinations. He denies thought insertion/withdrawal and thought broadcasting. He identifies his protective factors from committing suicide as his family and good insight into his need  to continue medications for his mood, which he has been consistently taking until he ran out of his medicine. Coping skills include taking a walk, watching a movie, and spending time with a supportive friend.   Spoke to patient's mother Nicolai Labonte) at 10:11 AM to update her on patient's presentation of psychiatric symptoms since yesterday and discharge recommendations as he has signed an AMA form. She reports that his current presentation is at his baseline and normal when he is using drugs. He has paranoia that other people are stealing his money when he is using drugs and every 6 weeks they go through this. She reports that he missed his [facility] appointment months ago and then he was supposed to have a virtual session with a counselor which he missed as well. She has no safety concerns with him returning back home. Denies any access to guns and weapons. She is willing to ensure that her son follows up with [facility] tomorrow, 08/13/2024 for medication management and counseling. Also discussed ACT team services and potential benefits.   Labs reviewed from 08/11/2024 with the patient: -CMP with GFR: within normal ranges besides mildly low sodium at 134 (no intervention needed at this time), glucose elevated at 113, ast elevated at 59 (likely due to drug use). CBC within normal ranges. Respiratory panel by RT-PCR negative.   Stay Summary: Patient originally presented  to [facility] ED yesterday, 08/11/2024 because he thought he may have the flu as he reported he was exposed to someone with COVID yesterday. Also since he ran out of his medicine 4 days ago (olanzapine  ODT 5 mg bid) and suicidal thoughts. Upon chart review of staff notes during his [facility] ED stay, there were reports of him appearing anxious, paranoid, and avoiding eye contact. He had asked a staff member to look into whoever was stealing his money and according to nursing note was hyper fixated on his cards being stolen. Upon admission to the ED, he had locked  himself in the bathroom as well and had originally refused to give his belongings to staff. He reported to the counselor yesterday that he had thoughts of overdosing on medications and that he had a suicide attempt a month ago, but didn't seek help. He was referred out for inpatient psychiatric hospitalization yesterday since [facility] didn't have a bed available. He was admitted to the [facility] unit yesterday evening. He was uncooperative with care at times. According to nursing notes last night around 9 pm, he was upset that blood work had been ordered and told the nurse that he didn't need any drawn because he goes to his doctor regularly. He requested for some medication for anxiety due to feeling nervous, but then he went to his room and shut the door. The nurse spoke to him and he had changed his mind about wanting medicine and asked the nurse to leave him alone and that he would take it in the morning. He refused his blood pressure to be rechecked last night as it was elevated. Since early this morning around 1:13 am according to nursing notes, patient was refusing medications, getting his blood pressure rechecked, and verbalized that he'd be leaving in the morning. Around 3:30 AM according to nursing note, he also had some labile behavior but was able to be redirected. Patient has been easily redirectable and cooperative with care this morning, but has been disruptive to the milieu.   Medication management:  -1 time dose of Zyprexa  ODT 5 mg for mood stabilization  Total Time spent with patient: 45 minutes  Past Psychiatric History: bipolar disorder, ADHD, MDD, polysubstance abuse, substance induced mood disorder Past Medical History:  Past Medical History:  Diagnosis Date   Bipolar 1 disorder (HCC)    Depression    HIV (human immunodeficiency virus infection) (HCC)    Family History: Unknown Family Psychiatric History: Unknown Social History: Lives with his mother Tobacco Cessation:  A prescription  for an FDA-approved tobacco cessation medication was offered at discharge and the patient refused  Current Medications:  Current Facility-Administered Medications  Medication Dose Route Frequency Provider Last Rate Last Admin   acetaminophen  (TYLENOL ) tablet 650 mg  650 mg Oral Q6H PRN Smith, Annie B, NP       alum & mag hydroxide-simeth (MAALOX/MYLANTA) 200-200-20 MG/5ML suspension 30 mL  30 mL Oral Q4H PRN Smith, Annie B, NP       haloperidol  (HALDOL ) tablet 5 mg  5 mg Oral TID PRN Smith, Annie B, NP       And   diphenhydrAMINE  (BENADRYL ) capsule 50 mg  50 mg Oral TID PRN Smith, Annie B, NP       haloperidol  lactate (HALDOL ) injection 5 mg  5 mg Intramuscular TID PRN Smith, Annie B, NP       And   diphenhydrAMINE  (BENADRYL ) injection 50 mg  50 mg Intramuscular TID PRN Smith, Annie B, NP  And   LORazepam  (ATIVAN ) injection 2 mg  2 mg Intramuscular TID PRN Smith, Annie B, NP       haloperidol  lactate (HALDOL ) injection 10 mg  10 mg Intramuscular TID PRN Smith, Annie B, NP       And   diphenhydrAMINE  (BENADRYL ) injection 50 mg  50 mg Intramuscular TID PRN Smith, Annie B, NP       And   LORazepam  (ATIVAN ) injection 2 mg  2 mg Intramuscular TID PRN Smith, Annie B, NP       hydrOXYzine  (ATARAX ) tablet 25 mg  25 mg Oral TID PRN Smith, Annie B, NP       magnesium  hydroxide (MILK OF MAGNESIA) suspension 30 mL  30 mL Oral Daily PRN Smith, Annie B, NP       OLANZapine  zydis (ZYPREXA ) disintegrating tablet 10 mg  10 mg Oral QHS Lynix Bonine, NP       traZODone  (DESYREL ) tablet 50 mg  50 mg Oral QHS PRN Smith, Annie B, NP       Current Outpatient Medications  Medication Sig Dispense Refill   atorvastatin (LIPITOR) 20 MG tablet Take 20 mg by mouth daily. (Patient not taking: Reported on 03/18/2024)     CABENUVA 600 & 900 MG/3ML injection Inject 1 kit into the muscle every 30 (thirty) days.     FLUoxetine  (PROZAC ) 20 MG capsule Take 1 capsule (20 mg total) by mouth daily. (Patient not taking:  Reported on 03/18/2024) 30 capsule 1   gabapentin  (NEURONTIN ) 100 MG capsule Take 2 capsules (200 mg total) by mouth 2 (two) times daily. (Patient not taking: Reported on 03/18/2024) 120 capsule 0   NARCAN  4 MG/0.1ML LIQD nasal spray kit Place 1 spray into the nose as directed.     nicotine  (NICODERM CQ  - DOSED IN MG/24 HOURS) 14 mg/24hr patch Place 1 patch (14 mg total) onto the skin daily. (Patient not taking: Reported on 03/18/2024) 28 patch 0   OLANZapine  zydis (ZYPREXA ) 10 MG disintegrating tablet Take 1 tablet (10 mg total) by mouth at bedtime. 30 tablet 0   QUEtiapine  (SEROQUEL ) 200 MG tablet Take 1 tablet (200 mg total) by mouth at bedtime. 90 tablet 3   sertraline  (ZOLOFT ) 50 MG tablet Take 1 tablet (50 mg total) by mouth daily. (Patient not taking: Reported on 03/18/2024) 30 tablet 0    PTA Medications:  PTA Medications  Medication Sig   atorvastatin (LIPITOR) 20 MG tablet Take 20 mg by mouth daily. (Patient not taking: Reported on 03/18/2024)   nicotine  (NICODERM CQ  - DOSED IN MG/24 HOURS) 14 mg/24hr patch Place 1 patch (14 mg total) onto the skin daily. (Patient not taking: Reported on 03/18/2024)   sertraline  (ZOLOFT ) 50 MG tablet Take 1 tablet (50 mg total) by mouth daily. (Patient not taking: Reported on 03/18/2024)   gabapentin  (NEURONTIN ) 100 MG capsule Take 2 capsules (200 mg total) by mouth 2 (two) times daily. (Patient not taking: Reported on 03/18/2024)   FLUoxetine  (PROZAC ) 20 MG capsule Take 1 capsule (20 mg total) by mouth daily. (Patient not taking: Reported on 03/18/2024)   QUEtiapine  (SEROQUEL ) 200 MG tablet Take 1 tablet (200 mg total) by mouth at bedtime.   CABENUVA 600 & 900 MG/3ML injection Inject 1 kit into the muscle every 30 (thirty) days.   NARCAN  4 MG/0.1ML LIQD nasal spray kit Place 1 spray into the nose as directed.   OLANZapine  zydis (ZYPREXA ) 10 MG disintegrating tablet Take 1 tablet (10 mg total) by mouth at  bedtime.   Facility Ordered Medications  Medication    [COMPLETED] OLANZapine  zydis (ZYPREXA ) disintegrating tablet 10 mg   [COMPLETED] LORazepam  (ATIVAN ) tablet 1 mg   acetaminophen  (TYLENOL ) tablet 650 mg   alum & mag hydroxide-simeth (MAALOX/MYLANTA) 200-200-20 MG/5ML suspension 30 mL   magnesium  hydroxide (MILK OF MAGNESIA) suspension 30 mL   haloperidol  (HALDOL ) tablet 5 mg   And   diphenhydrAMINE  (BENADRYL ) capsule 50 mg   haloperidol  lactate (HALDOL ) injection 5 mg   And   diphenhydrAMINE  (BENADRYL ) injection 50 mg   And   LORazepam  (ATIVAN ) injection 2 mg   haloperidol  lactate (HALDOL ) injection 10 mg   And   diphenhydrAMINE  (BENADRYL ) injection 50 mg   And   LORazepam  (ATIVAN ) injection 2 mg   traZODone  (DESYREL ) tablet 50 mg   hydrOXYzine  (ATARAX ) tablet 25 mg   [COMPLETED] OLANZapine  zydis (ZYPREXA ) disintegrating tablet 5 mg   OLANZapine  zydis (ZYPREXA ) disintegrating tablet 10 mg       08/12/2024   10:30 AM  Depression screen PHQ 2/9  Decreased Interest 0  Down, Depressed, Hopeless 0  PHQ - 2 Score 0  Altered sleeping 2  Tired, decreased energy 2  Change in appetite 2  Feeling bad or failure about yourself  2  Trouble concentrating 0  Moving slowly or fidgety/restless 0  Suicidal thoughts 0  PHQ-9 Score 8  Difficult doing work/chores Very difficult    Flowsheet Row ED from 08/11/2024 in [facility] Most recent reading at 08/11/2024  6:33 PM ED from 08/11/2024 in [facility] Emergency Department at [facility] Most recent reading at 08/11/2024 12:47 PM ED from 05/23/2024 in [facility] Emergency Department at [facility] Most recent reading at 05/23/2024  9:05 PM  C-SSRS RISK CATEGORY No Risk High Risk No Risk    Musculoskeletal  Strength & Muscle Tone: within normal limits Gait & Station: normal Patient leans: N/A  Psychiatric Specialty Exam  Presentation  General Appearance:  Appropriate for Environment; Casual  Eye Contact: Fair  Speech: Clear and  Coherent  Speech Volume: Normal  Handedness: Right   Mood and Affect  Mood: Euthymic  Affect: Appropriate   Thought Process  Thought Processes: Coherent  Descriptions of Associations:Intact  Orientation:Full (Time, Place and Person)  Thought Content:Logical  Diagnosis of Schizophrenia or Schizoaffective disorder in past: No    Hallucinations:No data recorded Ideas of Reference:None  Suicidal Thoughts:No data recorded Homicidal Thoughts:No data recorded  Sensorium  Memory: Recent Fair; Immediate Fair; Remote Fair  Judgment: Fair  Insight: Fair   Art Therapist  Concentration: Fair  Attention Span: Fair  Recall: [employer] of Knowledge: Fair  Language: Fair   Psychomotor Activity  Psychomotor Activity:No data recorded  Assets  Assets: Communication Skills; Desire for Improvement; Resilience; Social Support   Sleep  Sleep:No data recorded Estimated Sleeping Duration (Last 24 Hours): 4.50-5.50 hours  No data recorded  Physical Exam  Physical Exam Vitals and nursing note reviewed.  Constitutional:      Appearance: Normal appearance.  HENT:     Head: Normocephalic.     Nose: Nose normal.  Cardiovascular:     Rate and Rhythm: Normal rate.  Pulmonary:     Effort: Pulmonary effort is normal.  Musculoskeletal:        General: Normal range of motion.     Cervical back: Normal range of motion.  Neurological:     Mental Status: He is alert and oriented to person, place, and time.  Psychiatric:  Attention and Perception: Attention normal. He does not perceive auditory or visual hallucinations.        Mood and Affect: Affect normal. Mood is anxious.        Speech: Speech normal.        Behavior: Behavior is uncooperative.        Thought Content: Thought content is paranoid and delusional. Thought content does not include homicidal or suicidal ideation. Thought content does not include homicidal or suicidal plan.         Cognition and Memory: Cognition normal.        Judgment: Judgment is inappropriate.    Review of Systems  Constitutional: Negative.   HENT: Negative.    Eyes: Negative.   Respiratory: Negative.    Cardiovascular: Negative.   Gastrointestinal: Negative.   Genitourinary: Negative.   Musculoskeletal: Negative.   Skin: Negative.   Neurological: Negative.   Endo/Heme/Allergies: Negative.   Psychiatric/Behavioral:  Positive for depression and substance abuse. Negative for hallucinations, memory loss and suicidal ideas. The patient is nervous/anxious. The patient does not have insomnia.    Blood pressure (!) 118/90, pulse 96, temperature 98.5 F (36.9 C), temperature source Oral, resp. rate 19, SpO2 98%. There is no height or weight on file to calculate BMI.  Demographic Factors:  Low socioeconomic status and Unemployed  Loss Factors: Loss of significant relationship  Historical Factors: Prior suicide attempts  Risk Reduction Factors:   Sense of responsibility to family, Living with another person, especially a relative, Positive social support, Positive therapeutic relationship, and Positive coping skills or problem solving skills  Continued Clinical Symptoms:  Alcohol/Substance Abuse/Dependencies Previous Psychiatric Diagnoses and Treatments  Cognitive Features That Contribute To Risk:  None    Suicide Risk:  Mild:  Suicidal ideation of limited frequency, intensity, duration, and specificity.  There are no identifiable plans, no associated intent, mild dysphoria and related symptoms, good self-control (both objective and subjective assessment), few other risk factors, and identifiable protective factors, including available and accessible social support.  Plan Of Care/Follow-up recommendations: Discussed with patient and his mother Anas Reister). Other:   Call [facility] tomorrow, 08/13/2024 to schedule an appointment for follow-up for continued medication management, counseling, and  to establish [facility] [employer]. Also provided contact information for [facility], which also provides medication management, counseling/outpatient therapy, open access, and ACT team services.  Discharge recommendations:   30 day script provided for 10 mg of zyprexa  by mouth at bedtime. 7 day supply of Zyprexa  provided.  Medications: Patient is to take medications as prescribed. The patient or patient's guardian is to contact a medical professional and/or outpatient provider to address any new side effects that develop. The patient or the patient's guardian should update outpatient providers of any new medications and/or medication changes.   Outpatient Follow up: Please review list of outpatient resources for psychiatry and counseling. Please follow up with your primary care provider for all medical related needs.   Therapy: We recommend that patient participate in individual therapy to address mental health concerns.  Atypical antipsychotics: If you are prescribed an atypical antipsychotic, it is recommended that your height, weight, BMI, blood pressure, fasting lipid panel, and fasting blood sugar be monitored by your outpatient providers.  It was discussed with the patient, the impact of alcohol, drugs, tobacco has on overall psychiatric and medical wellbeing, and total abstinence from substance use was recommended to the patient.  Safety:   The following safety precautions should be taken:   No sharp objects. This  includes scissors, razors, scrapers, and putty knives.   Chemicals should be removed and locked up.   Medications should be removed and locked up.   Weapons should be removed and locked up. This includes firearms, knives and instruments that can be used to cause injury.   The patient should abstain from use of illicit substances/drugs and abuse of any medications.  If symptoms worsen or do not continue to improve or if the patient becomes actively  suicidal or homicidal then it is recommended that the patient return to the closest hospital emergency department, the [facility], or call 911 for further evaluation and treatment. National Suicide Prevention Lifeline 1-800-SUICIDE or 737-172-7065.  About 988 988 offers 24/7 access to trained crisis counselors who can help people experiencing mental health-related distress. People can call or text 988 or chat 988lifeline.org for themselves or if they are worried about a loved one who may need crisis support.   [facility] [address] [place], [place] [phone]  SUBSTANCE USE TREATMENT for Medicaid and State Funded/IPRS  [facility] [address] [place] [address], [place], [address] [phone] phone NOTE: [facility] is no longer offering IOP services.  Serves those who are low-income or have no insurance.  [facility] [address], [place], [place], [address] [phone] phone [phone] fax NOTE: Does have Substance Abuse-Intensive Outpatient Program [facility]) as well as transitional housing if eligible.  [facility] [address] [place], [place], [address] [phone] phone [phone] fax  [facility] [phone] [address] [place], [place], [address] [phone] phone [phone] fax  HALFWAY HOUSES:  [facility] [phone]  [employer].oxfordvacancies.[email or web address]  12 STEP PROGRAMS:  Alcoholics Anonymous of [place] softwarechalet.be  Narcotics Anonymous of  hitprotect.dk  Al-Anon of [place], [place] www.[place]alanon.org/find-meetings.html  Nar-Anon https://nar-anon.org/find-a-meeting  List of Residential placements:   [facility] in [place]: [phone]  [facility]: [phone]  [place], [place] [phone]: Male and male facility; 30-day  program: (uninsured and Medicaid such as [place], [place], [place], partners)  [facility]: [phone]; men and women's facility; 28 days; Can have Medicaid tailored plan [employer] or Partners)  [facility]: [phone] [place] [facility] [place]; 28 day program; must be fully detox; tailored Medicaid or no insurance  [address] in [place], [place]; [phone]; 28 day all males program; no insurance accepted  [facility] Referral in [place]: [place] [phone] (no insurance or Medicaid only); 90 days; outpatient services but provide housing in apartments downtown [place]  [facility] Admission: [phone]: Patient must complete phone screening for placement: [place], [place]; 6 month program; uninsured, Medicaid, and [employer].   [facility]: no insurance required; [phone]  [facility]: [phone]; Intake: [place]; Must fill out application online; Steffan Delay [phone] x [phone] [facility] in [place], [place]: [phone]; Admissions Coordinators Mr. Marinda or Alm Eagles; 90 day program.  [facility]: [place], [place] [phone]; Co-Ed 9 month to a year program; Online application; Men entry fee is $500 (6-65months);  [facility]: [address] 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 Free program; There is an initial $300 fee however, they are willing to work with patients regarding that. Application is  online.  [facility]: Admissions [phone] [place] [phone]; Any 7-90 day program is out of pocket; 12 month program is free of charge; there is a $275 entry fee; Patient is responsible for own transportation   Disposition: Discharge home with mother with plan to follow-up with [facility] for continued medication management, counseling, and initiation of ACT team services.   Jasara Corrigan, NP 08/12/2024, 11:14 AM

## 2024-08-12 NOTE — Group Note (Signed)
 Group Topic: Positive Affirmations  Group Date: 08/12/2024 Start Time: 1100 End Time: 1130 Facilitators: Lonzell Dwayne RAMAN, NT  Department: [facility]  Number of Participants: 4  Group Focus: chemical dependency issues Treatment Modality:  Behavior Modification Therapy Interventions utilized were patient education Purpose: enhance coping skills  Name: Dustin Bennett Date of Birth: [birth year]  MR: [id number]    Level of Participation: Patient did not attend group Quality of Participation: N/A Interactions with others: N/A Mood/Affect: N/A Triggers (if applicable): N/A Cognition: N/A Progress: N/a Response: N/A Plan: N/A  Patients Problems:  Patient Active Problem List   Diagnosis Date Noted   Substance induced mood disorder (HCC) 01/22/2024   Bipolar disorder (HCC) 01/22/2024   Depression 01/22/2024   Amphetamine use 01/22/2024   Cocaine use 01/22/2024   Bipolar disorder (HCC) 12/28/2023   MDD (major depressive disorder) 12/26/2023   Pneumonia 01/16/2020   Syphilis 08/22/2019   MDD (major depressive disorder), recurrent severe, 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

## 2024-08-12 NOTE — ED Notes (Addendum)
 Pt observed putting his mattress on the floor and  then laying on it , then the patient began to talk loudly to himself, but was redirected.  The patient stated, I dont want no medications and you are not re-taking my blood pressure because I am going to leave in the AM to go home and be with my family.  When questioned about who he was talking with, he responded, I was trying to figure some things out, its ok and never mind I've go it all figured out now just leave me alone.  Pt thought blocks and is avoidant but is easily re-directed.  The patient continues to reuse medications and b/p re checks.Pt denies SI and doesn't respond when asked about AVH.  Safety maintained.

## 2024-08-12 NOTE — ED Notes (Signed)
 Patient denies pain and is resting comfortably.

## 2024-08-12 NOTE — ED Notes (Signed)
 Pt is exhibiting hypo manic type behavior, insisting that he be discharged.  Verbalizing with rapid speech, apologizing and then demanding.  Pt demanding to speak with his mother explained to pat time frames and phone usage times.  Pt talking over staff.  Nurse tried to verbally de-escalate pt . Pt unable to stay with a particular theme.  Pt stated that he wanted to speak with his mother, and that had consented for his Mom to call.  Record review revealed that the Pts mom was not listed.  Pt was insistent about calling his MOM.  Nurse p/c the pts Mom and the Mom verified  her identity, . The patient slammed the phone down and went to his room saying, I am still leaving in the morning.  The patient went to his room and laid down and was quiet,

## 2024-08-26 ENCOUNTER — Other Ambulatory Visit: Payer: Self-pay

## 2024-08-26 ENCOUNTER — Emergency Department
Admission: EM | Admit: 2024-08-26 | Discharge: 2024-08-26 | Disposition: A | Discharge status: Home / Self Care | Payer: MEDICAID | Attending: Emergency Medicine | Admitting: Emergency Medicine

## 2024-08-26 ENCOUNTER — Encounter: Payer: Self-pay | Admitting: Emergency Medicine

## 2024-08-26 DIAGNOSIS — S8002XA Contusion of left knee, initial encounter: Secondary | ICD-10-CM | POA: Diagnosis not present

## 2024-08-26 DIAGNOSIS — W000XXA Fall on same level due to ice and snow, initial encounter: Secondary | ICD-10-CM | POA: Diagnosis not present

## 2024-08-26 DIAGNOSIS — Y9301 Activity, walking, marching and hiking: Secondary | ICD-10-CM | POA: Diagnosis not present

## 2024-08-26 DIAGNOSIS — S8001XA Contusion of right knee, initial encounter: Secondary | ICD-10-CM | POA: Insufficient documentation

## 2024-08-26 DIAGNOSIS — S5002XA Contusion of left elbow, initial encounter: Secondary | ICD-10-CM | POA: Diagnosis not present

## 2024-08-26 DIAGNOSIS — S59902A Unspecified injury of left elbow, initial encounter: Secondary | ICD-10-CM | POA: Diagnosis present

## 2024-08-26 DIAGNOSIS — S8000XA Contusion of unspecified knee, initial encounter: Secondary | ICD-10-CM

## 2024-08-26 MED ORDER — IBUPROFEN 600 MG PO TABS
600.0000 mg | ORAL_TABLET | Freq: Once | ORAL | Status: AC
  Administered 2024-08-26: 600 mg via ORAL
  Filled 2024-08-26: qty 1

## 2024-08-26 MED ORDER — TRAMADOL HCL 50 MG PO TABS
50.0000 mg | ORAL_TABLET | Freq: Once | ORAL | Status: AC
  Administered 2024-08-26: 50 mg via ORAL
  Filled 2024-08-26: qty 1

## 2024-08-26 NOTE — ED Notes (Signed)
Pt provided juice and graham crackers. 

## 2024-08-26 NOTE — ED Provider Notes (Signed)
 "  [facility] Provider Note    Event Date/Time   First MD Initiated Contact with Patient 08/26/24 [phone]     (approximate)   History   Fall   HPI  NIVAAN DICENZO is a 39 y.o. male with a history of substance-induced mood disorder who presents with knee pain and left elbow pain after a fall.  The patient states that he was walking in the snow try to get to a tell when he slipped and fell.  He fell on both of his knees and the left elbow.  He has been able to bear weight on the legs since the fall.  He denies other injuries.  I reviewed the past medical records.  The patient was seen by psychiatry on 1/18 with a diagnosis of substance-induced mood disorder.  He was prescribed Zyprexa .  He did not require inpatient admission at that time.   Physical Exam   Triage Vital Signs: ED Triage Vitals  Encounter Vitals Group     BP 08/26/24 0346 (!) 143/94     Girls Systolic BP Percentile --      Girls Diastolic BP Percentile --      Boys Systolic BP Percentile --      Boys Diastolic BP Percentile --      Pulse Rate 08/26/24 0346 95     Resp 08/26/24 0346 18     Temp 08/26/24 0346 98.3 F (36.8 C)     Temp src --      SpO2 08/26/24 0346 99 %     Weight 08/26/24 0346 203 lb (92.1 kg)     Height 08/26/24 0346 5' 11 (1.803 m)     Head Circumference --      Peak Flow --      Pain Score 08/26/24 0345 8     Pain Loc --      Pain Education --      Exclude from Growth Chart --     Most recent vital signs: Vitals:   08/26/24 0346  BP: (!) 143/94  Pulse: 95  Resp: 18  Temp: 98.3 F (36.8 C)  SpO2: 99%    General: Alert, comfortable appearing, no distress.  CV:  Good peripheral perfusion.  Resp:  Normal effort.  Abd:  No distention.  Other:  Full range of motion of bilateral knees with no deformity and no significant tenderness.  Full range of motion to left elbow with no significant tenderness.   ED Results / Procedures / Treatments   Labs (all  labs ordered are listed, but only abnormal results are displayed) Labs Reviewed - No data to display   EKG    RADIOLOGY    PROCEDURES:  Critical Care performed: No  Procedures   MEDICATIONS ORDERED IN ED: Medications  traMADol  (ULTRAM ) tablet 50 mg (50 mg Oral Given 08/26/24 0402)  ibuprofen  (ADVIL ) tablet 600 mg (600 mg Oral Given 08/26/24 0402)     IMPRESSION / MDM / ASSESSMENT AND PLAN / ED COURSE  I reviewed the triage vital signs and the nursing notes.  Differential diagnosis includes, but is not limited to, contusion, muscle strain.  Patient's presentation is most consistent with acute, uncomplicated illness.  Based on exam, there is no indication for x-rays or other imaging.  I feel that there is likely a component of secondary gain as the patient immediately asked for food and seemed very happy when I advised him that given the current snowy weather, I was fine with  him staying in the ED for a couple of hours.  ----------------------------------------- 5:50 AM on 08/26/2024 -----------------------------------------  The patient states he is feeling much better and requests discharge.SABRA  He is stable for discharge at this time.  Return precautions provided, and he expressed understanding.   FINAL CLINICAL IMPRESSION(S) / ED DIAGNOSES   Final diagnoses:  Contusion of knee, unspecified laterality, initial encounter  Contusion of left elbow, initial encounter     Rx / DC Orders   ED Discharge Orders     None        Note:  This document was prepared using Dragon voice recognition software and may include unintentional dictation errors.    Jacolyn Pae, MD 08/26/24 [phone]  "

## 2024-08-26 NOTE — ED Notes (Signed)
 Reviewed D/C information with the patient, pt verbalized understanding. No additional concerns at this time.

## 2024-08-26 NOTE — ED Triage Notes (Signed)
 Pt presents to the ED via [facility] with complaints of bilateral knee pain and L elbow following a fall tonight. Rates the pain 8/10. Pt immediately asking for pain medication and food upon arrival. A&Ox4 at this time. Denies CP or SOB.
